# Patient Record
Sex: Male | Born: 1950 | Race: Black or African American | Hispanic: No | Marital: Single | Smoking: Former smoker
Health system: Southern US, Community
[De-identification: ages and names within clinical notes are randomized; demographics above are authoritative.]

## PROBLEM LIST (undated history)

## (undated) DIAGNOSIS — N1831 Chronic kidney disease, stage 3a: Secondary | ICD-10-CM

## (undated) DIAGNOSIS — I255 Ischemic cardiomyopathy: Secondary | ICD-10-CM

## (undated) DIAGNOSIS — I340 Nonrheumatic mitral (valve) insufficiency: Secondary | ICD-10-CM

## (undated) DIAGNOSIS — Z720 Tobacco use: Secondary | ICD-10-CM

## (undated) DIAGNOSIS — Z95810 Presence of automatic (implantable) cardiac defibrillator: Secondary | ICD-10-CM

## (undated) DIAGNOSIS — I519 Heart disease, unspecified: Secondary | ICD-10-CM

## (undated) DIAGNOSIS — I2510 Atherosclerotic heart disease of native coronary artery without angina pectoris: Secondary | ICD-10-CM

## (undated) DIAGNOSIS — B1920 Unspecified viral hepatitis C without hepatic coma: Secondary | ICD-10-CM

## (undated) DIAGNOSIS — I5020 Unspecified systolic (congestive) heart failure: Secondary | ICD-10-CM

## (undated) DIAGNOSIS — E119 Type 2 diabetes mellitus without complications: Secondary | ICD-10-CM

## (undated) HISTORY — PX: CIRCUMCISION: SUR203

## (undated) HISTORY — DX: Unspecified systolic (congestive) heart failure: I50.20

## (undated) HISTORY — DX: Heart disease, unspecified: I51.9

## (undated) HISTORY — DX: Unspecified viral hepatitis C without hepatic coma: B19.20

## (undated) HISTORY — DX: Atherosclerotic heart disease of native coronary artery without angina pectoris: I25.10

## (undated) HISTORY — DX: Nonrheumatic mitral (valve) insufficiency: I34.0

## (undated) HISTORY — DX: Tobacco use: Z72.0

## (undated) HISTORY — DX: Chronic kidney disease, stage 3a: N18.31

## (undated) HISTORY — DX: Type 2 diabetes mellitus without complications: E11.9

## (undated) HISTORY — DX: Ischemic cardiomyopathy: I25.5

---

## 2001-03-22 ENCOUNTER — Emergency Department (HOSPITAL_COMMUNITY): Admission: EM | Admit: 2001-03-22 | Discharge: 2001-03-22 | Payer: Self-pay | Admitting: Emergency Medicine

## 2001-03-22 ENCOUNTER — Encounter: Payer: Self-pay | Admitting: Emergency Medicine

## 2008-02-28 ENCOUNTER — Emergency Department: Payer: Self-pay | Admitting: Emergency Medicine

## 2009-09-26 ENCOUNTER — Ambulatory Visit: Payer: Self-pay | Admitting: General Practice

## 2011-10-20 ENCOUNTER — Encounter (HOSPITAL_COMMUNITY): Payer: Self-pay | Admitting: *Deleted

## 2011-10-20 ENCOUNTER — Emergency Department (HOSPITAL_COMMUNITY)
Admission: EM | Admit: 2011-10-20 | Discharge: 2011-10-20 | Disposition: A | Discharge status: Home / Self Care | Payer: 59 | Attending: Emergency Medicine | Admitting: Emergency Medicine

## 2011-10-20 ENCOUNTER — Emergency Department (HOSPITAL_COMMUNITY): Payer: 59

## 2011-10-20 DIAGNOSIS — R509 Fever, unspecified: Secondary | ICD-10-CM | POA: Insufficient documentation

## 2011-10-20 DIAGNOSIS — J40 Bronchitis, not specified as acute or chronic: Secondary | ICD-10-CM

## 2011-10-20 DIAGNOSIS — E119 Type 2 diabetes mellitus without complications: Secondary | ICD-10-CM | POA: Insufficient documentation

## 2011-10-20 MED ORDER — HYDROCOD POLST-CHLORPHEN POLST 10-8 MG/5ML PO LQCR
5.0000 mL | Freq: Once | ORAL | Status: AC
  Administered 2011-10-20: 5 mL via ORAL

## 2011-10-20 MED ORDER — HYDROCOD POLST-CHLORPHEN POLST 10-8 MG/5ML PO LQCR
ORAL | Status: AC
  Administered 2011-10-20: 5 mL via ORAL
  Filled 2011-10-20: qty 5

## 2011-10-20 MED ORDER — GUAIFENESIN-CODEINE 100-10 MG/5ML PO SYRP: ORAL_SOLUTION | ORAL | Status: DC

## 2011-10-20 NOTE — ED Notes (Addendum)
Pt c/o chills, fever, cough with sputum production with no color, no appetite, sore throat, and right side lower back pain,  pt states that he started getting sick week ago,

## 2011-10-20 NOTE — Discharge Instructions (Signed)
Bronchitis Bronchitis is the body's way of reacting to injury and/or infection (inflammation) of the bronchi. Bronchi are the air tubes that extend from the windpipe into the lungs. If the inflammation becomes severe, it may cause shortness of breath. CAUSES  Inflammation may be caused by:  A virus.   Germs (bacteria).   Dust.   Allergens.   Pollutants and many other irritants.  The cells lining the bronchial tree are covered with tiny hairs (cilia). These constantly beat upward, away from the lungs, toward the mouth. This keeps the lungs free of pollutants. When these cells become too irritated and are unable to do their job, mucus begins to develop. This causes the characteristic cough of bronchitis. The cough clears the lungs when the cilia are unable to do their job. Without either of these protective mechanisms, the mucus would settle in the lungs. Then you would develop pneumonia. Smoking is a common cause of bronchitis and can contribute to pneumonia. Stopping this habit is the single most important thing you can do to help yourself. TREATMENT   Your caregiver may prescribe an antibiotic if the cough is caused by bacteria. Also, medicines that open up your airways make it easier to breathe. Your caregiver may also recommend or prescribe an expectorant. It will loosen the mucus to be coughed up. Only take over-the-counter or prescription medicines for pain, discomfort, or fever as directed by your caregiver.   Removing whatever causes the problem (smoking, for example) is critical to preventing the problem from getting worse.   Cough suppressants may be prescribed for relief of cough symptoms.   Inhaled medicines may be prescribed to help with symptoms now and to help prevent problems from returning.   For those with recurrent (chronic) bronchitis, there may be a need for steroid medicines.  SEEK IMMEDIATE MEDICAL CARE IF:   During treatment, you develop more pus-like mucus  (purulent sputum).   You have a fever.   Your baby is older than 3 months with a rectal temperature of 102 F (38.9 C) or higher.   Your baby is 80 months old or younger with a rectal temperature of 100.4 F (38 C) or higher.   You become progressively more ill.   You have increased difficulty breathing, wheezing, or shortness of breath.  It is necessary to seek immediate medical care if you are elderly or sick from any other disease. MAKE SURE YOU:   Understand these instructions.   Will watch your condition.   Will get help right away if you are not doing well or get worse.  Document Released: 07/07/2005 Document Revised: 06/26/2011 Document Reviewed: 05/16/2008 [facility] Patient Information 2012 [place], [place].   The chest x-ray shows no signs of pneumonia.  Clinically you have bronchitis.  Take the cough medicine as directed.  Make an appt for follow up at the [facility] hospital.  Return here if your symptoms worsen or change in the meantime.

## 2011-10-20 NOTE — ED Provider Notes (Signed)
History/physical exam/procedure(s) were performed by non-physician practitioner and as supervising physician I was immediately available for consultation/collaboration. I have reviewed all notes and am in agreement with care and plan.   Hilario Quarry, MD 10/20/11 [phone]

## 2011-10-20 NOTE — ED Provider Notes (Signed)
History     CSN: [id number]  Arrival date & time 10/20/11  0930   First MD Initiated Contact with Patient 10/20/11 [phone]      Chief Complaint  Patient presents with  . Chills  . Cough  . Fever    (Consider location/radiation/quality/duration/timing/severity/associated sxs/prior treatment) HPI Comments: Cough with white sputum ~ 2 weeks.  Has not seen PCP at [facility] since last year.  Patient is a 61 y.o. male presenting with cough and fever. The history is provided by the patient. No language interpreter was used.  Cough This is a new problem. The problem occurs every few minutes. The problem has not changed since onset.The cough is productive of sputum. 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 His past medical history does not include COPD or emphysema.  Fever Primary symptoms of the febrile illness include fever and cough.    Past Medical History  Diagnosis Date  . Diabetes mellitus     History reviewed. No pertinent past surgical history.  No family history on file.  History  Substance Use Topics  . Smoking status: Current Some Day Smoker  . Smokeless tobacco: Not on file  . Alcohol Use: Yes     occassionally       Review of Systems  Constitutional: Positive for fever.  Respiratory: Positive for cough.     Allergies  Review of patient's allergies indicates no known allergies.  Home Medications   Current Outpatient Rx  Name Route Sig Dispense Refill  . INSULIN ISOPHANE & REGULAR (70-30) 100 UNIT/ML [place] SUSP Subcutaneous Inject 20-25 Units into the skin 2 (two) times daily with a meal.    . PHENYLEPH-CPM-DM-APAP 11-19-08-325 MG PO CAPS Oral Take 2 tablets by mouth every 6 (six) hours as needed. FOR COLD SYMPTOMS     . PSEUDOEPH-DOXYLAMINE-DM-APAP 60-7.12-17-998 MG/30ML PO LIQD Oral Take by mouth.      Pulse 93  Temp 98.6 F (37 C)  Resp 20  Ht 5\' 11"  (1.803 m)  Wt 180 lb (81.647 kg)  BMI 25.10 kg/m2  SpO2 97%  Physical Exam  Nursing  note and vitals reviewed. Constitutional: He is oriented to person, place, and time. He appears well-developed and well-nourished.  HENT:  Head: Normocephalic and atraumatic.  Eyes: EOM are normal.  Neck: Normal range of motion.  Cardiovascular: Normal rate, regular rhythm, normal heart sounds and intact distal pulses.   Pulmonary/Chest: Effort normal and breath sounds normal. No accessory muscle usage. Not tachypneic. No respiratory distress. He has no decreased breath sounds. He has no wheezes. He has no rhonchi. He has no rales. He exhibits no tenderness.  Abdominal: Soft. He exhibits no distension. There is no tenderness.  Musculoskeletal: Normal range of motion.  Neurological: He is alert and oriented to person, place, and time.  Skin: Skin is warm and dry.  Psychiatric: He has a normal mood and affect. Judgment normal.    ED Course  Procedures (including critical care time)  Labs Reviewed - No data to display Dg Chest 2 View  10/20/2011  *RADIOLOGY REPORT*  Clinical Data: Productive cough.  Fever.  Smoker.  Diabetes.  CHEST - 2 VIEW  Comparison: None.  Findings: No infiltrate, congestive heart failure or pneumothorax. Mild prominence right hilar region appears vascular in origin.  If the patient had a persistent unexplained cough, further imaging (CT) may be considered in this patient with history of smoking.  Heart size within normal limits.  Minimally tortuous aorta.  IMPRESSION: No  infiltrate or congestive heart failure.  Please see above.  Original Report Authenticated By: Fuller Canada, M.D.     No diagnosis found.    MDM  rx robitussin AC F/u with PCP Return if sxs worsen in meantime.        Worthy Rancher, PA 10/20/11 [phone]

## 2011-10-20 NOTE — ED Notes (Signed)
Pt DC to home with steady gait 

## 2014-03-23 ENCOUNTER — Encounter (INDEPENDENT_AMBULATORY_CARE_PROVIDER_SITE_OTHER): Payer: Self-pay | Admitting: *Deleted

## 2014-10-06 ENCOUNTER — Encounter (INDEPENDENT_AMBULATORY_CARE_PROVIDER_SITE_OTHER): Payer: Self-pay | Admitting: *Deleted

## 2014-10-26 ENCOUNTER — Ambulatory Visit (INDEPENDENT_AMBULATORY_CARE_PROVIDER_SITE_OTHER): Payer: Self-pay | Admitting: Internal Medicine

## 2014-10-27 ENCOUNTER — Ambulatory Visit (INDEPENDENT_AMBULATORY_CARE_PROVIDER_SITE_OTHER): Payer: Self-pay | Admitting: Internal Medicine

## 2014-11-10 ENCOUNTER — Encounter (INDEPENDENT_AMBULATORY_CARE_PROVIDER_SITE_OTHER): Payer: Self-pay | Admitting: *Deleted

## 2014-11-10 ENCOUNTER — Encounter (INDEPENDENT_AMBULATORY_CARE_PROVIDER_SITE_OTHER): Payer: Self-pay | Admitting: Internal Medicine

## 2014-11-10 ENCOUNTER — Ambulatory Visit (INDEPENDENT_AMBULATORY_CARE_PROVIDER_SITE_OTHER): Payer: 59 | Admitting: Internal Medicine

## 2014-11-10 VITALS — BP 124/78 | HR 80 | Temp 97.0°F | Ht 70.0 in | Wt 173.3 lb

## 2014-11-10 DIAGNOSIS — B1920 Unspecified viral hepatitis C without hepatic coma: Secondary | ICD-10-CM | POA: Insufficient documentation

## 2014-11-10 DIAGNOSIS — E119 Type 2 diabetes mellitus without complications: Secondary | ICD-10-CM | POA: Insufficient documentation

## 2014-11-10 LAB — CBC WITH DIFFERENTIAL/PLATELET
BASOS PCT: 0 % (ref 0–1)
Basophils Absolute: 0 10*3/uL (ref 0.0–0.1)
EOS ABS: 0.1 10*3/uL (ref 0.0–0.7)
Eosinophils Relative: 2 % (ref 0–5)
HEMATOCRIT: 42.8 % (ref 39.0–52.0)
Hemoglobin: 15.4 g/dL (ref 13.0–17.0)
Lymphocytes Relative: 53 % — ABNORMAL HIGH (ref 12–46)
Lymphs Abs: 3.8 10*3/uL (ref 0.7–4.0)
MCH: 32.7 pg (ref 26.0–34.0)
MCHC: 36 g/dL (ref 30.0–36.0)
MCV: 90.9 fL (ref 78.0–100.0)
MONO ABS: 0.6 10*3/uL (ref 0.1–1.0)
MONOS PCT: 9 % (ref 3–12)
MPV: 10.3 fL (ref 8.6–12.4)
NEUTROS PCT: 36 % — AB (ref 43–77)
Neutro Abs: 2.6 10*3/uL (ref 1.7–7.7)
Platelets: 202 10*3/uL (ref 150–400)
RBC: 4.71 MIL/uL (ref 4.22–5.81)
RDW: 13.4 % (ref 11.5–15.5)
WBC: 7.1 10*3/uL (ref 4.0–10.5)

## 2014-11-10 LAB — PROTIME-INR
INR: 0.93 (ref <1.50)
Prothrombin Time: 12.5 seconds (ref 11.6–15.2)

## 2014-11-10 NOTE — Progress Notes (Signed)
   Subjective:    Patient ID: Jose Clark, male    DOB: May 05, 1951, 64 y.o.   MRN: [id number]  HPI 64 yr old black male referred by [facility] by  Dr. Leonie Douglas for Hep C treatment.  Diagnosed with Hepatitis C seven years ago at the [facility] in [place]. He did start tx seven years ago but stopped because of depression. In the 70s, he did IV drugs. No tattoos.  He denies hx of jaundice. Appetite is good. No weight loss. There is no abdominal pain. Usually has a BM once a day. No melena or BRRB. Diabetic x 20 yrs.  03/22/2014 Hep C antibody reactive Hep C viral RNA qualitative detected. WBC 4.8, RBC 5.20, H and H 16.8 and 50.5, MCV 97.1, Albumin 4.4, Bilirubin total 0.7, ALP 77, AST 32, ALT 40. HA1C 10.9  Have you ever been treated for Hepatitis C?  Yes, but stopped tx due to depression Any hx of IV drug abuse or drug abuse? In the 70s.  No drugs in over 30 years.  Are you drinking now? In a weeks time, 4-5 beer Any hx of etoh abuse?  no Do you have tattoos?no Have you ever received a blood transfusion? no When were you diagnosed with Hepatitis C? 2009 Any hx of mental illness requiring treatment? no Do you have suicidal thoughts? no  Review of Systems Divorced. 4 children in good health.     Past Medical History  Diagnosis Date  . Diabetes mellitus   . Hepatitis C   . Diabetes     x 20 yrs    No past surgical history on file.  No Known Allergies  Current Outpatient Prescriptions on File Prior to Visit  Medication Sig Dispense Refill  . insulin NPH-insulin regular (NOVOLIN 70/30) (70-30) 100 UNIT/ML injection Inject 20-25 Units into the skin 2 (two) times daily with a meal.     No current facility-administered medications on file prior to visit.     Objective:   Physical Exam Blood pressure 124/78, pulse 80, temperature 97 F (36.1 C), height 5\' 10"  (1.778 m), weight 173 lb 4.8 oz (78.608 kg).  Alert and oriented. Skin warm and dry. Oral  mucosa is moist.   . Sclera anicteric, conjunctivae is pink. Thyroid not enlarged. No cervical lymphadenopathy. Lungs clear. Heart regular rate and rhythm.  Abdomen is soft. Bowel sounds are positive. No hepatomegaly. No abdominal masses felt. No tenderness.  No edema to lower extremities.         Assessment & Plan:  Hepatitis C. Transaminases are normal.   Desires tx. CBC with diff, Hepatic function, PT/INR, TSH, Hep C quaint, Hep C genotype, [place] elastrography

## 2014-11-10 NOTE — Patient Instructions (Signed)
Labs. OV in 4 weeks

## 2014-11-11 LAB — TSH: TSH: 1.271 u[IU]/mL (ref 0.350–4.500)

## 2014-11-11 LAB — HEPATIC FUNCTION PANEL
ALK PHOS: 61 U/L (ref 39–117)
ALT: 25 U/L (ref 0–53)
AST: 23 U/L (ref 0–37)
Albumin: 3.6 g/dL (ref 3.5–5.2)
BILIRUBIN INDIRECT: 0.2 mg/dL (ref 0.2–1.2)
BILIRUBIN TOTAL: 0.3 mg/dL (ref 0.2–1.2)
Bilirubin, Direct: 0.1 mg/dL (ref 0.0–0.3)
Total Protein: 6.3 g/dL (ref 6.0–8.3)

## 2014-11-13 LAB — HEPATITIS C RNA QUANTITATIVE
HCV QUANT: 7686655 [IU]/mL — AB (ref <15)
HCV Quantitative Log: 6.89 {Log} — ABNORMAL HIGH (ref <1.18)

## 2014-11-15 ENCOUNTER — Telehealth (INDEPENDENT_AMBULATORY_CARE_PROVIDER_SITE_OTHER): Payer: Self-pay | Admitting: Internal Medicine

## 2014-11-15 ENCOUNTER — Ambulatory Visit (HOSPITAL_COMMUNITY)
Admission: RE | Admit: 2014-11-15 | Discharge: 2014-11-15 | Disposition: A | Discharge status: Home / Self Care | Payer: 59 | Source: Ambulatory Visit | Attending: Internal Medicine | Admitting: Internal Medicine

## 2014-11-15 DIAGNOSIS — B1920 Unspecified viral hepatitis C without hepatic coma: Secondary | ICD-10-CM | POA: Insufficient documentation

## 2014-11-15 LAB — HEPATITIS C GENOTYPE

## 2014-11-15 NOTE — Telephone Encounter (Signed)
Jose Clark, Results given to patient. We are going to treat with Harvoni x 12 weeks. Insurance may only pay for 8 weeks. Genotype 1A.

## 2014-11-16 NOTE — Telephone Encounter (Signed)
Paper work and PA has been sent to [employer] for assistance with PA for Harvoini.

## 2014-11-17 ENCOUNTER — Telehealth (INDEPENDENT_AMBULATORY_CARE_PROVIDER_SITE_OTHER): Payer: Self-pay | Admitting: Internal Medicine

## 2014-11-17 DIAGNOSIS — B1920 Unspecified viral hepatitis C without hepatic coma: Secondary | ICD-10-CM

## 2014-11-17 NOTE — Telephone Encounter (Signed)
I have spoken with patient. He will go to lab for urine drug screen.

## 2014-11-17 NOTE — Telephone Encounter (Signed)
error 

## 2014-11-21 ENCOUNTER — Telehealth (INDEPENDENT_AMBULATORY_CARE_PROVIDER_SITE_OTHER): Payer: Self-pay | Admitting: Internal Medicine

## 2014-11-21 LAB — DRUG SCREEN, URINE
Amphetamine Screen, Ur: NEGATIVE
BENZODIAZEPINES.: NEGATIVE
Barbiturate Quant, Ur: NEGATIVE
COCAINE METABOLITES: POSITIVE — AB
CREATININE, U: 408.78 mg/dL
Marijuana Metabolite: NEGATIVE
Methadone: NEGATIVE
OPIATES: NEGATIVE
PHENCYCLIDINE (PCP): NEGATIVE
Propoxyphene: NEGATIVE

## 2014-11-21 NOTE — Telephone Encounter (Signed)
Message left

## 2014-11-23 ENCOUNTER — Encounter (INDEPENDENT_AMBULATORY_CARE_PROVIDER_SITE_OTHER): Payer: Self-pay | Admitting: *Deleted

## 2015-05-25 ENCOUNTER — Ambulatory Visit (INDEPENDENT_AMBULATORY_CARE_PROVIDER_SITE_OTHER): Payer: 59 | Admitting: Internal Medicine

## 2015-05-28 ENCOUNTER — Encounter (INDEPENDENT_AMBULATORY_CARE_PROVIDER_SITE_OTHER): Payer: Self-pay | Admitting: Internal Medicine

## 2015-05-28 ENCOUNTER — Ambulatory Visit (INDEPENDENT_AMBULATORY_CARE_PROVIDER_SITE_OTHER): Payer: 59 | Admitting: Internal Medicine

## 2015-05-28 VITALS — BP 102/54 | HR 64 | Temp 98.1°F | Ht 71.0 in | Wt 181.5 lb

## 2015-05-28 DIAGNOSIS — B1710 Acute hepatitis C without hepatic coma: Secondary | ICD-10-CM

## 2015-05-28 NOTE — Patient Instructions (Signed)
OV pending.  

## 2015-05-28 NOTE — Progress Notes (Addendum)
Subjective:    Patient ID: Jose Clark, male    DOB: 1950/08/23, 64 y.o.   MRN: [id number]  HPI Here today for f/u of his Hepatitis C. He was last seen in April of this year.  Referred by Dr. Leonie Douglas of [facility] for Hep C. Tx. Diagnosed 7 years ago at the [place] [facility] in [place].  He did start tx seven years ago but stopped due to depression.  States he has never did IV drugs at this office visit. . No tattoos.  Genotype 1A.  At office visit he tested positive for cocaine. He was put on hold for 6 months.  11/15/2014 [place] Elast: F2 F3 Appetite is good.  No weight loss. No nausea or vomiting. Usually has a BM daily.   Has not received the Hepatitis A or B vaccine.   Drugs of Abuse     Component Value Date/Time   LABOPIA NEG 11/20/2014 0939   COCAINSCRNUR POS* 11/20/2014 0939   LABBENZ NEG 11/20/2014 0939   AMPHETMU NEG 11/20/2014 0939      CBC    Component Value Date/Time   WBC 7.1 11/10/2014 0934   RBC 4.71 11/10/2014 0934   HGB 15.4 11/10/2014 0934   HCT 42.8 11/10/2014 0934   PLT 202 11/10/2014 0934   MCV 90.9 11/10/2014 0934   MCH 32.7 11/10/2014 0934   MCHC 36.0 11/10/2014 0934   RDW 13.4 11/10/2014 0934   LYMPHSABS 3.8 11/10/2014 0934   MONOABS 0.6 11/10/2014 0934   EOSABS 0.1 11/10/2014 0934   BASOSABS 0.0 11/10/2014 0934    Hepatic Function Panel     Component Value Date/Time   PROT 6.3 11/10/2014 0934   ALBUMIN 3.6 11/10/2014 0934   AST 23 11/10/2014 0934   ALT 25 11/10/2014 0934   ALKPHOS 61 11/10/2014 0934   BILITOT 0.3 11/10/2014 0934   BILIDIR 0.1 11/10/2014 0934   IBILI 0.2 11/10/2014 0934         03/22/2014 Hep C antibody reactive Hep C viral RNA qualitative detected. WBC 4.8, RBC 5.20, H and H 16.8 and 50.5, MCV 97.1, Albumin 4.4, Bilirubin total 0.7, ALP 77, AST 32, ALT 40. HA1C 10.9  Have you ever been treated for Hepatitis C? Yes, but stopped tx due to depression Any hx of IV drug abuse or drug abuse? In  the 70s. No drugs in over 30 years. At this OV 05/28/2015 he denies IV drug abuse. Are you drinking now? In a weeks time, 4-5 beer Any hx of etoh abuse? no Do you have tattoos?no Have you ever received a blood transfusion? no When were you diagnosed with Hepatitis C? 2009 Any hx of mental illness requiring treatment? no Do you have suicidal thoughts? no      Review of Systems Past Medical History  Diagnosis Date  . Diabetes mellitus   . Hepatitis C   . Diabetes ([place])     x 20 yrs    No past surgical history on file.  No Known Allergies  Current Outpatient Prescriptions on File Prior to Visit  Medication Sig Dispense Refill  . insulin NPH-insulin regular (NOVOLIN 70/30) (70-30) 100 UNIT/ML injection Inject 20-25 Units into the skin 2 (two) times daily with a meal.    . lisinopril (PRINIVIL,ZESTRIL) 5 MG tablet Take 5 mg by mouth daily.    . metFORMIN (GLUCOPHAGE) 500 MG tablet Take by mouth 2 (two) times daily with a meal.     No current facility-administered medications  on file prior to visit.        Objective:   Physical Exam Blood pressure 102/54, pulse 64, temperature 98.1 F (36.7 C), height 5\' 11"  (1.803 m), weight 181 lb 8 oz (82.328 kg).  Alert and oriented. Skin warm and dry. Oral mucosa is moist.   . Sclera anicteric, conjunctivae is pink. Thyroid not enlarged. No cervical lymphadenopathy. Lungs clear. Heart regular rate and rhythm.  Abdomen is soft. Bowel sounds are positive. No hepatomegaly. No abdominal masses felt. No tenderness.  No edema to lower extremities.        Assessment & Plan:  Hepatitis C. Will get a Hep C quaint.and Urine drug screen. Further recommendations to follow.  Rx for Hep A an B vaccine given to patient. OV in 3 months.

## 2015-05-29 LAB — DRUG SCREEN, URINE
Amphetamine Screen, Ur: NEGATIVE
BENZODIAZEPINES.: NEGATIVE
Barbiturate Quant, Ur: NEGATIVE
CREATININE, U: 229.32 mg/dL
Cocaine Metabolites: NEGATIVE
Marijuana Metabolite: NEGATIVE
Methadone: NEGATIVE
Opiates: NEGATIVE
PHENCYCLIDINE (PCP): NEGATIVE
PROPOXYPHENE: NEGATIVE

## 2015-05-29 LAB — HEPATITIS C RNA QUANTITATIVE
HCV QUANT LOG: 6.79 {Log} — AB (ref <1.18)
HCV QUANT: 6112923 [IU]/mL — AB (ref <15)

## 2015-05-30 ENCOUNTER — Telehealth (INDEPENDENT_AMBULATORY_CARE_PROVIDER_SITE_OTHER): Payer: Self-pay | Admitting: Internal Medicine

## 2015-05-30 NOTE — Telephone Encounter (Signed)
Jose Clark, We are going to treat with Harvoni x 12 weeks. Hep C .patient

## 2015-05-31 NOTE — Telephone Encounter (Signed)
Paper work has been completed and will be faxed to the [facility] for assistance with the PA for HEP C Medication. Patient will be made aware of the status as we are.

## 2015-06-25 ENCOUNTER — Telehealth (INDEPENDENT_AMBULATORY_CARE_PROVIDER_SITE_OTHER): Payer: Self-pay | Admitting: *Deleted

## 2015-06-25 NOTE — Telephone Encounter (Signed)
Patient called in stating he still has not received his medication in the mail.  Would like for you to call him please

## 2015-06-25 NOTE — Telephone Encounter (Signed)
I spoke with patient. May take insurance co up to 6 weeks.

## 2015-06-27 ENCOUNTER — Encounter (INDEPENDENT_AMBULATORY_CARE_PROVIDER_SITE_OTHER): Payer: Self-pay | Admitting: *Deleted

## 2015-07-02 ENCOUNTER — Telehealth (INDEPENDENT_AMBULATORY_CARE_PROVIDER_SITE_OTHER): Payer: Self-pay | Admitting: *Deleted

## 2015-07-02 NOTE — Telephone Encounter (Signed)
Rec'd  Prior Authorization is approved through 09/17/2015. PA was completed through [place].They have sent the prescription to the dispensing pharmacy , [facility]. I called to verify that they had rec'd it and that it was valid. As sometime they refer to it as a 3rd party and perfer either a verbal or a written prescription.  The representative ask that we call back in 24 hours as she could see that the pharamacy was putting it in. It was noted that it might be Wednesday before we could follow up.

## 2015-07-05 NOTE — Telephone Encounter (Signed)
Talked with the [place], they stated that the Harvoni is ready to be shipped out to the patient. They are going to call him or he could call them. I spoke with the patient and provided the number , [phone] , for him to call. He states that he is going to call them and he will let us know when he gets the medication/starts so that we can arrange his office appointments and labs.

## 2015-07-24 ENCOUNTER — Encounter (INDEPENDENT_AMBULATORY_CARE_PROVIDER_SITE_OTHER): Payer: Self-pay | Admitting: *Deleted

## 2015-07-24 ENCOUNTER — Telehealth (INDEPENDENT_AMBULATORY_CARE_PROVIDER_SITE_OTHER): Payer: Self-pay | Admitting: Internal Medicine

## 2015-07-24 DIAGNOSIS — B182 Chronic viral hepatitis C: Secondary | ICD-10-CM

## 2015-07-24 NOTE — Telephone Encounter (Signed)
[name] called saying he received his medication and will begin taking it today. He said you told him to let you know of this.  Pt's ph# [phone] Thank you.

## 2015-07-24 NOTE — Telephone Encounter (Signed)
Needs OV in 4 weeks.  Jose Clark, CBC, Hepatic function, Hep C quaint in 4 weeks.

## 2015-07-24 NOTE — Telephone Encounter (Signed)
Labs are noted to be drawn on or around 08/21/2015. Letter has been sent to the patient as a reminder.

## 2015-07-25 NOTE — Telephone Encounter (Signed)
appt 08/28/15

## 2015-08-22 ENCOUNTER — Telehealth (INDEPENDENT_AMBULATORY_CARE_PROVIDER_SITE_OTHER): Payer: Self-pay | Admitting: Internal Medicine

## 2015-08-22 NOTE — Telephone Encounter (Signed)
Please send this to Tammy, I am not familiar with these pharmacy so she can call him.

## 2015-08-22 NOTE — Telephone Encounter (Signed)
Mr. Jose Clark called saying he took his last pill (wasn't sure of the name of the medication but said it was for Hepatitis C) on Saturday. He needs a refill. He said he thinks he uses the mail order service pharmacy for [facility] [employer]. Please give him a call regarding this if needed.  Pt's ph# [phone] Thank you.

## 2015-08-28 ENCOUNTER — Ambulatory Visit (INDEPENDENT_AMBULATORY_CARE_PROVIDER_SITE_OTHER): Payer: 59 | Admitting: Internal Medicine

## 2015-08-30 NOTE — Telephone Encounter (Signed)
Message left on answering machine 

## 2015-08-30 NOTE — Telephone Encounter (Signed)
This is for Jose Clark 

## 2015-08-30 NOTE — Telephone Encounter (Signed)
Jose Clark - I sent this to you. Wanted to keep you informed on this patient's status.

## 2015-08-30 NOTE — Telephone Encounter (Signed)
[place], spoke with Amy. She states that the 1 shipment went out in December. There is a Rx awaiting to be sent to the patient and he needs to call them [phone]. His current PA is approved through 09/17/2015. Amy says it looks like on her end , that he has had 56 days worth. Also , the patient has not had his labs drawn, they were due 07/23/2015. A letter was sent to him as reminder.  I have called the patient and a detailed message was left about his medication and the labs. He was ask to call our office to verify he got this message. Forwarded to [place] for review.

## 2015-09-03 ENCOUNTER — Ambulatory Visit (INDEPENDENT_AMBULATORY_CARE_PROVIDER_SITE_OTHER): Payer: 59 | Admitting: Internal Medicine

## 2015-09-12 ENCOUNTER — Ambulatory Visit (INDEPENDENT_AMBULATORY_CARE_PROVIDER_SITE_OTHER): Payer: 59 | Admitting: Internal Medicine

## 2015-09-12 ENCOUNTER — Encounter (INDEPENDENT_AMBULATORY_CARE_PROVIDER_SITE_OTHER): Payer: Self-pay | Admitting: Internal Medicine

## 2015-09-12 VITALS — BP 126/74 | HR 64 | Temp 98.1°F | Ht 71.0 in | Wt 186.9 lb

## 2015-09-12 DIAGNOSIS — B179 Acute viral hepatitis, unspecified: Secondary | ICD-10-CM

## 2015-09-12 LAB — CBC
HCT: 43.4 % (ref 39.0–52.0)
HEMOGLOBIN: 14.9 g/dL (ref 13.0–17.0)
MCH: 32.3 pg (ref 26.0–34.0)
MCHC: 34.3 g/dL (ref 30.0–36.0)
MCV: 94.1 fL (ref 78.0–100.0)
MPV: 10 fL (ref 8.6–12.4)
Platelets: 173 10*3/uL (ref 150–400)
RBC: 4.61 MIL/uL (ref 4.22–5.81)
RDW: 13.1 % (ref 11.5–15.5)
WBC: 5.2 10*3/uL (ref 4.0–10.5)

## 2015-09-12 NOTE — Progress Notes (Signed)
   Subjective:    Patient ID: Jose Clark, male    DOB: 09-21-1950, 65 y.o.   MRN: [id number]  HPI Here today for f/u of his Hepatitis C. He started [facility] 07/24/2015. This is week 5 for him. He had a lapse of 2 weeks due to not calling the pharmacy. He did have his lab drawn this morning before coming into office. He is Genotype 1A.  Referred by [facility] by Dr. Leonie Douglas for Hep C treatment.  Diagnosed with Hepatitis C seven years ago at the [facility] in [place]. He did start tx seven years ago but stopped because of depression. In the 70s, he did IV drugs. No tattoos.  He denies hx of jaundice.  Rx for Hepatitis A and B given to him at last OV but he has not started. He will take the Rx to [facility] next OV. 11/15/2014 [place] elast. Corresponding Metavir fibrosis score: F2/F3 Diabetic x 20 years. Blood sugars running around 90-130 He does occasionally have headaches since starting  Harvoni.  Appetite is good. He has gained about 6 pounds since November. BMs are normal. No melena or BRRB.   Review of Systems Past Medical History  Diagnosis Date  . Diabetes mellitus   . Hepatitis C   . Diabetes ([place])     x 20 yrs    No past surgical history on file.  No Known Allergies  Past Medical History  Diagnosis Date  . Diabetes mellitus   . Hepatitis C   . Diabetes ([place])     x 20 yrs    No past surgical history on file.  No Known Allergies  Current Outpatient Prescriptions on File Prior to Visit  Medication Sig Dispense Refill  . insulin NPH-insulin regular (NOVOLIN 70/30) (70-30) 100 UNIT/ML injection Inject 20-25 Units into the skin 2 (two) times daily with a meal.    . lisinopril (PRINIVIL,ZESTRIL) 5 MG tablet Take 5 mg by mouth daily.     No current facility-administered medications on file prior to visit.  Harvoni one a day.             Physical Exam  Blood pressure 126/74, pulse 64, temperature 98.1 F (36.7 C),  height 5\' 11"  (1.803 m), weight 186 lb 14.4 oz (84.777 kg). Alert and oriented. Skin warm and dry. Oral mucosa is moist.   . Sclera anicteric, conjunctivae is pink. Thyroid not enlarged. No cervical lymphadenopathy. Lungs clear. Heart regular rate and rhythm.  Abdomen is soft. Bowel sounds are positive. No hepatomegaly. No abdominal masses felt. No tenderness.  No edema to lower extremities. Patient is alert and oriented.        Assessment & Plan:  Hepatitis C. OV in 8 weeks with a CBC, Hepatic function, and Hep C quaint.  Will call with results of his lab when they are ready.

## 2015-09-12 NOTE — Patient Instructions (Signed)
OV in 8 weeks with labs. Go to lab 3 days before OV.

## 2015-09-13 LAB — HEPATIC FUNCTION PANEL
ALK PHOS: 69 U/L (ref 40–115)
ALT: 16 U/L (ref 9–46)
AST: 20 U/L (ref 10–35)
Albumin: 3.6 g/dL (ref 3.6–5.1)
BILIRUBIN DIRECT: 0.1 mg/dL (ref <=0.2)
Indirect Bilirubin: 0.3 mg/dL (ref 0.2–1.2)
Total Bilirubin: 0.4 mg/dL (ref 0.2–1.2)
Total Protein: 6.4 g/dL (ref 6.1–8.1)

## 2015-09-14 LAB — HEPATITIS C RNA QUANTITATIVE
HCV Quantitative Log: 4.47 {Log} — ABNORMAL HIGH (ref <1.18)
HCV Quantitative: 29800 IU/mL — ABNORMAL HIGH (ref <15)

## 2015-09-17 ENCOUNTER — Other Ambulatory Visit (INDEPENDENT_AMBULATORY_CARE_PROVIDER_SITE_OTHER): Payer: Self-pay | Admitting: *Deleted

## 2015-09-17 ENCOUNTER — Encounter (INDEPENDENT_AMBULATORY_CARE_PROVIDER_SITE_OTHER): Payer: Self-pay | Admitting: *Deleted

## 2015-09-17 ENCOUNTER — Telehealth (INDEPENDENT_AMBULATORY_CARE_PROVIDER_SITE_OTHER): Payer: Self-pay | Admitting: *Deleted

## 2015-09-17 DIAGNOSIS — B182 Chronic viral hepatitis C: Secondary | ICD-10-CM

## 2015-09-17 NOTE — Telephone Encounter (Signed)
.  Per Lelon Perla patient to have lab work in 3 weeks.

## 2015-10-09 ENCOUNTER — Telehealth (INDEPENDENT_AMBULATORY_CARE_PROVIDER_SITE_OTHER): Payer: Self-pay | Admitting: Internal Medicine

## 2015-10-09 NOTE — Telephone Encounter (Signed)
Jose Clark called saying he needs a prior authorization for Harvoni. Please send information to his insurance carrier and let him know when it's been completed. He wants the medication mailed to his home.   Pt's ph# [phone] Thank you.

## 2015-10-17 NOTE — Telephone Encounter (Signed)
Jose Clark , from [employer] has resubmitted a PA for a 4 week continuation of the Hep C Medication. We will be made aware of the outcome.

## 2015-10-17 NOTE — Telephone Encounter (Signed)
I have reached out to [employer] for this patient.

## 2015-10-18 ENCOUNTER — Encounter (INDEPENDENT_AMBULATORY_CARE_PROVIDER_SITE_OTHER): Payer: Self-pay | Admitting: *Deleted

## 2015-10-30 ENCOUNTER — Telehealth (INDEPENDENT_AMBULATORY_CARE_PROVIDER_SITE_OTHER): Payer: Self-pay | Admitting: *Deleted

## 2015-10-30 NOTE — Telephone Encounter (Signed)
Patient was called and made aware that we had rec'd a fax from [facility]. It states that the medication was previously approved. You will be able to fill a prescription for this medication at your pharmacy. If your pharmacy has questions reqarding the prescription of your prescription,please have them call [facility] pharmacy help desk at [phone].  He says that he will call them as they deliver his medication to him.

## 2015-11-12 ENCOUNTER — Ambulatory Visit (INDEPENDENT_AMBULATORY_CARE_PROVIDER_SITE_OTHER): Payer: 59 | Admitting: Internal Medicine

## 2015-11-14 ENCOUNTER — Encounter (INDEPENDENT_AMBULATORY_CARE_PROVIDER_SITE_OTHER): Payer: Self-pay | Admitting: *Deleted

## 2015-11-14 ENCOUNTER — Encounter (INDEPENDENT_AMBULATORY_CARE_PROVIDER_SITE_OTHER): Payer: Self-pay | Admitting: Internal Medicine

## 2015-11-14 ENCOUNTER — Other Ambulatory Visit (INDEPENDENT_AMBULATORY_CARE_PROVIDER_SITE_OTHER): Payer: Self-pay | Admitting: *Deleted

## 2015-11-14 ENCOUNTER — Ambulatory Visit (INDEPENDENT_AMBULATORY_CARE_PROVIDER_SITE_OTHER): Payer: 59 | Admitting: Internal Medicine

## 2015-11-14 ENCOUNTER — Other Ambulatory Visit (INDEPENDENT_AMBULATORY_CARE_PROVIDER_SITE_OTHER): Payer: Self-pay | Admitting: Internal Medicine

## 2015-11-14 VITALS — BP 118/80 | HR 76 | Temp 98.0°F | Ht 71.0 in | Wt 184.0 lb

## 2015-11-14 DIAGNOSIS — Z1211 Encounter for screening for malignant neoplasm of colon: Secondary | ICD-10-CM

## 2015-11-14 DIAGNOSIS — B1920 Unspecified viral hepatitis C without hepatic coma: Secondary | ICD-10-CM | POA: Diagnosis not present

## 2015-11-14 LAB — CBC WITH DIFFERENTIAL/PLATELET
BASOS PCT: 0 %
Basophils Absolute: 0 cells/uL (ref 0–200)
EOS ABS: 122 {cells}/uL (ref 15–500)
EOS PCT: 2 %
HCT: 43.2 % (ref 38.5–50.0)
Hemoglobin: 14.8 g/dL (ref 13.2–17.1)
LYMPHS PCT: 48 %
Lymphs Abs: 2928 cells/uL (ref 850–3900)
MCH: 32.2 pg (ref 27.0–33.0)
MCHC: 34.3 g/dL (ref 32.0–36.0)
MCV: 93.9 fL (ref 80.0–100.0)
MONOS PCT: 9 %
MPV: 9.5 fL (ref 7.5–12.5)
Monocytes Absolute: 549 cells/uL (ref 200–950)
NEUTROS ABS: 2501 {cells}/uL (ref 1500–7800)
Neutrophils Relative %: 41 %
PLATELETS: 175 10*3/uL (ref 140–400)
RBC: 4.6 MIL/uL (ref 4.20–5.80)
RDW: 13.6 % (ref 11.0–15.0)
WBC: 6.1 10*3/uL (ref 3.8–10.8)

## 2015-11-14 LAB — HEPATIC FUNCTION PANEL
ALBUMIN: 3.7 g/dL (ref 3.6–5.1)
ALK PHOS: 70 U/L (ref 40–115)
ALT: 12 U/L (ref 9–46)
AST: 12 U/L (ref 10–35)
BILIRUBIN INDIRECT: 0.2 mg/dL (ref 0.2–1.2)
BILIRUBIN TOTAL: 0.3 mg/dL (ref 0.2–1.2)
Bilirubin, Direct: 0.1 mg/dL (ref <=0.2)
TOTAL PROTEIN: 6.9 g/dL (ref 6.1–8.1)

## 2015-11-14 MED ORDER — PEG 3350-KCL-NA BICARB-NACL 420 G PO SOLR: 4000.0000 mL | Freq: Once | ORAL | Status: DC

## 2015-11-14 NOTE — Patient Instructions (Addendum)
Labs and [place]. OV in 6 months.  Screening colonoscopy. The risks and benefits such as perforation, bleeding, and infection were reviewed with the patient and is agreeable.

## 2015-11-14 NOTE — Progress Notes (Signed)
   Subjective:    Patient ID: Jose Clark, male    DOB: July 27, 1950, 65 y.o.   MRN: [id number] Patient states he is not hard to sedate. Has not did cocaine in over 6 months.  HPI  Here today for f/u of his Hepatitis C. Genotype 1A. Started Harvoni 07/24/2015.  He had a lapse in tx due to not calling pharmacy for refill on Harvoni. He says he has about 1 week left on the Harvoni.  Diagnosed with Hepatitis C  seven years ago at the [place] [facility] in [place].  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 Ix of IV drugs. Rx for Hepatitis A and B given to patient.  09/12/2015 Hep C quaint 2800 11/15/2014 [place] elast. Corresponding Metavir fibrosis score:  F2/F3 He tells me he is doing good. He is not doing cocaine. Appetite is good. He has gained 3 pounds. BMs are normal. No side effects from the [place]. Patient also states it has been greater than 10 yrs for his colonoscopy. Normal per patient. Done at the [place] in [facility].    Review of Systems Past Medical History  Diagnosis Date  . Diabetes mellitus   . Hepatitis C   . Diabetes ([place])     x 20 yrs    No past surgical history on file.  No Known Allergies  Current Outpatient Prescriptions on File Prior to Visit  Medication Sig Dispense Refill  . insulin NPH-insulin regular (NOVOLIN 70/30) (70-30) 100 UNIT/ML injection Inject 20-25 Units into the skin 2 (two) times daily with a meal.    . Ledipasvir-Sofosbuvir (HARVONI PO) Take by mouth.    [name] Kitchen lisinopril (PRINIVIL,ZESTRIL) 5 MG tablet Take 5 mg by mouth daily.     No current facility-administered medications on file prior to visit.        Objective:   Physical Exam Blood pressure 118/80, pulse 76, temperature 98 F (36.7 C), height 5\' 11"  (1.803 m), weight 184 lb (83.462 kg).  Alert and oriented. Skin warm and dry. Oral mucosa is moist.   . Sclera anicteric, conjunctivae is pink. Thyroid not enlarged. No cervical lymphadenopathy. Lungs clear. Heart regular rate and rhythm.  Abdomen is soft. Bowel  sounds are positive. No hepatomegaly. No abdominal masses felt. No tenderness.  No edema to lower extremities.         Assessment & Plan:  Hepatitis C. He has not cleared the virus as of last week. Will check Hepatitis C quaint, Hepatic function and CBC [place] RUQ.  Rx for Hepatitis A and B given to patient (patient lost first Rx given to him). Screening colonoscopy. The risks and benefits such as perforation, bleeding, and infection were reviewed with the patient and is agreeable.

## 2015-11-14 NOTE — Telephone Encounter (Signed)
Patient needs trilyte 

## 2015-11-15 LAB — HEPATITIS C RNA QUANTITATIVE: HCV Quantitative: NOT DETECTED IU/mL (ref <15)

## 2015-11-16 ENCOUNTER — Ambulatory Visit (HOSPITAL_COMMUNITY)
Admission: RE | Admit: 2015-11-16 | Discharge: 2015-11-16 | Disposition: A | Discharge status: Home / Self Care | Payer: 59 | Source: Ambulatory Visit | Attending: Internal Medicine | Admitting: Internal Medicine

## 2015-11-16 DIAGNOSIS — B1920 Unspecified viral hepatitis C without hepatic coma: Secondary | ICD-10-CM | POA: Insufficient documentation

## 2016-01-24 ENCOUNTER — Encounter (HOSPITAL_COMMUNITY)
Admission: RE | Disposition: A | Discharge status: Home / Self Care | Payer: Self-pay | Source: Ambulatory Visit | Attending: Internal Medicine

## 2016-01-24 ENCOUNTER — Encounter (HOSPITAL_COMMUNITY): Payer: Self-pay

## 2016-01-24 ENCOUNTER — Ambulatory Visit (HOSPITAL_COMMUNITY)
Admission: RE | Admit: 2016-01-24 | Discharge: 2016-01-24 | Disposition: A | Discharge status: Home / Self Care | Payer: 59 | Source: Ambulatory Visit | Attending: Internal Medicine | Admitting: Internal Medicine

## 2016-01-24 DIAGNOSIS — D123 Benign neoplasm of transverse colon: Secondary | ICD-10-CM | POA: Insufficient documentation

## 2016-01-24 DIAGNOSIS — E119 Type 2 diabetes mellitus without complications: Secondary | ICD-10-CM | POA: Insufficient documentation

## 2016-01-24 DIAGNOSIS — Z1211 Encounter for screening for malignant neoplasm of colon: Secondary | ICD-10-CM | POA: Insufficient documentation

## 2016-01-24 DIAGNOSIS — Z794 Long term (current) use of insulin: Secondary | ICD-10-CM | POA: Diagnosis not present

## 2016-01-24 DIAGNOSIS — K644 Residual hemorrhoidal skin tags: Secondary | ICD-10-CM | POA: Insufficient documentation

## 2016-01-24 DIAGNOSIS — K5730 Diverticulosis of large intestine without perforation or abscess without bleeding: Secondary | ICD-10-CM | POA: Diagnosis not present

## 2016-01-24 DIAGNOSIS — B1920 Unspecified viral hepatitis C without hepatic coma: Secondary | ICD-10-CM | POA: Diagnosis not present

## 2016-01-24 DIAGNOSIS — Z79899 Other long term (current) drug therapy: Secondary | ICD-10-CM | POA: Diagnosis not present

## 2016-01-24 HISTORY — PX: COLONOSCOPY: SHX5424

## 2016-01-24 LAB — GLUCOSE, CAPILLARY: GLUCOSE-CAPILLARY: 150 mg/dL — AB (ref 65–99)

## 2016-01-24 SURGERY — COLONOSCOPY
Anesthesia: Moderate Sedation

## 2016-01-24 MED ORDER — MIDAZOLAM HCL 5 MG/5ML IJ SOLN
INTRAMUSCULAR | Status: AC
  Filled 2016-01-24: qty 10

## 2016-01-24 MED ORDER — MEPERIDINE HCL 50 MG/ML IJ SOLN
INTRAMUSCULAR | Status: DC | PRN
  Administered 2016-01-24 (×2): 25 mg via INTRAVENOUS

## 2016-01-24 MED ORDER — SODIUM CHLORIDE 0.9 % IV SOLN
INTRAVENOUS | Status: DC
  Administered 2016-01-24: 09:00:00 via INTRAVENOUS

## 2016-01-24 MED ORDER — MEPERIDINE HCL 50 MG/ML IJ SOLN
INTRAMUSCULAR | Status: AC
  Filled 2016-01-24: qty 1

## 2016-01-24 MED ORDER — STERILE WATER FOR IRRIGATION IR SOLN
Status: DC | PRN
  Administered 2016-01-24: 10:00:00

## 2016-01-24 MED ORDER — MIDAZOLAM HCL 5 MG/5ML IJ SOLN
INTRAMUSCULAR | Status: DC | PRN
  Administered 2016-01-24: 2 mg via INTRAVENOUS
  Administered 2016-01-24: 3 mg via INTRAVENOUS

## 2016-01-24 NOTE — Discharge Instructions (Signed)
Resume usual medications and high fiber diet. No driving for 24 hours. Physician will call with biopsy results. Colonoscopy, Care After Refer to this sheet in the next few weeks. These instructions provide you with information on caring for yourself after your procedure. Your health care provider may also give you more specific instructions. Your treatment has been planned according to current medical practices, but problems sometimes occur. Call your health care provider if you have any problems or questions after your procedure. WHAT TO EXPECT AFTER THE PROCEDURE  After your procedure, it is typical to have the following:  A small amount of blood in your stool.  Moderate amounts of gas and mild abdominal cramping or bloating. HOME CARE INSTRUCTIONS  Do not drive, operate machinery, or sign important documents for 24 hours.  You may shower and resume your regular physical activities, but move at a slower pace for the first 24 hours.  Take frequent rest periods for the first 24 hours.  Walk around or put a warm pack on your abdomen to help reduce abdominal cramping and bloating.  Drink enough fluids to keep your urine clear or pale yellow.  You may resume your normal diet as instructed by your health care provider. Avoid heavy or fried foods that are hard to digest.  Avoid drinking alcohol for 24 hours or as instructed by your health care provider.  Only take over-the-counter or prescription medicines as directed by your health care provider.  If a tissue sample (biopsy) was taken during your procedure:  Do not take aspirin or blood thinners for 7 days, or as instructed by your health care provider.  Do not drink alcohol for 7 days, or as instructed by your health care provider.  Eat soft foods for the first 24 hours. SEEK MEDICAL CARE IF: You have persistent spotting of blood in your stool 2-3 days after the procedure. SEEK IMMEDIATE MEDICAL CARE IF:  You have more than a  small spotting of blood in your stool.  You pass large blood clots in your stool.  Your abdomen is swollen (distended).  You have nausea or vomiting.  You have a fever.  You have increasing abdominal pain that is not relieved with medicine.   This information is not intended to replace advice given to you by your health care provider. Make sure you discuss any questions you have with your health care provider.   Document Released: 02/19/2004 Document Revised: 04/27/2013 Document Reviewed: 03/14/2013 Elsevier Interactive Patient Education 2016 Elsevier Inc. High-Fiber Diet Fiber, also called dietary fiber, is a type of carbohydrate found in fruits, vegetables, whole grains, and beans. A high-fiber diet can have many health benefits. Your health care provider may recommend a high-fiber diet to help:  Prevent constipation. Fiber can make your bowel movements more regular.  Lower your cholesterol.  Relieve hemorrhoids, uncomplicated diverticulosis, or irritable bowel syndrome.  Prevent overeating as part of a weight-loss plan.  Prevent heart disease, type 2 diabetes, and certain cancers. WHAT IS MY PLAN? The recommended daily intake of fiber includes:  38 grams for men under age 63.  63 grams for men over age 106.  56 grams for women under age 38.  29 grams for women over age 20. You can get the recommended daily intake of dietary fiber by eating a variety of fruits, vegetables, grains, and beans. Your health care provider may also recommend a fiber supplement if it is not possible to get enough fiber through your diet. WHAT DO I NEED  TO KNOW ABOUT A HIGH-FIBER DIET?  Fiber supplements have not been widely studied for their effectiveness, so it is better to get fiber through food sources.  Always check the fiber content on thenutrition facts label of any prepackaged food. Look for foods that contain at least 5 grams of fiber per serving.  Ask your dietitian if you have  questions about specific foods that are related to your condition, especially if those foods are not listed in the following section.  Increase your daily fiber consumption gradually. Increasing your intake of dietary fiber too quickly may cause bloating, cramping, or gas.  Drink plenty of water. Water helps you to digest fiber. WHAT FOODS CAN I EAT? Grains Whole-grain breads. Multigrain cereal. Oats and oatmeal. Brown rice. Barley. Bulgur wheat. [employer]. Bran muffins. Popcorn. Rye wafer crackers. Vegetables Sweet potatoes. Spinach. Kale. Artichokes. Cabbage. Broccoli. Green peas. Carrots. Squash. Fruits Berries. Pears. Apples. Oranges. Avocados. Prunes and raisins. Dried figs. Meats and Other Protein Sources [place], kidney, pinto, and soy beans. Split peas. Lentils. Nuts and seeds. Dairy Fiber-fortified yogurt. Beverages Fiber-fortified soy milk. Fiber-fortified orange juice. Other Fiber bars. The items listed above may not be a complete list of recommended foods or beverages. Contact your dietitian for more options. WHAT FOODS ARE NOT RECOMMENDED? Grains White bread. Pasta made with refined flour. White rice. Vegetables Fried potatoes. Canned vegetables. Well-cooked vegetables.  Fruits Fruit juice. Cooked, strained fruit. Meats and Other Protein Sources Fatty cuts of meat. Fried Sales executive or fried fish. Dairy Milk. Yogurt. Cream cheese. Sour cream. Beverages Soft drinks. Other Cakes and pastries. Butter and oils. The items listed above may not be a complete list of foods and beverages to avoid. Contact your dietitian for more information. WHAT ARE SOME TIPS FOR INCLUDING HIGH-FIBER FOODS IN MY DIET?  Eat a wide variety of high-fiber foods.  Make sure that half of all grains consumed each day are whole grains.  Replace breads and cereals made from refined flour or white flour with whole-grain breads and cereals.  Replace white rice with brown rice, bulgur wheat, or  millet.  Start the day with a breakfast that is high in fiber, such as a cereal that contains at least 5 grams of fiber per serving.  Use beans in place of meat in soups, salads, or pasta.  Eat high-fiber snacks, such as berries, raw vegetables, nuts, or popcorn.   This information is not intended to replace advice given to you by your health care provider. Make sure you discuss any questions you have with your health care provider.   Document Released: 07/07/2005 Document Revised: 07/28/2014 Document Reviewed: 12/20/2013 Elsevier Interactive Patient Education [employer].

## 2016-01-24 NOTE — Op Note (Signed)
[facility] Patient Name: Jose Clark Procedure Date: 01/24/2016 9:18 AM MRN: [id number] Date of Birth: 1950/10/28 Attending MD: Hildred Laser , MD CSN: [id number] Age: 65 Admit Type: Inpatient Procedure:                Colonoscopy Indications:              Screening for colorectal malignant neoplasm Providers:                Hildred Laser, MD, Lurline Del, RN, Isabella Stalling,                            Technician Referring MD:             [facility] Medicines:                Meperidine 50 mg IV, Midazolam 5 mg IV Complications:            No immediate complications. Estimated Blood Loss:     Estimated blood loss was minimal. Procedure:                Pre-Anesthesia Assessment:                           - Prior to the procedure, a History and Physical                            was performed, and patient medications and                            allergies were reviewed. The patient's tolerance of                            previous anesthesia was also reviewed. The risks                            and benefits of the procedure and the sedation                            options and risks were discussed with the patient.                            All questions were answered, and informed consent                            was obtained. Prior Anticoagulants: The patient has                            taken no previous anticoagulant or antiplatelet                            agents. ASA Grade Assessment: II - A patient with                            mild systemic disease. After reviewing the risks  and benefits, the patient was deemed in                            satisfactory condition to undergo the procedure.                           After obtaining informed consent, the colonoscope                            was passed under direct vision. Throughout the                            procedure, the patient's blood pressure, pulse, and                             oxygen saturations were monitored continuously. The                            [id number]) scope was introduced through                            the anus and advanced to the the cecum, identified                            by appendiceal orifice and ileocecal valve. The                            colonoscopy was performed without difficulty. The                            patient tolerated the procedure well. colowrap was                            used for the procedure The quality of the bowel                            preparation was adequate to identify polyps 6 mm                            and larger in size. The ileocecal valve,                            appendiceal orifice, and rectum were photographed. Scope In: 9:37:11 AM Scope Out: 9:54:55 AM Scope Withdrawal Time: 0 hours 12 minutes 24 seconds  Total Procedure Duration: 0 hours 17 minutes 44 seconds  Findings:      A 4 mm polyp was found in the splenic flexure. The polyp was sessile.       Biopsies were taken with a cold forceps for histology.      A few medium-mouthed diverticula were found in the sigmoid colon and       hepatic flexure.      External hemorrhoids were found during retroflexion. The hemorrhoids       were small. Impression:               -  One 4 mm polyp at the splenic flexure. Biopsied.                           - Diverticulosis in the sigmoid colon and at the                            hepatic flexure.                           - External hemorrhoids. Moderate Sedation:      Moderate (conscious) sedation was administered by the endoscopy nurse       and supervised by the endoscopist. The following parameters were       monitored: oxygen saturation, heart rate, blood pressure, CO2       capnography and response to care. Total physician intraservice time was       21 minutes. Recommendation:           - Patient has a contact number available for                             emergencies. The signs and symptoms of potential                            delayed complications were discussed with the                            patient. Return to normal activities tomorrow.                            Written discharge instructions were provided to the                            patient.                           - High fiber diet today.                           - Continue present medications.                           - Await pathology results.                           - Repeat colonoscopy for surveillance based on                            pathology results. Procedure Code(s):        --- Professional ---                           [phone], Colonoscopy, flexible; with biopsy, single                            or multiple  [id number], Moderate sedation services provided by the                            same physician or other qualified health care                            professional performing the diagnostic or                            therapeutic service that the sedation supports,                            requiring the presence of an independent trained                            observer to assist in the monitoring of the                            patient's level of consciousness and physiological                            status; initial 15 minutes of intraservice time,                            patient age 20 years or older Diagnosis Code(s):        --- Professional ---                           Z12.11, Encounter for screening for malignant                            neoplasm of colon                           D12.3, Benign neoplasm of transverse colon (hepatic                            flexure or splenic flexure)                           K64.4, Residual hemorrhoidal skin tags                           K57.30, Diverticulosis of large intestine without                            perforation or abscess without bleeding CPT  copyright 2016 American Medical Association. All rights reserved. The codes documented in this report are preliminary and upon coder review may  be revised to meet current compliance requirements. Hildred Laser, MD Hildred Laser, MD 01/24/2016 10:03:34 AM This report has been signed electronically. Number of Addenda: 0

## 2016-01-24 NOTE — H&P (Signed)
Jose Clark is an 65 y.o. male.   Chief Complaint: Patient is here for colonoscopy. HPI: Patient is 65 year old African-American male was here for screening colonoscopy. He denies abdominal pain change in bowel habits or rectal bleeding. Family History is negative for CRC.  Past Medical History  Diagnosis Date  . Diabetes mellitus   . Hepatitis C   . Diabetes ([place])     x 20 yrs    Past Surgical History  Procedure Laterality Date  . Circumcision      30 years ago    No family history on file. Social History:  reports that he has been smoking.  He does not have any smokeless tobacco history on file. He reports that he drinks alcohol. He reports that he uses illicit drugs.  Allergies: No Known Allergies  Medications Prior to Admission  Medication Sig Dispense Refill  . insulin NPH-insulin regular (NOVOLIN 70/30) (70-30) 100 UNIT/ML injection Inject 20-25 Units into the skin 2 (two) times daily with a meal.    . polyethylene glycol-electrolytes (NULYTELY/GOLYTELY) 420 g solution Take 4,000 mLs by mouth once. 4000 mL 0    Results for orders placed or performed during the hospital encounter of 01/24/16 (from the past 48 hour(s))  Glucose, capillary     Status: Abnormal   Collection Time: 01/24/16  9:08 AM  Result Value Ref Range   Glucose-Capillary 150 (H) 65 - 99 mg/dL   No results found.  ROS  Blood pressure 126/83, pulse 91, temperature 97.8 F (36.6 C), temperature source Oral, resp. rate 22, height 5\' 11"  (1.803 m), weight 180 lb (81.647 kg), SpO2 100 %. Physical Exam  Constitutional: He appears well-developed and well-nourished.  HENT:  Mouth/Throat: Oropharynx is clear and moist.  Eyes: Conjunctivae are normal. No scleral icterus.  Neck: No thyromegaly present.  Cardiovascular: Normal rate, regular rhythm and normal heart sounds.   No murmur heard. Respiratory: Effort normal and breath sounds normal.  GI: He exhibits no distension and no mass. There is no  tenderness.  Musculoskeletal: He exhibits no edema.  Lymphadenopathy:    He has no cervical adenopathy.  Neurological: He is alert.  Skin: Skin is warm and dry.     Assessment/Plan Average risk screening colonoscopy.  Hildred Laser, MD 01/24/2016, 9:28 AM

## 2016-01-29 ENCOUNTER — Encounter (HOSPITAL_COMMUNITY): Payer: Self-pay | Admitting: Internal Medicine

## 2016-05-14 ENCOUNTER — Ambulatory Visit (INDEPENDENT_AMBULATORY_CARE_PROVIDER_SITE_OTHER): Payer: 59 | Admitting: Internal Medicine

## 2016-05-15 ENCOUNTER — Ambulatory Visit (INDEPENDENT_AMBULATORY_CARE_PROVIDER_SITE_OTHER): Payer: 59 | Admitting: Internal Medicine

## 2016-06-04 ENCOUNTER — Ambulatory Visit (INDEPENDENT_AMBULATORY_CARE_PROVIDER_SITE_OTHER): Payer: 59 | Admitting: Internal Medicine

## 2016-06-23 ENCOUNTER — Encounter (INDEPENDENT_AMBULATORY_CARE_PROVIDER_SITE_OTHER): Payer: Self-pay

## 2016-06-23 ENCOUNTER — Ambulatory Visit (INDEPENDENT_AMBULATORY_CARE_PROVIDER_SITE_OTHER): Payer: 59 | Admitting: Internal Medicine

## 2016-06-23 ENCOUNTER — Encounter (INDEPENDENT_AMBULATORY_CARE_PROVIDER_SITE_OTHER): Payer: Self-pay | Admitting: Internal Medicine

## 2017-08-11 DIAGNOSIS — E119 Type 2 diabetes mellitus without complications: Secondary | ICD-10-CM | POA: Diagnosis not present

## 2017-08-24 DIAGNOSIS — E119 Type 2 diabetes mellitus without complications: Secondary | ICD-10-CM | POA: Diagnosis not present

## 2017-08-26 DIAGNOSIS — Z0001 Encounter for general adult medical examination with abnormal findings: Secondary | ICD-10-CM | POA: Diagnosis not present

## 2017-08-26 DIAGNOSIS — E119 Type 2 diabetes mellitus without complications: Secondary | ICD-10-CM | POA: Diagnosis not present

## 2017-09-23 DIAGNOSIS — E119 Type 2 diabetes mellitus without complications: Secondary | ICD-10-CM | POA: Diagnosis not present

## 2017-09-23 DIAGNOSIS — Z6824 Body mass index (BMI) 24.0-24.9, adult: Secondary | ICD-10-CM | POA: Diagnosis not present

## 2017-09-23 DIAGNOSIS — R809 Proteinuria, unspecified: Secondary | ICD-10-CM | POA: Diagnosis not present

## 2017-09-23 DIAGNOSIS — R945 Abnormal results of liver function studies: Secondary | ICD-10-CM | POA: Diagnosis not present

## 2018-08-19 ENCOUNTER — Emergency Department (HOSPITAL_COMMUNITY): Payer: Medicare Other

## 2018-08-19 ENCOUNTER — Other Ambulatory Visit: Payer: Self-pay

## 2018-08-19 ENCOUNTER — Emergency Department (HOSPITAL_COMMUNITY)
Admission: EM | Admit: 2018-08-19 | Discharge: 2018-08-19 | Disposition: A | Discharge status: Home / Self Care | Payer: Medicare Other | Attending: Emergency Medicine | Admitting: Emergency Medicine

## 2018-08-19 ENCOUNTER — Encounter (HOSPITAL_COMMUNITY): Payer: Self-pay | Admitting: Emergency Medicine

## 2018-08-19 DIAGNOSIS — Z794 Long term (current) use of insulin: Secondary | ICD-10-CM | POA: Diagnosis not present

## 2018-08-19 DIAGNOSIS — R739 Hyperglycemia, unspecified: Secondary | ICD-10-CM

## 2018-08-19 DIAGNOSIS — E1165 Type 2 diabetes mellitus with hyperglycemia: Secondary | ICD-10-CM | POA: Insufficient documentation

## 2018-08-19 DIAGNOSIS — R0789 Other chest pain: Secondary | ICD-10-CM | POA: Diagnosis not present

## 2018-08-19 DIAGNOSIS — R059 Cough, unspecified: Secondary | ICD-10-CM

## 2018-08-19 DIAGNOSIS — F17210 Nicotine dependence, cigarettes, uncomplicated: Secondary | ICD-10-CM | POA: Diagnosis not present

## 2018-08-19 DIAGNOSIS — R05 Cough: Secondary | ICD-10-CM | POA: Diagnosis not present

## 2018-08-19 DIAGNOSIS — R0602 Shortness of breath: Secondary | ICD-10-CM | POA: Diagnosis not present

## 2018-08-19 DIAGNOSIS — R079 Chest pain, unspecified: Secondary | ICD-10-CM | POA: Diagnosis present

## 2018-08-19 LAB — CBG MONITORING, ED: GLUCOSE-CAPILLARY: 262 mg/dL — AB (ref 70–99)

## 2018-08-19 MED ORDER — BENZONATATE 200 MG PO CAPS: 200.0000 mg | ORAL_CAPSULE | Freq: Three times a day (TID) | ORAL | 0 refills | Status: DC | PRN

## 2018-08-19 MED ORDER — AZITHROMYCIN 250 MG PO TABS: ORAL_TABLET | ORAL | 0 refills | Status: DC

## 2018-08-19 NOTE — ED Provider Notes (Signed)
[facility] EMERGENCY DEPARTMENT Provider Note   CSN: [id number] Arrival date & time: 08/19/18  1224     History   Chief Complaint Chief Complaint  Patient presents with  . chest congestion    HPI Jose Clark is a 68 y.o. male.  HPI  Jose Clark is a 68 y.o. male with hx of DM and hep C, who presents to the Emergency Department complaining of chest and nasal congestion, cough, and chills.  Symptoms present for 3 days.  He describes his cough as productive of white, thick sputum and associated with chest tightness when cough is excessive.  He denies fever, chest pain, peripheral edema and shortness of breath.  He has tried OTC cough medication without relief.  No known sick contacts.    Past Medical History:  Diagnosis Date  . Diabetes ([place])    x 20 yrs  . Diabetes mellitus   . Hepatitis C     Patient Active Problem List   Diagnosis Date Noted  . Diabetes ([place]) 11/10/2014  . Hepatitis C 11/10/2014    Past Surgical History:  Procedure Laterality Date  . CIRCUMCISION     30 years ago  . COLONOSCOPY N/A 01/24/2016   Procedure: COLONOSCOPY;  Surgeon: Rogene Houston, MD;  Location: [facility] SUITE;  Service: Endoscopy;  Laterality: N/A;  1130      Home Medications    Prior to Admission medications   Medication Sig Start Date End Date Taking? Authorizing Provider  insulin NPH-insulin regular (NOVOLIN 70/30) (70-30) 100 UNIT/ML injection Inject 20-25 Units into the skin 2 (two) times daily with a meal.    [provider]    Family History No family history on file.  Social History Social History   Tobacco Use  . Smoking status: Current Some Day Smoker  . Smokeless tobacco: Never Used  . Tobacco comment: 1 pack every couple of weeks x 10 year.  Substance Use Topics  . Alcohol use: Yes    Alcohol/week: 0.0 standard drinks    Comment: occassionally  does not drink everyday. Occasional beer.  . Drug use: Not Currently    Comment: cocaine      Allergies   Patient has no known allergies.   Review of Systems Review of Systems  Constitutional: Positive for chills. Negative for appetite change and fever.  HENT: Positive for congestion. Negative for sore throat and trouble swallowing.   Respiratory: Positive for cough and chest tightness. Negative for shortness of breath and wheezing.   Cardiovascular: Negative for chest pain and leg swelling.  Gastrointestinal: Negative for abdominal pain, nausea and vomiting.  Genitourinary: Negative for dysuria.  Musculoskeletal: Negative for arthralgias and myalgias.  Skin: Negative for rash.  Neurological: Negative for dizziness, weakness and numbness.  Hematological: Negative for adenopathy.     Physical Exam Updated Vital Signs BP 114/77 (BP Location: Right Arm)   Pulse 90   Temp 97.7 F (36.5 C) (Oral)   Resp 12   Ht 5\' 9"  (1.753 m)   Wt 81.6 kg   SpO2 98%   BMI 26.58 kg/m   Physical Exam Vitals signs and nursing note reviewed.  Constitutional:      General: He is not in acute distress.    Appearance: Normal appearance. He is well-developed.  HENT:     Head: Normocephalic.     Right Ear: Tympanic membrane and ear canal normal.     Left Ear: Tympanic membrane and ear canal normal.  Mouth/Throat:     Mouth: Mucous membranes are moist.     Pharynx: Oropharynx is clear. Uvula midline. No oropharyngeal exudate or posterior oropharyngeal erythema.  Eyes:     Pupils: Pupils are equal, round, and reactive to light.  Neck:     Musculoskeletal: Full passive range of motion without pain, normal range of motion and neck supple.     Trachea: Phonation normal.  Cardiovascular:     Rate and Rhythm: Normal rate and regular rhythm.     Heart sounds: Normal heart sounds. No murmur.  Pulmonary:     Effort: Pulmonary effort is normal. No respiratory distress.     Breath sounds: No stridor. No wheezing or rales.  Chest:     Chest wall: No tenderness.  Abdominal:      General: There is no distension.     Palpations: Abdomen is soft.     Tenderness: There is no abdominal tenderness.  Musculoskeletal:     Right lower leg: No edema.     Left lower leg: No edema.  Lymphadenopathy:     Cervical: No cervical adenopathy.  Skin:    General: Skin is warm.     Capillary Refill: Capillary refill takes less than 2 seconds.     Findings: No rash.  Neurological:     General: No focal deficit present.     Mental Status: He is alert.     Sensory: No sensory deficit.     Motor: No weakness or abnormal muscle tone.     Coordination: Coordination normal.  Psychiatric:        Mood and Affect: Mood normal.      ED Treatments / Results  Labs (all labs ordered are listed, but only abnormal results are displayed) Labs Reviewed  CBG MONITORING, ED - Abnormal; Notable for the following components:      Result Value   Glucose-Capillary 262 (*)    All other components within normal limits    EKG None  Radiology Dg Chest 2 View  Result Date: 08/19/2018 CLINICAL DATA:  Shortness of breath and cough for 4 days. EXAM: CHEST - 2 VIEW COMPARISON:  October 20, 2011 FINDINGS: The heart size and mediastinal contours are within normal limits. The aorta is tortuous. Both lungs are clear. The visualized skeletal structures are unremarkable. IMPRESSION: No active cardiopulmonary disease. Electronically Signed   By: Abelardo Diesel M.D.   On: 08/19/2018 14:14    Procedures Procedures (including critical care time)  Medications Ordered in ED Medications - No data to display   Initial Impression / Assessment and Plan / ED Course  I have reviewed the triage vital signs and the nursing notes.  Pertinent labs & imaging results that were available during my care of the patient were reviewed by me and considered in my medical decision making (see chart for details).     Pt is well appearing.  Vitals reassuring.  Sx's felt to be respiratory.  Doubt ACS, CHF.  CXR reassuring.   Pt is hyperglycemic, but admits that he did not take his afternoon insulin dose.  No concerning sx's for DKA.  Appears appropriate for d/c home and agrees to take his insulin when he gets home.  Return precautions discussed.   Final Clinical Impressions(s) / ED Diagnoses   Final diagnoses:  Cough  Hyperglycemia    ED Discharge Orders    None       Bufford Lope 08/20/18 2030    Davonna Belling, MD 08/20/18 2311

## 2018-08-19 NOTE — ED Notes (Signed)
ekg printed and handed to Dr. Alvino Chapel

## 2018-08-19 NOTE — ED Triage Notes (Signed)
On set 3 days, cough and cold, chest congestion, chills, white sputum.

## 2018-08-19 NOTE — Discharge Instructions (Addendum)
Take the medication as directed until its finished.  Drink plenty of water.  Be sure to take your insulin when you get home and check your sugars closely.  Follow-up with your primary doctor or return here for any worsening symptoms.

## 2018-11-18 DIAGNOSIS — Z0000 Encounter for general adult medical examination without abnormal findings: Secondary | ICD-10-CM | POA: Diagnosis not present

## 2019-09-06 DIAGNOSIS — E1165 Type 2 diabetes mellitus with hyperglycemia: Secondary | ICD-10-CM | POA: Diagnosis not present

## 2019-09-06 DIAGNOSIS — F5221 Male erectile disorder: Secondary | ICD-10-CM | POA: Diagnosis not present

## 2019-09-06 DIAGNOSIS — B351 Tinea unguium: Secondary | ICD-10-CM | POA: Diagnosis not present

## 2019-09-06 DIAGNOSIS — I10 Essential (primary) hypertension: Secondary | ICD-10-CM | POA: Diagnosis not present

## 2019-09-06 DIAGNOSIS — E119 Type 2 diabetes mellitus without complications: Secondary | ICD-10-CM | POA: Diagnosis not present

## 2019-09-06 DIAGNOSIS — E785 Hyperlipidemia, unspecified: Secondary | ICD-10-CM | POA: Diagnosis not present

## 2019-09-22 ENCOUNTER — Ambulatory Visit: Payer: Medicare Other

## 2019-09-22 ENCOUNTER — Ambulatory Visit: Payer: Medicaid Other | Attending: Internal Medicine

## 2019-09-22 DIAGNOSIS — Z23 Encounter for immunization: Secondary | ICD-10-CM

## 2019-09-22 NOTE — Progress Notes (Signed)
   Covid-19 Vaccination Clinic  Name:  IVORY THORMAN    MRN: [id number] DOB: 1951/04/01  09/22/2019  Mr. Hussong was observed post Covid-19 immunization for 15 minutes without incident. He was provided with Vaccine Information Sheet and instruction to access the V-Safe system.   Mr. Hiatt was instructed to call 911 with any severe reactions post vaccine: [name] Kitchen Difficulty breathing  . Swelling of face and throat  . A fast heartbeat  . A bad rash all over body  . Dizziness and weakness   Immunizations Administered    Name Date Dose VIS Date Route   Moderna COVID-19 Vaccine 09/22/2019  9:46 AM 0.5 mL 06/21/2019 Intramuscular   Manufacturer: Moderna   Lot: [id number]   [place]:[id number]

## 2019-10-26 ENCOUNTER — Ambulatory Visit: Payer: Medicare HMO | Attending: Internal Medicine

## 2019-10-27 ENCOUNTER — Ambulatory Visit: Payer: Medicare HMO | Attending: Internal Medicine

## 2019-10-27 DIAGNOSIS — Z23 Encounter for immunization: Secondary | ICD-10-CM

## 2019-10-27 NOTE — Progress Notes (Signed)
   Covid-19 Vaccination Clinic  Name:  Jose Clark    MRN: [id number] DOB: April 05, 1951  10/27/2019  Jose Clark was observed post Covid-19 immunization for 15 minutes without incident. He was provided with Vaccine Information Sheet and instruction to access the V-Safe system.   Jose Clark was instructed to call 911 with any severe reactions post vaccine: [name] Kitchen Difficulty breathing  . Swelling of face and throat  . A fast heartbeat  . A bad rash all over body  . Dizziness and weakness   Immunizations Administered    Name Date Dose VIS Date Route   Moderna COVID-19 Vaccine 10/27/2019  8:15 AM 0.5 mL 06/21/2019 Intramuscular   Manufacturer: Moderna   Lot: [id number]   [place]:[id number]

## 2019-11-25 DIAGNOSIS — R809 Proteinuria, unspecified: Secondary | ICD-10-CM | POA: Diagnosis not present

## 2019-11-25 DIAGNOSIS — Z0001 Encounter for general adult medical examination with abnormal findings: Secondary | ICD-10-CM | POA: Diagnosis not present

## 2019-11-25 DIAGNOSIS — R945 Abnormal results of liver function studies: Secondary | ICD-10-CM | POA: Diagnosis not present

## 2019-11-25 DIAGNOSIS — Z6824 Body mass index (BMI) 24.0-24.9, adult: Secondary | ICD-10-CM | POA: Diagnosis not present

## 2019-11-25 DIAGNOSIS — Z6825 Body mass index (BMI) 25.0-25.9, adult: Secondary | ICD-10-CM | POA: Diagnosis not present

## 2019-11-25 DIAGNOSIS — F17200 Nicotine dependence, unspecified, uncomplicated: Secondary | ICD-10-CM | POA: Diagnosis not present

## 2019-11-25 DIAGNOSIS — Z0000 Encounter for general adult medical examination without abnormal findings: Secondary | ICD-10-CM | POA: Diagnosis not present

## 2019-11-25 DIAGNOSIS — E119 Type 2 diabetes mellitus without complications: Secondary | ICD-10-CM | POA: Diagnosis not present

## 2019-11-25 DIAGNOSIS — F17210 Nicotine dependence, cigarettes, uncomplicated: Secondary | ICD-10-CM | POA: Diagnosis not present

## 2020-01-29 IMAGING — DX DG CHEST 2V
2 series · 2 of 2 positions shown · non-contrast
Comparison: October 20, 2011

CLINICAL DATA: Shortness of breath and cough for 4 days.

EXAM:
CHEST - 2 VIEW

[chest pa]
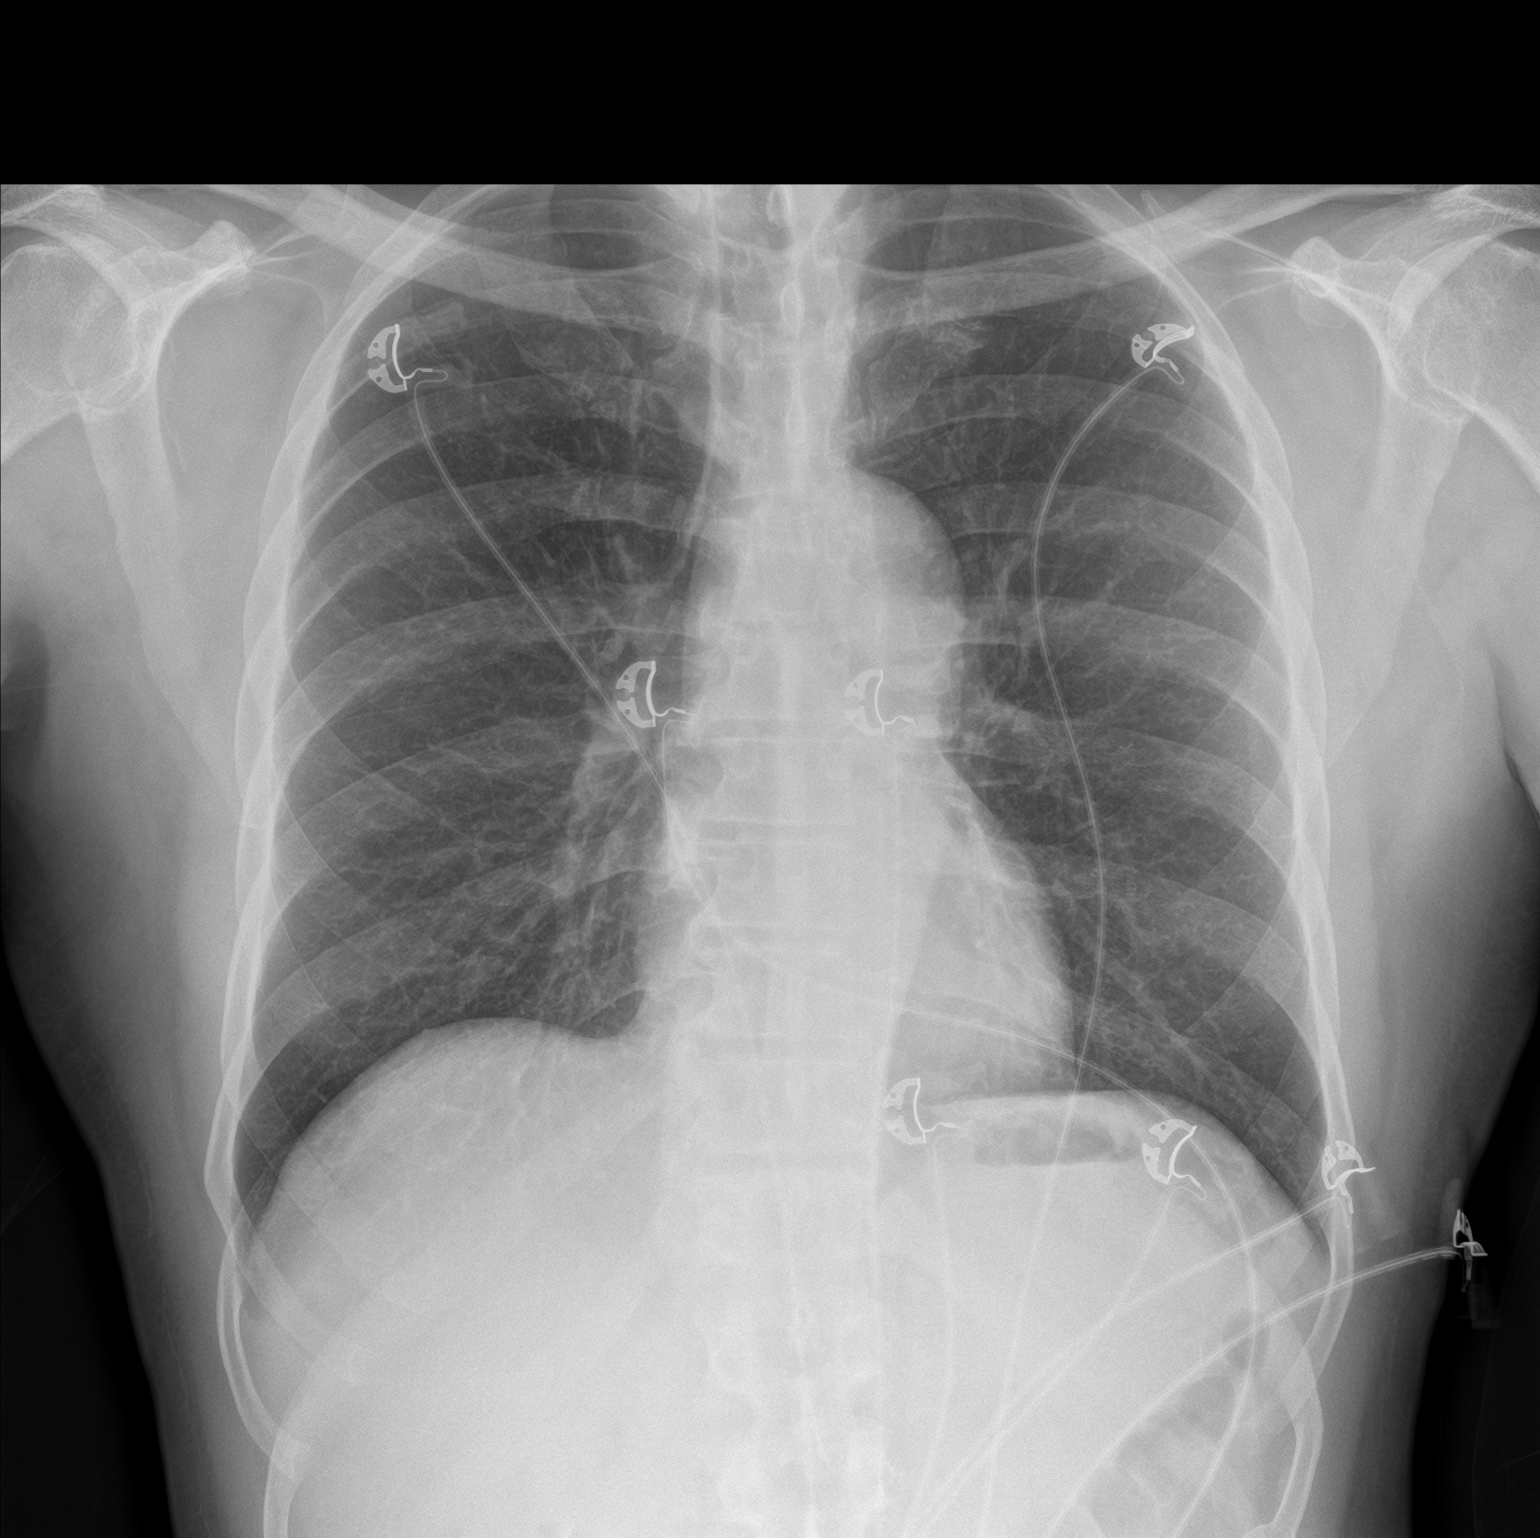

[chest lat]
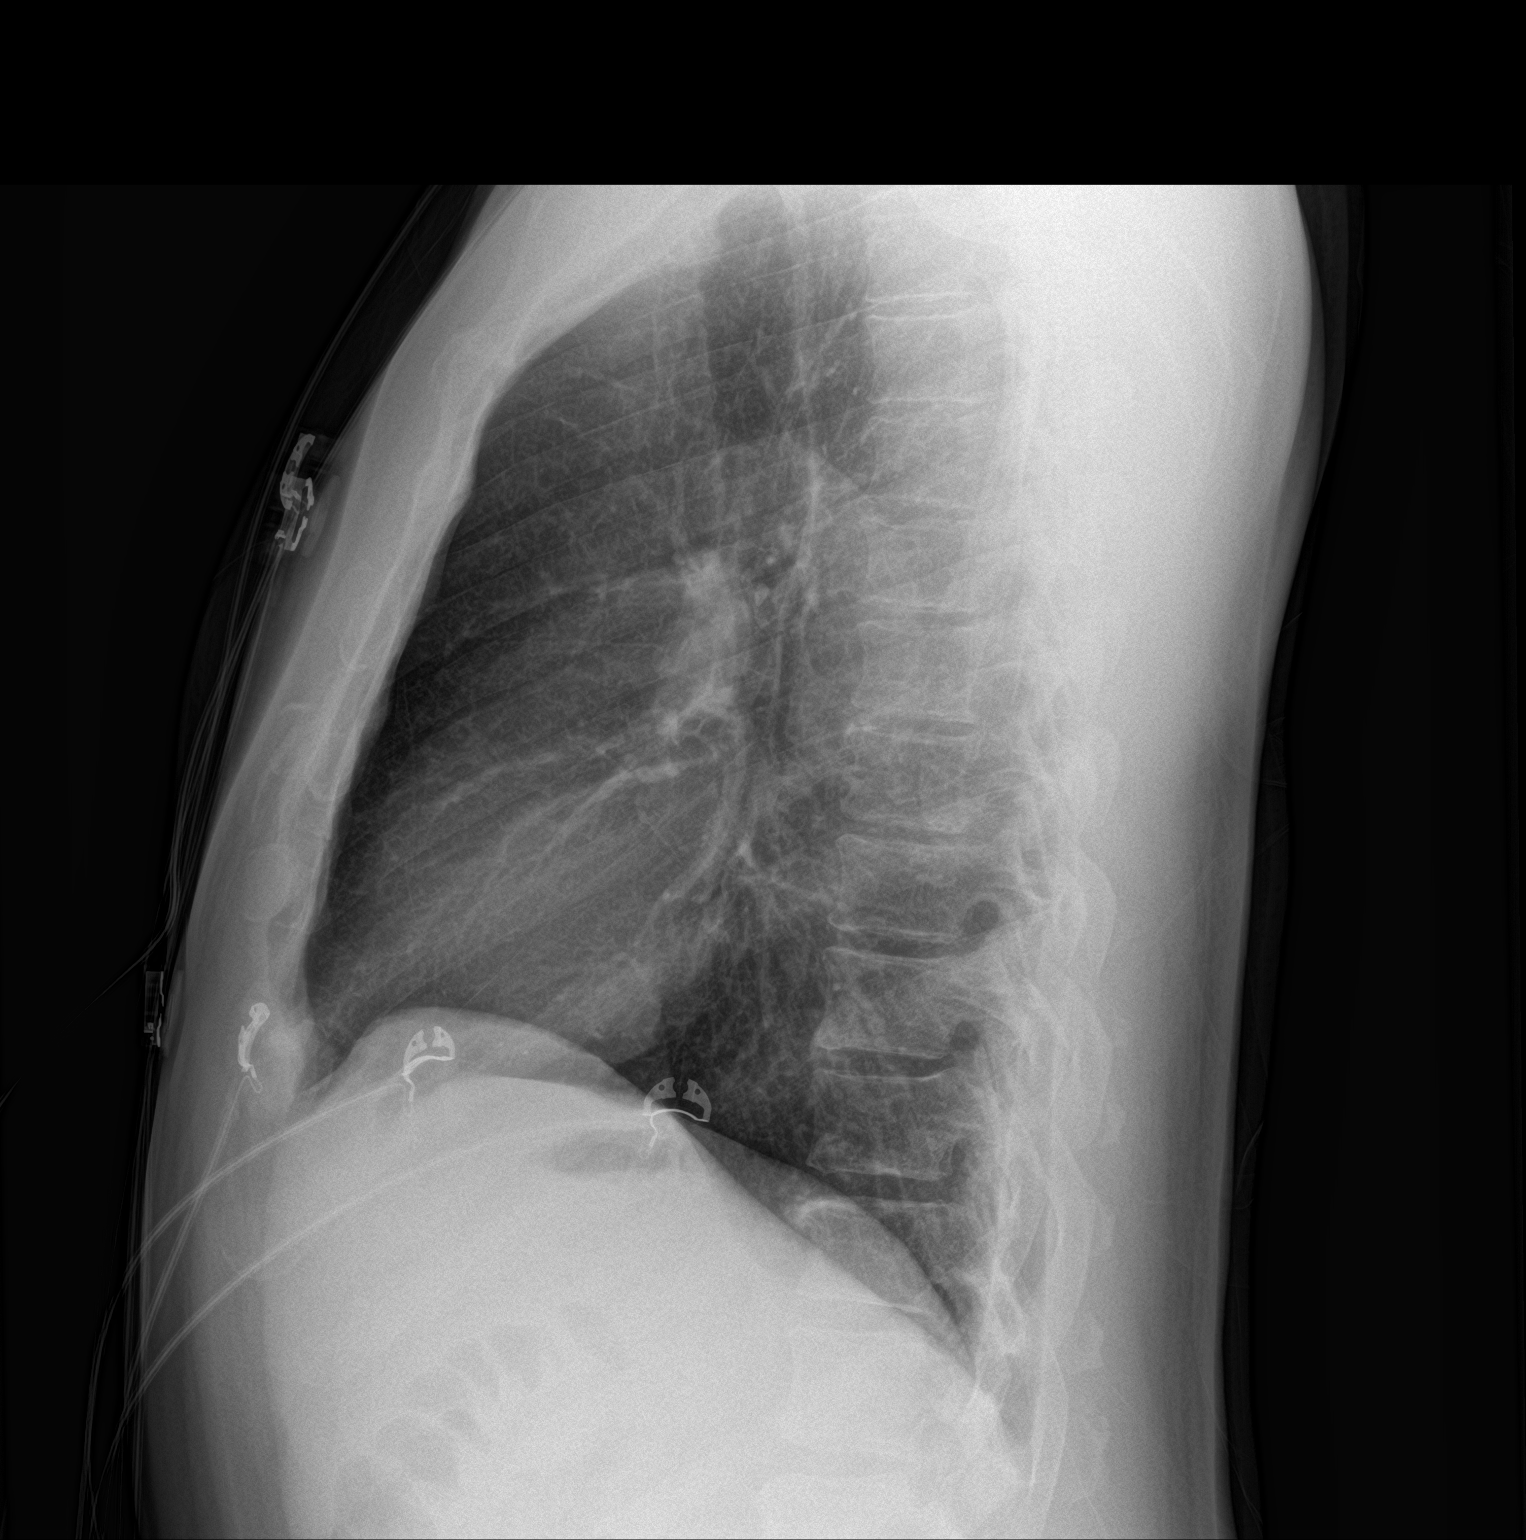

[2 of 2 positions shown; findings below may reference images not displayed]

FINDINGS: The heart size and mediastinal contours are within normal limits.
The aorta is tortuous. Both lungs are clear. The visualized skeletal
structures are unremarkable.
IMPRESSION: No active cardiopulmonary disease.

## 2020-03-05 DIAGNOSIS — F17200 Nicotine dependence, unspecified, uncomplicated: Secondary | ICD-10-CM | POA: Diagnosis not present

## 2020-03-05 DIAGNOSIS — R809 Proteinuria, unspecified: Secondary | ICD-10-CM | POA: Diagnosis not present

## 2020-03-05 DIAGNOSIS — Z6824 Body mass index (BMI) 24.0-24.9, adult: Secondary | ICD-10-CM | POA: Diagnosis not present

## 2020-03-05 DIAGNOSIS — Z6825 Body mass index (BMI) 25.0-25.9, adult: Secondary | ICD-10-CM | POA: Diagnosis not present

## 2020-03-05 DIAGNOSIS — Z0000 Encounter for general adult medical examination without abnormal findings: Secondary | ICD-10-CM | POA: Diagnosis not present

## 2020-03-05 DIAGNOSIS — R945 Abnormal results of liver function studies: Secondary | ICD-10-CM | POA: Diagnosis not present

## 2020-03-05 DIAGNOSIS — F17210 Nicotine dependence, cigarettes, uncomplicated: Secondary | ICD-10-CM | POA: Diagnosis not present

## 2020-03-05 DIAGNOSIS — E119 Type 2 diabetes mellitus without complications: Secondary | ICD-10-CM | POA: Diagnosis not present

## 2020-03-05 DIAGNOSIS — Z0001 Encounter for general adult medical examination with abnormal findings: Secondary | ICD-10-CM | POA: Diagnosis not present

## 2020-03-07 DIAGNOSIS — H25013 Cortical age-related cataract, bilateral: Secondary | ICD-10-CM | POA: Diagnosis not present

## 2020-03-07 DIAGNOSIS — F5221 Male erectile disorder: Secondary | ICD-10-CM | POA: Diagnosis not present

## 2020-03-07 DIAGNOSIS — E1165 Type 2 diabetes mellitus with hyperglycemia: Secondary | ICD-10-CM | POA: Diagnosis not present

## 2020-03-07 DIAGNOSIS — E785 Hyperlipidemia, unspecified: Secondary | ICD-10-CM | POA: Diagnosis not present

## 2020-03-07 DIAGNOSIS — Z0001 Encounter for general adult medical examination with abnormal findings: Secondary | ICD-10-CM | POA: Diagnosis not present

## 2020-06-18 DIAGNOSIS — R945 Abnormal results of liver function studies: Secondary | ICD-10-CM | POA: Diagnosis not present

## 2020-06-18 DIAGNOSIS — E113393 Type 2 diabetes mellitus with moderate nonproliferative diabetic retinopathy without macular edema, bilateral: Secondary | ICD-10-CM | POA: Diagnosis not present

## 2020-06-18 DIAGNOSIS — H25013 Cortical age-related cataract, bilateral: Secondary | ICD-10-CM | POA: Diagnosis not present

## 2020-06-18 DIAGNOSIS — Z125 Encounter for screening for malignant neoplasm of prostate: Secondary | ICD-10-CM | POA: Diagnosis not present

## 2020-06-18 DIAGNOSIS — Z0001 Encounter for general adult medical examination with abnormal findings: Secondary | ICD-10-CM | POA: Diagnosis not present

## 2020-06-18 DIAGNOSIS — Z6824 Body mass index (BMI) 24.0-24.9, adult: Secondary | ICD-10-CM | POA: Diagnosis not present

## 2020-06-18 DIAGNOSIS — R809 Proteinuria, unspecified: Secondary | ICD-10-CM | POA: Diagnosis not present

## 2020-06-18 DIAGNOSIS — F17200 Nicotine dependence, unspecified, uncomplicated: Secondary | ICD-10-CM | POA: Diagnosis not present

## 2020-06-18 DIAGNOSIS — H40023 Open angle with borderline findings, high risk, bilateral: Secondary | ICD-10-CM | POA: Diagnosis not present

## 2020-06-18 DIAGNOSIS — Z0000 Encounter for general adult medical examination without abnormal findings: Secondary | ICD-10-CM | POA: Diagnosis not present

## 2020-06-18 DIAGNOSIS — H2513 Age-related nuclear cataract, bilateral: Secondary | ICD-10-CM | POA: Diagnosis not present

## 2020-06-18 DIAGNOSIS — F17210 Nicotine dependence, cigarettes, uncomplicated: Secondary | ICD-10-CM | POA: Diagnosis not present

## 2020-06-18 DIAGNOSIS — E119 Type 2 diabetes mellitus without complications: Secondary | ICD-10-CM | POA: Diagnosis not present

## 2020-06-18 DIAGNOSIS — Z6825 Body mass index (BMI) 25.0-25.9, adult: Secondary | ICD-10-CM | POA: Diagnosis not present

## 2020-06-25 DIAGNOSIS — H2511 Age-related nuclear cataract, right eye: Secondary | ICD-10-CM | POA: Diagnosis not present

## 2020-06-25 DIAGNOSIS — H25013 Cortical age-related cataract, bilateral: Secondary | ICD-10-CM | POA: Diagnosis not present

## 2020-06-25 DIAGNOSIS — H2513 Age-related nuclear cataract, bilateral: Secondary | ICD-10-CM | POA: Diagnosis not present

## 2020-07-04 DIAGNOSIS — H25811 Combined forms of age-related cataract, right eye: Secondary | ICD-10-CM | POA: Diagnosis not present

## 2020-07-04 DIAGNOSIS — H2511 Age-related nuclear cataract, right eye: Secondary | ICD-10-CM | POA: Diagnosis not present

## 2020-07-10 DIAGNOSIS — H25012 Cortical age-related cataract, left eye: Secondary | ICD-10-CM | POA: Diagnosis not present

## 2020-07-10 DIAGNOSIS — H2512 Age-related nuclear cataract, left eye: Secondary | ICD-10-CM | POA: Diagnosis not present

## 2020-07-18 DIAGNOSIS — H25812 Combined forms of age-related cataract, left eye: Secondary | ICD-10-CM | POA: Diagnosis not present

## 2020-07-18 DIAGNOSIS — H2512 Age-related nuclear cataract, left eye: Secondary | ICD-10-CM | POA: Diagnosis not present

## 2020-07-18 DIAGNOSIS — H25012 Cortical age-related cataract, left eye: Secondary | ICD-10-CM | POA: Diagnosis not present

## 2020-08-21 ENCOUNTER — Encounter (HOSPITAL_COMMUNITY): Payer: Self-pay | Admitting: *Deleted

## 2020-08-21 ENCOUNTER — Emergency Department (HOSPITAL_COMMUNITY): Payer: Medicare HMO

## 2020-08-21 ENCOUNTER — Other Ambulatory Visit: Payer: Self-pay

## 2020-08-21 ENCOUNTER — Emergency Department (HOSPITAL_COMMUNITY)
Admission: EM | Admit: 2020-08-21 | Discharge: 2020-08-22 | Disposition: A | Discharge status: Home / Self Care | Payer: Medicare HMO | Attending: Emergency Medicine | Admitting: Emergency Medicine

## 2020-08-21 DIAGNOSIS — R509 Fever, unspecified: Secondary | ICD-10-CM | POA: Insufficient documentation

## 2020-08-21 DIAGNOSIS — E119 Type 2 diabetes mellitus without complications: Secondary | ICD-10-CM | POA: Insufficient documentation

## 2020-08-21 DIAGNOSIS — M791 Myalgia, unspecified site: Secondary | ICD-10-CM | POA: Insufficient documentation

## 2020-08-21 DIAGNOSIS — Z20822 Contact with and (suspected) exposure to covid-19: Secondary | ICD-10-CM | POA: Diagnosis not present

## 2020-08-21 DIAGNOSIS — F17200 Nicotine dependence, unspecified, uncomplicated: Secondary | ICD-10-CM | POA: Insufficient documentation

## 2020-08-21 LAB — COMPREHENSIVE METABOLIC PANEL
ALT: 24 U/L (ref 0–44)
AST: 22 U/L (ref 15–41)
Albumin: 3.4 g/dL — ABNORMAL LOW (ref 3.5–5.0)
Alkaline Phosphatase: 87 U/L (ref 38–126)
Anion gap: 9 (ref 5–15)
BUN: 21 mg/dL (ref 8–23)
CO2: 22 mmol/L (ref 22–32)
Calcium: 8.8 mg/dL — ABNORMAL LOW (ref 8.9–10.3)
Chloride: 103 mmol/L (ref 98–111)
Creatinine, Ser: 1.52 mg/dL — ABNORMAL HIGH (ref 0.61–1.24)
GFR, Estimated: 50 mL/min — ABNORMAL LOW (ref >60)
Glucose, Bld: 110 mg/dL — ABNORMAL HIGH (ref 70–99)
Potassium: 4.4 mmol/L (ref 3.5–5.1)
Sodium: 134 mmol/L — ABNORMAL LOW (ref 135–145)
Total Bilirubin: 0.8 mg/dL (ref 0.3–1.2)
Total Protein: 7.3 g/dL (ref 6.5–8.1)

## 2020-08-21 LAB — CBC WITH DIFFERENTIAL/PLATELET
Abs Immature Granulocytes: 0.03 10*3/uL (ref 0.00–0.07)
Basophils Absolute: 0.1 10*3/uL (ref 0.0–0.1)
Basophils Relative: 1 %
Eosinophils Absolute: 0 10*3/uL (ref 0.0–0.5)
Eosinophils Relative: 0 %
HCT: 45.8 % (ref 39.0–52.0)
Hemoglobin: 15.9 g/dL (ref 13.0–17.0)
Immature Granulocytes: 0 %
Lymphocytes Relative: 32 %
Lymphs Abs: 3.6 10*3/uL (ref 0.7–4.0)
MCH: 32.4 pg (ref 26.0–34.0)
MCHC: 34.7 g/dL (ref 30.0–36.0)
MCV: 93.5 fL (ref 80.0–100.0)
Monocytes Absolute: 1.8 10*3/uL — ABNORMAL HIGH (ref 0.1–1.0)
Monocytes Relative: 16 %
Neutro Abs: 5.7 10*3/uL (ref 1.7–7.7)
Neutrophils Relative %: 51 %
Platelets: 147 10*3/uL — ABNORMAL LOW (ref 150–400)
RBC: 4.9 MIL/uL (ref 4.22–5.81)
RDW: 11.9 % (ref 11.5–15.5)
WBC: 11.2 10*3/uL — ABNORMAL HIGH (ref 4.0–10.5)
nRBC: 0 % (ref 0.0–0.2)

## 2020-08-21 LAB — POC SARS CORONAVIRUS 2 AG -  ED: SARS Coronavirus 2 Ag: NEGATIVE

## 2020-08-21 LAB — SARS CORONAVIRUS 2 BY RT PCR (HOSPITAL ORDER, PERFORMED IN ~~LOC~~ HOSPITAL LAB): SARS Coronavirus 2: NEGATIVE

## 2020-08-21 MED ORDER — ACETAMINOPHEN 325 MG PO TABS
650.0000 mg | ORAL_TABLET | Freq: Once | ORAL | Status: AC
  Administered 2020-08-21: 650 mg via ORAL
  Filled 2020-08-21: qty 2

## 2020-08-21 NOTE — ED Triage Notes (Signed)
Pt with body aches and chills since yesterday, unknown of fever.

## 2020-08-22 LAB — URINALYSIS, ROUTINE W REFLEX MICROSCOPIC
Bilirubin Urine: NEGATIVE
Glucose, UA: NEGATIVE mg/dL
Hgb urine dipstick: NEGATIVE
Ketones, ur: 20 mg/dL — AB
Leukocytes,Ua: NEGATIVE
Nitrite: NEGATIVE
Protein, ur: >=300 mg/dL — AB
Specific Gravity, Urine: 1.028 (ref 1.005–1.030)
pH: 5 (ref 5.0–8.0)

## 2020-08-22 NOTE — ED Provider Notes (Signed)
[facility] Emergency Department Provider Note MRN:  [id number]  Arrival date & time: 08/22/20     Chief Complaint   Generalized Body Aches   History of Present Illness   Jose Clark is a 70 y.o. year-old male with a history of diabetes presenting to the ED with chief complaint of generalized aches.  Fever, chills, body aches for the past 2 days.  Denies nasal congestion, no cough, no sore throat, no shortness of breath, no chest pain, no abdominal pain, no nausea vomiting or diarrhea.  No rash, no burning with urination.  Feels better over the past few hours without intervention.  Symptoms are mild, constant.  Review of Systems  A complete 10 system review of systems was obtained and all systems are negative except as noted in the HPI and PMH.   Patient's Health History    Past Medical History:  Diagnosis Date  . Diabetes ([place])    x 20 yrs  . Diabetes mellitus   . Hepatitis C     Past Surgical History:  Procedure Laterality Date  . CIRCUMCISION     30 years ago  . COLONOSCOPY N/A 01/24/2016   Procedure: COLONOSCOPY;  Surgeon: Rogene Houston, MD;  Location: [facility];  Service: Endoscopy;  Laterality: N/A;  1130    History reviewed. No pertinent family history.  Social History   Socioeconomic History  . Marital status: Single    Spouse name: Not on file  . Number of children: Not on file  . Years of education: Not on file  . Highest education level: Not on file  Occupational History  . Not on file  Tobacco Use  . Smoking status: Current Some Day Smoker  . Smokeless tobacco: Never Used  . Tobacco comment: 1 pack every couple of weeks x 10 year.  Substance and Sexual Activity  . Alcohol use: Yes    Alcohol/week: 0.0 standard drinks    Comment: occassionally  does not drink everyday. Occasional beer.  . Drug use: Not Currently    Comment: cocaine  . Sexual activity: Not on file  Other Topics Concern  . Not on file  Social History  Narrative  . Not on file   Social Determinants of Health   Financial Resource Strain: Not on file  Food Insecurity: Not on file  Transportation Needs: Not on file  Physical Activity: Not on file  Stress: Not on file  Social Connections: Not on file  Intimate Partner Violence: Not on file     Physical Exam   Vitals:   08/21/20 2253 08/22/20 0007  BP: (!) 154/84 (!) 153/91  Pulse: 77 81  Resp: 18 18  Temp:    SpO2: 97% 98%    CONSTITUTIONAL: Well-appearing, NAD NEURO:  Alert and oriented x 3, no focal deficits EYES:  eyes equal and reactive ENT/NECK:  no LAD, no JVD CARDIO: Regular rate, well-perfused, normal S1 and S2 PULM:  CTAB no wheezing or rhonchi GI/GU:  normal bowel sounds, non-distended, non-tender MSK/SPINE:  No gross deformities, no edema SKIN:  no rash, atraumatic PSYCH:  Appropriate speech and behavior  *Additional and/or pertinent findings included in MDM below  Diagnostic and Interventional Summary    EKG Interpretation  Date/Time:    Ventricular Rate:    PR Interval:    QRS Duration:   QT Interval:    QTC Calculation:   R Axis:     Text Interpretation:        Labs  Reviewed  CBC WITH DIFFERENTIAL/PLATELET - Abnormal; Notable for the following components:      Result Value   WBC 11.2 (*)    Platelets 147 (*)    Monocytes Absolute 1.8 (*)    All other components within normal limits  COMPREHENSIVE METABOLIC PANEL - Abnormal; Notable for the following components:   Sodium 134 (*)    Glucose, Bld 110 (*)    Creatinine, Ser 1.52 (*)    Calcium 8.8 (*)    Albumin 3.4 (*)    GFR, Estimated 50 (*)    All other components within normal limits  URINALYSIS, ROUTINE W REFLEX MICROSCOPIC - Abnormal; Notable for the following components:   Color, Urine AMBER (*)    APPearance HAZY (*)    Ketones, ur 20 (*)    Protein, ur >=300 (*)    Bacteria, UA RARE (*)    All other components within normal limits  SARS CORONAVIRUS 2 BY RT PCR (HOSPITAL  ORDER, [facility])  POC SARS CORONAVIRUS 2 AG -  ED    DG Chest Portable 1 View  Final Result      Medications  acetaminophen (TYLENOL) tablet 650 mg (650 mg Oral Given 08/21/20 1813)     Procedures  /  Critical Care Procedures  ED Course and Medical Decision Making  I have reviewed the triage vital signs, the nursing notes, and pertinent available records from the EMR.  Listed above are laboratory and imaging tests that I personally ordered, reviewed, and interpreted and then considered in my medical decision making (see below for details).  Fever of unknown source, suspect viral.  Well-appearing, normal vital signs, did have fever in triage.  Abdomen benign, no meningismus, chest x-ray is unremarkable.  Rapid Covid test is negative, awaiting PCR.  Will obtain urinalysis to exclude cystitis though he is asymptomatic.  Overall patient seems appropriate for discharge with symptomatic management at home and return precautions.       Barth Kirks. Sedonia Small, [place] [email or web address]  Final Clinical Impressions(s) / ED Diagnoses     ICD-10-CM   1. Fever, unspecified fever cause  R50.9     ED Discharge Orders    None       Discharge Instructions Discussed with and Provided to Patient:     Discharge Instructions     You were evaluated in the Emergency Department and after careful evaluation, we did not find any emergent condition requiring admission or further testing in the hospital.  Your exam/testing today was overall reassuring.  We suspect your symptoms are due to a virus.  You have tested negative for the coronavirus.  You should be feeling better over the next few days.  Please return to the Emergency Department if you experience any worsening of your condition.  Thank you for allowing [place] to be a part of your care.        Maudie Flakes, MD 08/22/20 [phone]

## 2020-08-22 NOTE — Discharge Instructions (Addendum)
You were evaluated in the Emergency Department and after careful evaluation, we did not find any emergent condition requiring admission or further testing in the hospital.  Your exam/testing today was overall reassuring.  We suspect your symptoms are due to a virus.  You have tested negative for the coronavirus.  You should be feeling better over the next few days.  Please return to the Emergency Department if you experience any worsening of your condition.  Thank you for allowing [place] to be a part of your care.

## 2020-08-27 DIAGNOSIS — Z0100 Encounter for examination of eyes and vision without abnormal findings: Secondary | ICD-10-CM | POA: Diagnosis not present

## 2020-11-16 DIAGNOSIS — Z961 Presence of intraocular lens: Secondary | ICD-10-CM | POA: Diagnosis not present

## 2020-11-16 DIAGNOSIS — E113313 Type 2 diabetes mellitus with moderate nonproliferative diabetic retinopathy with macular edema, bilateral: Secondary | ICD-10-CM | POA: Diagnosis not present

## 2020-11-26 DIAGNOSIS — H43813 Vitreous degeneration, bilateral: Secondary | ICD-10-CM | POA: Diagnosis not present

## 2020-11-26 DIAGNOSIS — H3563 Retinal hemorrhage, bilateral: Secondary | ICD-10-CM | POA: Diagnosis not present

## 2020-11-26 DIAGNOSIS — H35033 Hypertensive retinopathy, bilateral: Secondary | ICD-10-CM | POA: Diagnosis not present

## 2020-11-26 DIAGNOSIS — E113313 Type 2 diabetes mellitus with moderate nonproliferative diabetic retinopathy with macular edema, bilateral: Secondary | ICD-10-CM | POA: Diagnosis not present

## 2020-11-27 DIAGNOSIS — E119 Type 2 diabetes mellitus without complications: Secondary | ICD-10-CM | POA: Diagnosis not present

## 2020-11-27 DIAGNOSIS — R809 Proteinuria, unspecified: Secondary | ICD-10-CM | POA: Diagnosis not present

## 2020-11-27 DIAGNOSIS — F17210 Nicotine dependence, cigarettes, uncomplicated: Secondary | ICD-10-CM | POA: Diagnosis not present

## 2020-11-27 DIAGNOSIS — Z6824 Body mass index (BMI) 24.0-24.9, adult: Secondary | ICD-10-CM | POA: Diagnosis not present

## 2020-11-27 DIAGNOSIS — F17200 Nicotine dependence, unspecified, uncomplicated: Secondary | ICD-10-CM | POA: Diagnosis not present

## 2020-11-27 DIAGNOSIS — R945 Abnormal results of liver function studies: Secondary | ICD-10-CM | POA: Diagnosis not present

## 2020-11-27 DIAGNOSIS — Z0000 Encounter for general adult medical examination without abnormal findings: Secondary | ICD-10-CM | POA: Diagnosis not present

## 2020-11-27 DIAGNOSIS — Z6825 Body mass index (BMI) 25.0-25.9, adult: Secondary | ICD-10-CM | POA: Diagnosis not present

## 2020-11-27 DIAGNOSIS — Z0001 Encounter for general adult medical examination with abnormal findings: Secondary | ICD-10-CM | POA: Diagnosis not present

## 2020-12-03 DIAGNOSIS — I10 Essential (primary) hypertension: Secondary | ICD-10-CM | POA: Diagnosis not present

## 2020-12-03 DIAGNOSIS — E785 Hyperlipidemia, unspecified: Secondary | ICD-10-CM | POA: Diagnosis not present

## 2020-12-03 DIAGNOSIS — E1165 Type 2 diabetes mellitus with hyperglycemia: Secondary | ICD-10-CM | POA: Diagnosis not present

## 2020-12-05 DIAGNOSIS — E113313 Type 2 diabetes mellitus with moderate nonproliferative diabetic retinopathy with macular edema, bilateral: Secondary | ICD-10-CM | POA: Diagnosis not present

## 2021-01-10 DIAGNOSIS — E113313 Type 2 diabetes mellitus with moderate nonproliferative diabetic retinopathy with macular edema, bilateral: Secondary | ICD-10-CM | POA: Diagnosis not present

## 2021-02-26 DIAGNOSIS — Z961 Presence of intraocular lens: Secondary | ICD-10-CM | POA: Diagnosis not present

## 2021-02-26 DIAGNOSIS — H40023 Open angle with borderline findings, high risk, bilateral: Secondary | ICD-10-CM | POA: Diagnosis not present

## 2021-02-26 DIAGNOSIS — E113313 Type 2 diabetes mellitus with moderate nonproliferative diabetic retinopathy with macular edema, bilateral: Secondary | ICD-10-CM | POA: Diagnosis not present

## 2021-03-04 DIAGNOSIS — I10 Essential (primary) hypertension: Secondary | ICD-10-CM | POA: Diagnosis not present

## 2021-03-04 DIAGNOSIS — Z125 Encounter for screening for malignant neoplasm of prostate: Secondary | ICD-10-CM | POA: Diagnosis not present

## 2021-03-04 DIAGNOSIS — E1165 Type 2 diabetes mellitus with hyperglycemia: Secondary | ICD-10-CM | POA: Diagnosis not present

## 2021-03-07 DIAGNOSIS — Z0001 Encounter for general adult medical examination with abnormal findings: Secondary | ICD-10-CM | POA: Diagnosis not present

## 2021-03-07 DIAGNOSIS — E1140 Type 2 diabetes mellitus with diabetic neuropathy, unspecified: Secondary | ICD-10-CM | POA: Diagnosis not present

## 2021-03-07 DIAGNOSIS — E785 Hyperlipidemia, unspecified: Secondary | ICD-10-CM | POA: Diagnosis not present

## 2021-03-07 DIAGNOSIS — N1831 Chronic kidney disease, stage 3a: Secondary | ICD-10-CM | POA: Diagnosis not present

## 2021-03-07 DIAGNOSIS — I10 Essential (primary) hypertension: Secondary | ICD-10-CM | POA: Diagnosis not present

## 2021-03-07 DIAGNOSIS — E1165 Type 2 diabetes mellitus with hyperglycemia: Secondary | ICD-10-CM | POA: Diagnosis not present

## 2021-03-07 DIAGNOSIS — Z9114 Patient's other noncompliance with medication regimen: Secondary | ICD-10-CM | POA: Diagnosis not present

## 2021-03-19 ENCOUNTER — Ambulatory Visit (INDEPENDENT_AMBULATORY_CARE_PROVIDER_SITE_OTHER): Payer: Medicare HMO

## 2021-03-19 ENCOUNTER — Other Ambulatory Visit: Payer: Self-pay

## 2021-03-19 ENCOUNTER — Ambulatory Visit (INDEPENDENT_AMBULATORY_CARE_PROVIDER_SITE_OTHER): Payer: Medicare HMO | Admitting: Podiatry

## 2021-03-19 ENCOUNTER — Encounter: Payer: Self-pay | Admitting: Podiatry

## 2021-03-19 DIAGNOSIS — L989 Disorder of the skin and subcutaneous tissue, unspecified: Secondary | ICD-10-CM

## 2021-03-19 DIAGNOSIS — E1140 Type 2 diabetes mellitus with diabetic neuropathy, unspecified: Secondary | ICD-10-CM

## 2021-03-19 DIAGNOSIS — M79675 Pain in left toe(s): Secondary | ICD-10-CM | POA: Diagnosis not present

## 2021-03-19 DIAGNOSIS — M79674 Pain in right toe(s): Secondary | ICD-10-CM | POA: Diagnosis not present

## 2021-03-19 DIAGNOSIS — E0843 Diabetes mellitus due to underlying condition with diabetic autonomic (poly)neuropathy: Secondary | ICD-10-CM | POA: Diagnosis not present

## 2021-03-19 DIAGNOSIS — B351 Tinea unguium: Secondary | ICD-10-CM | POA: Diagnosis not present

## 2021-03-19 DIAGNOSIS — M79671 Pain in right foot: Secondary | ICD-10-CM | POA: Diagnosis not present

## 2021-03-19 NOTE — Progress Notes (Signed)
   SUBJECTIVE Patient with a history of diabetes mellitus presents to office today complaining of elongated, thickened nails that cause pain while ambulating in shoes.  Patient is unable to trim their own nails. Patient is here for further evaluation and treatment.   Past Medical History:  Diagnosis Date   Diabetes ([place])    x 20 yrs   Diabetes mellitus    Hepatitis C     OBJECTIVE General Patient is awake, alert, and oriented x 3 and in no acute distress. Derm Skin is dry and supple bilateral. Negative open lesions or macerations. Remaining integument unremarkable. Nails are tender, long, thickened and dystrophic with subungual debris, consistent with onychomycosis, 1-5 bilateral. No signs of infection noted.  Symptomatic hyperkeratotic callus lesions also noted to the bilateral feet Vasc  DP and PT pedal pulses palpable bilaterally. Temperature gradient within normal limits.  Neuro Epicritic and protective threshold sensation diminished bilaterally.  Musculoskeletal Exam No symptomatic pedal deformities noted bilateral. Muscular strength within normal limits.  ASSESSMENT 1. Diabetes Mellitus w/ peripheral neuropathy 2.  Pain due to onychomycosis of toenails bilateral 3.  Preulcerative callus lesions bilateral feet  PLAN OF CARE 1. Patient evaluated today. 2. Instructed to maintain good pedal hygiene and foot care. Stressed importance of controlling blood sugar.  3. Mechanical debridement of nails 1-5 bilaterally performed using a nail nipper. Filed with dremel without incident.  4.  Excisional debridement of the hyperkeratotic preulcerative callus lesions was performed using a 312 scalpel without incident or bleeding.   5.  Return to clinic in 3 mos.     Edrick Kins, DPM [facility]  Dr. Edrick Kins, DPM    [address], [place] [address]                Office [phone]  Fax [phone]

## 2021-03-20 ENCOUNTER — Other Ambulatory Visit: Payer: Self-pay | Admitting: Podiatry

## 2021-03-20 DIAGNOSIS — E0843 Diabetes mellitus due to underlying condition with diabetic autonomic (poly)neuropathy: Secondary | ICD-10-CM

## 2021-03-29 NOTE — Progress Notes (Signed)
[facility] Note  04/03/2021     CHIEF COMPLAINT Patient presents for Retina Evaluation   HISTORY OF PRESENT ILLNESS: Jose Clark is a 70 y.o. male who presents to the clinic today for:   HPI     Retina Evaluation   In both eyes.  Duration of months.  I, the attending physician,  performed the HPI with the patient and updated documentation appropriately.        Comments   Retina eval per Dr. Kathlen Mody for DME OU- About 6 months ago pt had cataract sx.  Since then eye have not healed well due to the diabetes OD>OS per pt. C/o cloudy vision OD>OS.  Once morning his was looking at his overhead fan and it looked like it was turning when really it wasn't.  DM x25 yrs BS 100 before appt today.  120 this morning fasting.  A1C 9 PCP added Jardiance after A1C.  Pt states it has been a big improvement since starting this.       Last edited by Bernarda Caffey, MD on 04/05/2021  4:26 PM.    Pt is here on the referral of Dr. Kathlen Mody for concern of DME OU, pt states he has been diabetic for 24 years and is on insulin, he states blood sugar is under control now, but did not used to be   Referring physician: Hortencia Pilar, MD [place],  [place] [address]  HISTORICAL INFORMATION:   Selected notes from the MEDICAL RECORD NUMBER Referred by Dr. Kathlen Mody for DME LEE:  Ocular Hx- PMH-    CURRENT MEDICATIONS: No current outpatient medications on file. (Ophthalmic Drugs)   No current facility-administered medications for this visit. (Ophthalmic Drugs)   Current Outpatient Medications (Other)  Medication Sig   insulin NPH-insulin regular (NOVOLIN 70/30) (70-30) 100 UNIT/ML injection Inject 20-25 Units into the skin 2 (two) times daily with a meal.   JARDIANCE 25 MG TABS tablet    LANTUS SOLOSTAR 100 UNIT/ML Solostar Pen SMARTSIG:40 Unit(s) SUB-Q Daily   lisinopril (ZESTRIL) 10 MG tablet Take 10 mg by mouth daily.   RYBELSUS 7 MG TABS  Take 1 tablet by mouth daily. (Patient not taking: Reported on 04/03/2021)   No current facility-administered medications for this visit. (Other)      REVIEW OF SYSTEMS: ROS   Positive for: Endocrine, Eyes Negative for: Constitutional, Gastrointestinal, Neurological, Skin, Genitourinary, Musculoskeletal, HENT, Cardiovascular, Respiratory, Psychiatric, Allergic/Imm, Heme/Lymph Last edited by Leonie Douglas, COA on 04/03/2021  2:04 PM.       ALLERGIES No Known Allergies  PAST MEDICAL HISTORY Past Medical History:  Diagnosis Date   Diabetes ([place])    x 20 yrs   Diabetes mellitus    Hepatitis C    Past Surgical History:  Procedure Laterality Date   CIRCUMCISION     30 years ago   COLONOSCOPY N/A 01/24/2016   Procedure: COLONOSCOPY;  Surgeon: Rogene Houston, MD;  [facility];  Service: Endoscopy;  Laterality: N/A;  1130    FAMILY HISTORY History reviewed. No pertinent family history.  SOCIAL HISTORY Social History   Tobacco Use   Smoking status: Some Days   Smokeless tobacco: Never   Tobacco comments:    1 pack every couple of weeks x 10 year.  Substance Use Topics   Alcohol use: Yes    Alcohol/week: 0.0 standard drinks    Comment: occassionally  does not drink everyday. Occasional beer.  Drug use: Not Currently    Comment: cocaine         OPHTHALMIC EXAM:  Base Eye Exam     Visual Acuity (Snellen - Linear)       Right Left   Dist cc 20/40- 20/30+   Dist ph cc NI NI    Correction: Glasses         Tonometry (Tonopen, 2:12 PM)       Right Left   Pressure 11 11         Pupils       Dark Light Shape React APD   Right 1 0.5 Round Brisk None   Left 1 0.5 Round Brisk None         Visual Fields (Counting fingers)       Left Right    Full Full         Extraocular Movement       Right Left    Full Full         Neuro/Psych     Oriented x3: Yes   Mood/Affect: Normal         Dilation     Both eyes: 1.0%  Mydriacyl, 2.5% Phenylephrine @ 2:21 PM           Slit Lamp and Fundus Exam     External Exam       Right Left   External Normal Normal         Slit Lamp Exam       Right Left   Lids/Lashes Dermatochalasis - upper lid Dermatochalasis - upper lid   Conjunctiva/Sclera nasal and temporal pinguecula, Melanosis nasal and temporal pinguecula, Melanosis   Cornea arcus, 2+ Punctate epithelial erosions, well healed cataract wound arcus, 2+ Punctate epithelial erosions, well healed cataract wound   Anterior Chamber Deep and quiet Deep and quiet   Iris Round and dilated, No NVI Round and dilated, No NVI   Lens PC IOL in good position PC IOL in good position   Vitreous Vitreous syneresis Vitreous syneresis         Fundus Exam       Right Left   Disc Pink and Sharp, PPA Pink and Sharp, PPA, +cupping   C/D Ratio 0.6 0.7   Macula Blunted foveal reflex, scattered MA, cystic changes/edema temporal mac Flat, good foveal reflex, RPE mottling, scattered MA   Vessels attenuated, Tortuous, mild Copper wiring, early fibrotic NVE attenuated, Tortuous, mild Copper wiring, early NVE   Periphery Attached, scattered MA    Attached, scattered MA              Refraction     Wearing Rx       Sphere Cylinder Axis Add   Right -2.25 +1.75 088 +2.50   Left -0.75 +1.50 090 +2.50         Manifest Refraction       Sphere Cylinder Axis Dist VA   Right -1.25 +1.00 088 20/40   Left -0.75 +1.50 095 20/30+2            IMAGING AND PROCEDURES  Imaging and Procedures for 04/03/2021  OCT, Retina - OU - Both Eyes       Right Eye Quality was good. Central Foveal Thickness: 282. Progression has no prior data. Findings include normal foveal contour, intraretinal fluid, no SRF (+IRF/edema greatest temporal macula).   Left Eye Quality was good. Central Foveal Thickness: 283. Progression has no prior data. Findings include no SRF, normal foveal contour,  no IRF, vitreomacular adhesion .    Notes *Images captured and stored on drive  Diagnosis / Impression:  NFP OU OD: +IRF/DME OS: no IRF/SRF  Clinical management:  See below  Abbreviations: NFP - Normal foveal profile. CME - cystoid macular edema. PED - pigment epithelial detachment. IRF - intraretinal fluid. SRF - subretinal fluid. EZ - ellipsoid zone. ERM - epiretinal membrane. ORA - outer retinal atrophy. ORT - outer retinal tubulation. SRHM - subretinal hyper-reflective material. IRHM - intraretinal hyper-reflective material      Fluorescein Angiography Optos (Transit OD)       Right Eye Progression has no prior data. Early phase findings include microaneurysm, vascular perfusion defect, retinal neovascularization. Mid/Late phase findings include vascular perfusion defect, retinal neovascularization, microaneurysm, leakage (Early focal NV superior periphery and IT macula).   Left Eye Progression has no prior data. Early phase findings include microaneurysm, vascular perfusion defect, retinal neovascularization. Mid/Late phase findings include retinal neovascularization, pigment epithelial detachment, microaneurysm, leakage (Early focal NVE nasal periphery and ST arcades).   Notes **Images stored on drive**  Impression: PDR OU Focal early NVE OU Vascular perfusion defects OU Late leaking MA OU      Intravitreal Injection, Pharmacologic Agent - OD - Right Eye       Time Out 04/03/2021. 3:51 PM. Confirmed correct patient, procedure, site, and patient consented.   Anesthesia Topical anesthesia was used. Anesthetic medications included Lidocaine 2%, Proparacaine 0.5%.   Procedure Preparation included 5% betadine to ocular surface, eyelid speculum. A supplied (32g) needle was used.   Injection: 1.25 mg Bevacizumab 1.7m/0.05ml   Route: Intravitreal, Site: Right Eye   NDC: 50242-060-01, Lot:: [id number] Expiration date: 06/12/2021, Waste: 0.05 mL   Post-op Post injection exam found visual acuity of  at least counting fingers. The patient tolerated the procedure well. There were no complications. The patient received written and verbal post procedure care education.            ASSESSMENT/PLAN:    ICD-10-CM   1. Proliferative diabetic retinopathy of both eyes with macular edema associated with type 2 diabetes mellitus (HCC)  EG29.5284Intravitreal Injection, Pharmacologic Agent - OD - Right Eye    Bevacizumab (AVASTIN) SOLN 1.25 mg    2. Retinal edema  H35.81 OCT, Retina - OU - Both Eyes    3. Essential hypertension  I10     4. Hypertensive retinopathy of both eyes  H35.033 Fluorescein Angiography Optos (Transit OD)    CANCELED: Fluorescein Angiography Optos (Transit OD)    5. Pseudophakia, both eyes  Z96.1      1,2. Proliferative diabetic retinopathy, both eyes - The incidence, risk factors for progression, natural history and treatment options for diabetic retinopathy were discussed with patient.   - The need for close monitoring of blood glucose, blood pressure, and serum lipids, avoiding cigarette or any type of tobacco, and the need for long term follow up was also discussed with patient. - exam shows scattered MA, DBH, early NVE OU - FA (09.14.22) shows +NVE OU, late leaking MA's OU and vascular perfusion defect OU - OCT shows +diabetic macular edema OD  - The natural history, pathology, and characteristics of diabetic macular edema discussed with patient.  A generalized discussion of the major clinical trials concerning treatment of diabetic macular edema (ETDRS, DCT, SCORE, RISE / RIDE, and ongoing DRCR net studies) was completed.  This discussion included mention of the various approaches to treating diabetic macular edema (observation, laser photocoagulation, anti-VEGF injections with lucentis / Avastin /  Eylea, steroid injections with Kenalog / Ozurdex, and intraocular surgery with vitrectomy).  The goal hemoglobin A1C of 6-7 was discussed, as well as importance of smoking  cessation and hypertension control.  Need for ongoing treatment and monitoring were specifically discussed with reference to chronic nature of diabetic macular edema. - recommend IVA #1 OD today, 09.14.22 - pt wishes to proceed - RBA of procedure discussed, questions answered - informed consent obtained and signed - see procedure note - f/u Tuesday afternoon -- DFE/OCT, possible PRP laser   3,4. Hypertensive retinopathy OU - discussed importance of tight BP control - monitor  5. Pseudophakia OU  - s/p CE/IOL  - IOL in good position, doing well  - monitor  Ophthalmic Meds Ordered this visit:  Meds ordered this encounter  Medications   Bevacizumab (AVASTIN) SOLN 1.25 mg      Return for f/u Tuesday, Sept 20 for PDR OU, DFE, OCT, possible laser.  There are no Patient Instructions on file for this visit.   Explained the diagnoses, plan, and follow up with the patient and they expressed understanding.  Patient expressed understanding of the importance of proper follow up care.   This document serves as a record of services personally performed by Gardiner Sleeper, MD, PhD. It was created on their behalf by Orvan Falconer, an ophthalmic technician. The creation of this record is the provider's dictation and/or activities during the visit.    Electronically signed by: Orvan Falconer, OA, 04/05/21  9:09 PM   This document serves as a record of services personally performed by Gardiner Sleeper, MD, PhD. It was created on their behalf by San Jetty. Owens Shark, OA an ophthalmic technician. The creation of this record is the provider's dictation and/or activities during the visit.    Electronically signed by: San Jetty. Marguerita Merles 09.14.2022 9:09 PM   Gardiner Sleeper, M.D., Ph.D. Diseases & Surgery of the Retina and Vitreous [facility]  I have reviewed the above documentation for accuracy and completeness, and I agree with the above. Gardiner Sleeper, M.D., Ph.D.  04/05/21 9:09 PM  Abbreviations: M myopia (nearsighted); A astigmatism; H hyperopia (farsighted); P presbyopia; Mrx spectacle prescription;  CTL contact lenses; OD right eye; OS left eye; OU both eyes  XT exotropia; ET esotropia; PEK punctate epithelial keratitis; PEE punctate epithelial erosions; DES dry eye syndrome; MGD meibomian gland dysfunction; ATs artificial tears; PFAT's preservative free artificial tears; [place] nuclear sclerotic cataract; PSC posterior subcapsular cataract; ERM epi-retinal membrane; PVD posterior vitreous detachment; RD retinal detachment; DM diabetes mellitus; DR diabetic retinopathy; NPDR non-proliferative diabetic retinopathy; PDR proliferative diabetic retinopathy; CSME clinically significant macular edema; DME diabetic macular edema; dbh dot blot hemorrhages; CWS cotton wool spot; POAG primary open angle glaucoma; C/D cup-to-disc ratio; HVF humphrey visual field; GVF goldmann visual field; OCT optical coherence tomography; IOP intraocular pressure; BRVO Branch retinal vein occlusion; CRVO central retinal vein occlusion; CRAO central retinal artery occlusion; BRAO branch retinal artery occlusion; RT retinal tear; SB scleral buckle; PPV pars plana vitrectomy; VH Vitreous hemorrhage; PRP panretinal laser photocoagulation; IVK intravitreal kenalog; VMT vitreomacular traction; MH Macular hole;  NVD neovascularization of the disc; NVE neovascularization elsewhere; AREDS age related eye disease study; ARMD age related macular degeneration; POAG primary open angle glaucoma; EBMD epithelial/anterior basement membrane dystrophy; ACIOL anterior chamber intraocular lens; IOL intraocular lens; PCIOL posterior chamber intraocular lens; Phaco/IOL phacoemulsification with intraocular lens placement; PRK photorefractive keratectomy; LASIK laser assisted in situ keratomileusis; HTN hypertension; DM diabetes  mellitus; COPD chronic obstructive pulmonary disease

## 2021-04-03 ENCOUNTER — Other Ambulatory Visit: Payer: Self-pay

## 2021-04-03 ENCOUNTER — Ambulatory Visit (INDEPENDENT_AMBULATORY_CARE_PROVIDER_SITE_OTHER): Payer: Medicare HMO | Admitting: Ophthalmology

## 2021-04-03 DIAGNOSIS — I10 Essential (primary) hypertension: Secondary | ICD-10-CM | POA: Diagnosis not present

## 2021-04-03 DIAGNOSIS — H35033 Hypertensive retinopathy, bilateral: Secondary | ICD-10-CM

## 2021-04-03 DIAGNOSIS — Z961 Presence of intraocular lens: Secondary | ICD-10-CM | POA: Diagnosis not present

## 2021-04-03 DIAGNOSIS — E113513 Type 2 diabetes mellitus with proliferative diabetic retinopathy with macular edema, bilateral: Secondary | ICD-10-CM | POA: Diagnosis not present

## 2021-04-03 DIAGNOSIS — H3581 Retinal edema: Secondary | ICD-10-CM

## 2021-04-05 ENCOUNTER — Encounter (INDEPENDENT_AMBULATORY_CARE_PROVIDER_SITE_OTHER): Payer: Self-pay | Admitting: Ophthalmology

## 2021-04-05 MED ORDER — BEVACIZUMAB CHEMO INJECTION 1.25MG/0.05ML SYRINGE FOR KALEIDOSCOPE
1.2500 mg | INTRAVITREAL | Status: AC | PRN
  Administered 2021-04-03: 1.25 mg via INTRAVITREAL

## 2021-04-09 ENCOUNTER — Encounter (INDEPENDENT_AMBULATORY_CARE_PROVIDER_SITE_OTHER): Payer: Medicare HMO | Admitting: Ophthalmology

## 2021-04-09 ENCOUNTER — Encounter (INDEPENDENT_AMBULATORY_CARE_PROVIDER_SITE_OTHER): Payer: Self-pay

## 2021-04-09 ENCOUNTER — Other Ambulatory Visit: Payer: Self-pay

## 2021-04-16 ENCOUNTER — Encounter (INDEPENDENT_AMBULATORY_CARE_PROVIDER_SITE_OTHER): Payer: Medicare HMO | Admitting: Ophthalmology

## 2021-04-16 DIAGNOSIS — H35033 Hypertensive retinopathy, bilateral: Secondary | ICD-10-CM

## 2021-04-16 DIAGNOSIS — H3581 Retinal edema: Secondary | ICD-10-CM

## 2021-04-16 DIAGNOSIS — E113513 Type 2 diabetes mellitus with proliferative diabetic retinopathy with macular edema, bilateral: Secondary | ICD-10-CM

## 2021-04-16 DIAGNOSIS — I10 Essential (primary) hypertension: Secondary | ICD-10-CM

## 2021-04-16 DIAGNOSIS — Z961 Presence of intraocular lens: Secondary | ICD-10-CM

## 2021-04-30 ENCOUNTER — Encounter (INDEPENDENT_AMBULATORY_CARE_PROVIDER_SITE_OTHER): Payer: Medicare HMO | Admitting: Ophthalmology

## 2021-04-30 ENCOUNTER — Encounter (INDEPENDENT_AMBULATORY_CARE_PROVIDER_SITE_OTHER): Payer: Self-pay

## 2021-04-30 DIAGNOSIS — H3581 Retinal edema: Secondary | ICD-10-CM

## 2021-04-30 DIAGNOSIS — I10 Essential (primary) hypertension: Secondary | ICD-10-CM

## 2021-04-30 DIAGNOSIS — H35033 Hypertensive retinopathy, bilateral: Secondary | ICD-10-CM

## 2021-04-30 DIAGNOSIS — Z961 Presence of intraocular lens: Secondary | ICD-10-CM

## 2021-04-30 DIAGNOSIS — E113513 Type 2 diabetes mellitus with proliferative diabetic retinopathy with macular edema, bilateral: Secondary | ICD-10-CM

## 2021-05-03 NOTE — Progress Notes (Signed)
[facility] Note  05/07/2021     CHIEF COMPLAINT Patient presents for Retina Follow Up   HISTORY OF PRESENT ILLNESS: Jose Clark is a 70 y.o. male who presents to the clinic today for:   HPI     Retina Follow Up   Patient presents with  Diabetic Retinopathy.  In both eyes.  This started weeks ago.  Severity is moderate.  Duration of weeks.  Since onset it is stable.  I, the attending physician,  performed the HPI with the patient and updated documentation appropriately.        Comments   Pt states vision is the same OU.  Pt denies eye pain or discomfort.  Pt denies new or worsening floaters or fol OU.      Last edited by Bernarda Caffey, MD on 05/08/2021 11:02 PM.    Pt presents for delayed f/u -- missed appointment 2 wks ago which was for PRP laser. Here today at 4 wks post injection.   Referring physician: Hortencia Pilar, MD [place],  [place] [id number]  HISTORICAL INFORMATION:   Selected notes from the MEDICAL RECORD NUMBER Referred by Dr. Kathlen Mody for DME   CURRENT MEDICATIONS: No current outpatient medications on file. (Ophthalmic Drugs)   No current facility-administered medications for this visit. (Ophthalmic Drugs)   Current Outpatient Medications (Other)  Medication Sig   insulin NPH-insulin regular (NOVOLIN 70/30) (70-30) 100 UNIT/ML injection Inject 20-25 Units into the skin 2 (two) times daily with a meal.   JARDIANCE 25 MG TABS tablet    LANTUS SOLOSTAR 100 UNIT/ML Solostar Pen SMARTSIG:40 Unit(s) SUB-Q Daily   lisinopril (ZESTRIL) 10 MG tablet Take 10 mg by mouth daily.   RYBELSUS 7 MG TABS Take 1 tablet by mouth daily. (Patient not taking: Reported on 04/03/2021)   No current facility-administered medications for this visit. (Other)   REVIEW OF SYSTEMS: ROS   Positive for: Endocrine, Eyes Negative for: Constitutional, Gastrointestinal, Neurological, Skin, Genitourinary, Musculoskeletal,  HENT, Cardiovascular, Respiratory, Psychiatric, Allergic/Imm, Heme/Lymph Last edited by Doneen Poisson on 05/07/2021  2:26 PM.    ALLERGIES No Known Allergies  PAST MEDICAL HISTORY Past Medical History:  Diagnosis Date   Diabetes ([place])    x 20 yrs   Diabetes mellitus    Hepatitis C    Past Surgical History:  Procedure Laterality Date   CIRCUMCISION     30 years ago   COLONOSCOPY N/A 01/24/2016   Procedure: COLONOSCOPY;  Surgeon: Rogene Houston, MD;  [facility]: [facility];  Service: Endoscopy;  Laterality: N/A;  1130    FAMILY HISTORY History reviewed. No pertinent family history.  SOCIAL HISTORY Social History   Tobacco Use   Smoking status: Some Days   Smokeless tobacco: Never   Tobacco comments:    1 pack every couple of weeks x 10 year.  Substance Use Topics   Alcohol use: Yes    Alcohol/week: 0.0 standard drinks    Comment: occassionally  does not drink everyday. Occasional beer.   Drug use: Not Currently    Comment: cocaine       OPHTHALMIC EXAM: Base Eye Exam     Visual Acuity (Snellen - Linear)       Right Left   Dist cc 20/40 -1 20/40 -1   Dist ph cc NI NI    Correction: Glasses         Tonometry (Tonopen, 2:33 PM)  Right Left   Pressure 12 14         Pupils       Dark Light Shape React APD   Right 2 1 Round Minimal 0   Left 2 1 Round Minimal 0         Visual Fields       Left Right    Full Full         Extraocular Movement       Right Left    Full Full         Neuro/Psych     Oriented x3: Yes   Mood/Affect: Normal         Dilation     Both eyes: 1.0% Mydriacyl, 2.5% Phenylephrine @ 2:33 PM           Slit Lamp and Fundus Exam     External Exam       Right Left   External Normal Normal         Slit Lamp Exam       Right Left   Lids/Lashes Dermatochalasis - upper lid Dermatochalasis - upper lid   Conjunctiva/Sclera nasal and temporal pinguecula, Melanosis nasal and temporal  pinguecula, Melanosis   Cornea arcus, 1-2+ Punctate epithelial erosions, well healed cataract wound, mild tear film debris arcus, 1+ Punctate epithelial erosions, well healed cataract wound, mild tear film debris   Anterior Chamber Deep and quiet Deep and quiet   Iris Round and dilated, No NVI Round and dilated, No NVI   Lens PC IOL in good position PC IOL in good position   Vitreous Vitreous syneresis Vitreous syneresis         Fundus Exam       Right Left   Disc Pink and Sharp, PPA Pink and Sharp, PPA, +cupping, narrow inferior rim   C/D Ratio 0.6 0.7   Macula Blunted foveal reflex, scattered MA, cystic changes/edema temporal mac - slightly improved Flat, good foveal reflex, RPE mottling, scattered MA, no edema   Vessels attenuated, Tortuous, mild Copper wiring, early fibrotic NVE attenuated, Tortuous, mild Copper wiring, early NVE, mild AV crossing changes   Periphery Attached, scattered MA    Attached, scattered MA              Refraction     Wearing Rx       Sphere Cylinder Axis Add   Right -2.25 +1.75 088 +2.50   Left -0.75 +1.50 090 +2.50           IMAGING AND PROCEDURES  Imaging and Procedures for 05/07/2021  OCT, Retina - OU - Both Eyes       Right Eye Quality was good. Central Foveal Thickness: 274. Progression has improved. Findings include normal foveal contour, intraretinal fluid, no SRF (Mild interval improvement in IRF/edema greatest temporal macula).   Left Eye Quality was good. Central Foveal Thickness: 278. Progression has been stable. Findings include no SRF, normal foveal contour, no IRF, vitreomacular adhesion .   Notes *Images captured and stored on drive  Diagnosis / Impression:  NFP OU OD: Mild interval improvement in IRF/edema greatest temporal macula OS: no IRF/SRF  Clinical management:  See below  Abbreviations: NFP - Normal foveal profile. CME - cystoid macular edema. PED - pigment epithelial detachment. IRF - intraretinal fluid.  SRF - subretinal fluid. EZ - ellipsoid zone. ERM - epiretinal membrane. ORA - outer retinal atrophy. ORT - outer retinal tubulation. SRHM - subretinal hyper-reflective material. IRHM - intraretinal hyper-reflective material  Intravitreal Injection, Pharmacologic Agent - OD - Right Eye       Time Out 05/07/2021. 3:27 PM. Confirmed correct patient, procedure, site, and patient consented.   Anesthesia Topical anesthesia was used. Anesthetic medications included Lidocaine 2%, Proparacaine 0.5%.   Procedure Preparation included 5% betadine to ocular surface, eyelid speculum. A supplied needle was used.   Injection: 1.25 mg Bevacizumab 1.15m/0.05ml   Route: Intravitreal, Site: Right Eye   NDC:: 03559-741-63 Lot: [id number] , Expiration date: 06/12/2021, Waste: 0 mL   Post-op Post injection exam found visual acuity of at least counting fingers. The patient tolerated the procedure well. There were no complications. The patient received written and verbal post procedure care education. Post injection medications were not given.            ASSESSMENT/PLAN:    ICD-10-CM   1. Proliferative diabetic retinopathy of both eyes with macular edema associated with type 2 diabetes mellitus (HCC)  EA45.3646Intravitreal Injection, Pharmacologic Agent - OD - Right Eye    Bevacizumab (AVASTIN) SOLN 1.25 mg    2. Retinal edema  H35.81 OCT, Retina - OU - Both Eyes    3. Essential hypertension  I10     4. Hypertensive retinopathy of both eyes  H35.033     5. Pseudophakia, both eyes  Z96.1      1,2. Proliferative diabetic retinopathy, both eyes  - delayed f/u -- was supposed to return for PRP, but now 4 wks post IVA #1 OD - exam shows scattered MA, DBH, early NVE OU - FA (09.14.22) shows +NVE OU, late leaking MA's OU and vascular perfusion defect OU - s/p IVA OD #1 (09.14.22) - OCT shows mild interval improvement in IRF/DME OD  - recommend IVA #2 OD today, 10.18.22 - pt wishes to  proceed - RBA of procedure discussed, questions answered - informed consent obtained and signed - see procedure note - f/u 2-3 weeks -- DFE/OCT, possible PRP laser   3,4. Hypertensive retinopathy OU - discussed importance of tight BP control - monitor  5. Pseudophakia OU  - s/p CE/IOL  - IOL in good position, doing well  - monitor  Ophthalmic Meds Ordered this visit:  Meds ordered this encounter  Medications   Bevacizumab (AVASTIN) SOLN 1.25 mg     Return for f/u 2-3 weeks, PDR OU, DFE, OCT, possible PRP.  There are no Patient Instructions on file for this visit.   Explained the diagnoses, plan, and follow up with the patient and they expressed understanding.  Patient expressed understanding of the importance of proper follow up care.   This document serves as a record of services personally performed by BGardiner Sleeper MD, PhD. It was created on their behalf by AEstill Bakes COT an ophthalmic technician. The creation of this record is the provider's dictation and/or activities during the visit.    Electronically signed by: AEstill Bakes COT 10.14.22 @ 11:15 PM   This document serves as a record of services personally performed by BGardiner Sleeper MD, PhD. It was created on their behalf by ASan Jetty BOwens Shark OA an ophthalmic technician. The creation of this record is the provider's dictation and/or activities during the visit.    Electronically signed by: ASan Jetty BOwens Shark O[place]10.18.2022 11:15 PM  BGardiner Sleeper M.D., Ph.D. Diseases & Surgery of the Retina and Vitreous [facility] I have reviewed the above documentation for accuracy and completeness, and I agree with the above. BGardiner Sleeper M.D., Ph.D.  05/08/21 11:15 PM  Abbreviations: M myopia (nearsighted); A astigmatism; H hyperopia (farsighted); P presbyopia; Mrx spectacle prescription;  CTL contact lenses; OD right eye; OS left eye; OU both eyes  XT exotropia; ET esotropia; PEK punctate  epithelial keratitis; PEE punctate epithelial erosions; DES dry eye syndrome; MGD meibomian gland dysfunction; ATs artificial tears; PFAT's preservative free artificial tears; [place] nuclear sclerotic cataract; PSC posterior subcapsular cataract; ERM epi-retinal membrane; PVD posterior vitreous detachment; RD retinal detachment; DM diabetes mellitus; DR diabetic retinopathy; NPDR non-proliferative diabetic retinopathy; PDR proliferative diabetic retinopathy; CSME clinically significant macular edema; DME diabetic macular edema; dbh dot blot hemorrhages; CWS cotton wool spot; POAG primary open angle glaucoma; C/D cup-to-disc ratio; HVF humphrey visual field; GVF goldmann visual field; OCT optical coherence tomography; IOP intraocular pressure; BRVO Branch retinal vein occlusion; CRVO central retinal vein occlusion; CRAO central retinal artery occlusion; BRAO branch retinal artery occlusion; RT retinal tear; SB scleral buckle; PPV pars plana vitrectomy; VH Vitreous hemorrhage; PRP panretinal laser photocoagulation; IVK intravitreal kenalog; VMT vitreomacular traction; MH Macular hole;  NVD neovascularization of the disc; NVE neovascularization elsewhere; AREDS age related eye disease study; ARMD age related macular degeneration; POAG primary open angle glaucoma; EBMD epithelial/anterior basement membrane dystrophy; ACIOL anterior chamber intraocular lens; IOL intraocular lens; PCIOL posterior chamber intraocular lens; Phaco/IOL phacoemulsification with intraocular lens placement; [place] photorefractive keratectomy; LASIK laser assisted in situ keratomileusis; HTN hypertension; DM diabetes mellitus; COPD chronic obstructive pulmonary disease

## 2021-05-07 ENCOUNTER — Other Ambulatory Visit: Payer: Self-pay

## 2021-05-07 ENCOUNTER — Ambulatory Visit (INDEPENDENT_AMBULATORY_CARE_PROVIDER_SITE_OTHER): Payer: Medicare HMO | Admitting: Ophthalmology

## 2021-05-07 DIAGNOSIS — H35033 Hypertensive retinopathy, bilateral: Secondary | ICD-10-CM | POA: Diagnosis not present

## 2021-05-07 DIAGNOSIS — Z961 Presence of intraocular lens: Secondary | ICD-10-CM | POA: Diagnosis not present

## 2021-05-07 DIAGNOSIS — I10 Essential (primary) hypertension: Secondary | ICD-10-CM

## 2021-05-07 DIAGNOSIS — E113513 Type 2 diabetes mellitus with proliferative diabetic retinopathy with macular edema, bilateral: Secondary | ICD-10-CM

## 2021-05-07 DIAGNOSIS — H3581 Retinal edema: Secondary | ICD-10-CM

## 2021-05-08 ENCOUNTER — Encounter (INDEPENDENT_AMBULATORY_CARE_PROVIDER_SITE_OTHER): Payer: Self-pay | Admitting: Ophthalmology

## 2021-05-08 MED ORDER — BEVACIZUMAB CHEMO INJECTION 1.25MG/0.05ML SYRINGE FOR KALEIDOSCOPE
1.2500 mg | INTRAVITREAL | Status: AC | PRN
Start: 2021-05-07 — End: 2021-05-07
  Administered 2021-05-07: 1.25 mg via INTRAVITREAL

## 2021-05-16 NOTE — Progress Notes (Signed)
[facility] Note  05/24/2021     CHIEF COMPLAINT Patient presents for Retina Follow Up   HISTORY OF PRESENT ILLNESS: Jose Clark is a 70 y.o. male who presents to the clinic today for:   HPI     Retina Follow Up   Patient presents with  Diabetic Retinopathy.  In both eyes.  This started 2.5 weeks ago.  I, the attending physician,  performed the HPI with the patient and updated documentation appropriately.        Comments   Patient here for 2 1/2 weeks retina follow up for PDR OU. Patient states vision doing the same. No eye pain.       Last edited by Bernarda Caffey, MD on 05/24/2021  4:37 PM.    Pt presents for PRP OS today   Referring physician: Celene Squibb, MD [place],  [place] [id number]  HISTORICAL INFORMATION:   Selected notes from the MEDICAL RECORD NUMBER Referred by Dr. Kathlen Mody for DME   CURRENT MEDICATIONS: Current Outpatient Medications (Ophthalmic Drugs)  Medication Sig   prednisoLONE acetate (PRED FORTE) 1 % ophthalmic suspension Place 1 drop into the left eye 4 (four) times daily for 7 days.   No current facility-administered medications for this visit. (Ophthalmic Drugs)   Current Outpatient Medications (Other)  Medication Sig   insulin NPH-insulin regular (NOVOLIN 70/30) (70-30) 100 UNIT/ML injection Inject 20-25 Units into the skin 2 (two) times daily with a meal.   JARDIANCE 25 MG TABS tablet    LANTUS SOLOSTAR 100 UNIT/ML Solostar Pen SMARTSIG:40 Unit(s) SUB-Q Daily   lisinopril (ZESTRIL) 10 MG tablet Take 10 mg by mouth daily.   RYBELSUS 7 MG TABS Take 1 tablet by mouth daily. (Patient not taking: Reported on 04/03/2021)   No current facility-administered medications for this visit. (Other)   REVIEW OF SYSTEMS: ROS   Positive for: Endocrine, Eyes Negative for: Constitutional, Gastrointestinal, Neurological, Skin, Genitourinary, Musculoskeletal, HENT, Cardiovascular, Respiratory, Psychiatric,  Allergic/Imm, Heme/Lymph Last edited by Theodore Demark, COA on 05/24/2021  1:47 PM.     ALLERGIES No Known Allergies  PAST MEDICAL HISTORY Past Medical History:  Diagnosis Date   Diabetes ([place])    x 20 yrs   Diabetes mellitus    Hepatitis C    Past Surgical History:  Procedure Laterality Date   CIRCUMCISION     30 years ago   COLONOSCOPY N/A 01/24/2016   Procedure: COLONOSCOPY;  Surgeon: Rogene Houston, MD;  [facility];  Service: Endoscopy;  Laterality: N/A;  1130    FAMILY HISTORY History reviewed. No pertinent family history.  SOCIAL HISTORY Social History   Tobacco Use   Smoking status: Some Days   Smokeless tobacco: Never   Tobacco comments:    1 pack every couple of weeks x 10 year.  Substance Use Topics   Alcohol use: Yes    Alcohol/week: 0.0 standard drinks    Comment: occassionally  does not drink everyday. Occasional beer.   Drug use: Not Currently    Comment: cocaine       OPHTHALMIC EXAM: Base Eye Exam     Visual Acuity (Snellen - Linear)       Right Left   Dist cc 20/40 -2 20/40 -1   Dist ph cc NI 20/40 +2    Correction: Glasses         Tonometry (Tonopen, 1:44 PM)       Right Left  Pressure 15 14         Pupils       Dark Light Shape React APD   Right 2 1 Round Minimal None   Left 2 1 Round Minimal None         Visual Fields (Counting fingers)       Left Right    Full Full         Extraocular Movement       Right Left    Full Full         Neuro/Psych     Oriented x3: Yes   Mood/Affect: Normal         Dilation     Both eyes: 1.0% Mydriacyl, 2.5% Phenylephrine @ 1:44 PM           Slit Lamp and Fundus Exam     External Exam       Right Left   External Normal Normal         Slit Lamp Exam       Right Left   Lids/Lashes Dermatochalasis - upper lid Dermatochalasis - upper lid   Conjunctiva/Sclera nasal and temporal pinguecula, Melanosis nasal and temporal pinguecula,  Melanosis   Cornea arcus, 1-2+ Punctate epithelial erosions, well healed cataract wound, mild tear film debris arcus, 1+ Punctate epithelial erosions, well healed cataract wound, mild tear film debris   Anterior Chamber Deep and quiet Deep and quiet   Iris Round and dilated, No NVI Round and dilated, No NVI   Lens PC IOL in good position PC IOL in good position   Vitreous Vitreous syneresis Vitreous syneresis         Fundus Exam       Right Left   Disc Pink and Sharp, PPA Pink and Sharp, PPA, +cupping, narrow inferior rim   C/D Ratio 0.6 0.7   Macula good foveal reflex, scattered MA, cystic changes/edema temporal mac - persistent Flat, good foveal reflex, RPE mottling, scattered MA, no edema   Vessels attenuated, Tortuous, mild Copper wiring, early fibrotic NVE attenuated, Tortuous, mild Copper wiring, early NVE, mild AV crossing changes   Periphery Attached, scattered MA    Attached, scattered MA              Refraction     Wearing Rx       Sphere Cylinder Axis Add   Right -2.25 +1.75 088 +2.50   Left -0.75 +1.50 090 +2.50           IMAGING AND PROCEDURES  Imaging and Procedures for 05/24/2021  OCT, Retina - OU - Both Eyes       Right Eye Quality was good. Central Foveal Thickness: 272. Progression has been stable. Findings include normal foveal contour, intraretinal fluid, no SRF (Persistent IRF/edema greatest temporal macula).   Left Eye Quality was good. Central Foveal Thickness: 277. Progression has been stable. Findings include no SRF, normal foveal contour, no IRF, vitreomacular adhesion .   Notes *Images captured and stored on drive  Diagnosis / Impression:  NFP OU [id number] IRF/edema greatest temporal macula OS: no IRF/SRF  Clinical management:  See below  Abbreviations: NFP - Normal foveal profile. CME - cystoid macular edema. PED - pigment epithelial detachment. IRF - intraretinal fluid. SRF - subretinal fluid. EZ - ellipsoid zone. ERM -  epiretinal membrane. ORA - outer retinal atrophy. ORT - outer retinal tubulation. SRHM - subretinal hyper-reflective material. IRHM - intraretinal hyper-reflective material      Panretinal Photocoagulation - OS -  Left Eye       LASER PROCEDURE NOTE  Diagnosis:   Proliferative Diabetic Retinopathy, LEFT EYE  Procedure:  Pan-retinal photocoagulation using slit lamp laser, LEFT EYE  Anesthesia:  Topical  Surgeon: Bernarda Caffey, MD, PhD   Informed consent obtained, operative eye marked, and time out performed prior to initiation of laser.   Lumenis [id number] slit lamp laser Pattern: 2x2 square, 3x3 square Power: 270 mW Duration: 30 msec  Spot size: 200 microns  # spots:  287 -- targeted to focal NV nasal to disc and along IT arcades   Complications: None.  Notes: pt with significant discomfort and was unable to complete full PRP treatment  RTC: 2-3 wks -- DFE/OCT, possible injection  Patient tolerated the procedure well and received written and verbal post-procedure care information/education.             ASSESSMENT/PLAN:    ICD-10-CM   1. Proliferative diabetic retinopathy of both eyes with macular edema associated with type 2 diabetes mellitus ([place])  S28.3151 Panretinal Photocoagulation - OS - Left Eye    2. Retinal edema  H35.81 OCT, Retina - OU - Both Eyes    3. Essential hypertension  I10     4. Hypertensive retinopathy of both eyes  H35.033     5. Pseudophakia, both eyes  Z96.1      1,2. Proliferative diabetic retinopathy, both eyes - s/p IVA OD #1 (09.14.22), #2 (10.18.22) - exam shows scattered MA, DBH, early NVE OU - FA (09.14.22) shows +focal NVE OU, late leaking MA's OU and vascular perfusion defect OU - OCT shows persistent IRF/DME OD  - recommend PRP OS today, 11.04.22 - pt wishes to proceed - RBA of procedure discussed, questions answered - informed consent obtained and signed - see procedure note -- pt unable to tolerate laser with topical  anesthesia - start PF QID OS x7 days **will need subconj block for PRP** - f/u Nov. 18 or later -- DFE/OCT, possible injection  3,4. Hypertensive retinopathy OU - discussed importance of tight BP control - monitor   5. Pseudophakia OU  - s/p CE/IOL  - IOL in good position, doing well  - monitor   Ophthalmic Meds Ordered this visit:  Meds ordered this encounter  Medications   prednisoLONE acetate (PRED FORTE) 1 % ophthalmic suspension    Sig: Place 1 drop into the left eye 4 (four) times daily for 7 days.    Dispense:  10 mL    Refill:  0     Return for f/u Nov 15 or later PDR, DFE, OCT.  There are no Patient Instructions on file for this visit.   Explained the diagnoses, plan, and follow up with the patient and they expressed understanding.  Patient expressed understanding of the importance of proper follow up care.   This document serves as a record of services personally performed by Gardiner Sleeper, MD, PhD. It was created on their behalf by Leonie Douglas, an ophthalmic technician. The creation of this record is the provider's dictation and/or activities during the visit.    Electronically signed by: Leonie Douglas COA, 05/24/21  4:40 PM  This document serves as a record of services personally performed by Gardiner Sleeper, MD, PhD. It was created on their behalf by San Jetty. Owens Shark, OA an ophthalmic technician. The creation of this record is the provider's dictation and/or activities during the visit.    Electronically signed by: San Jetty. [place], [place] 11.04.2022 4:40 PM  Sharyon Cable.  Coralyn Pear, M.D., Ph.D. Diseases & Surgery of the Retina and [place] 05/24/2021  I have reviewed the above documentation for accuracy and completeness, and I agree with the above. Gardiner Sleeper, M.D., Ph.D. 05/24/21 4:40 PM   Abbreviations: M myopia (nearsighted); A astigmatism; H hyperopia (farsighted); P presbyopia; Mrx spectacle prescription;  CTL contact  lenses; OD right eye; OS left eye; OU both eyes  XT exotropia; ET esotropia; PEK punctate epithelial keratitis; PEE punctate epithelial erosions; DES dry eye syndrome; MGD meibomian gland dysfunction; ATs artificial tears; PFAT's preservative free artificial tears; [place] nuclear sclerotic cataract; PSC posterior subcapsular cataract; ERM epi-retinal membrane; PVD posterior vitreous detachment; RD retinal detachment; DM diabetes mellitus; DR diabetic retinopathy; NPDR non-proliferative diabetic retinopathy; PDR proliferative diabetic retinopathy; CSME clinically significant macular edema; DME diabetic macular edema; dbh dot blot hemorrhages; CWS cotton wool spot; POAG primary open angle glaucoma; C/D cup-to-disc ratio; HVF humphrey visual field; GVF goldmann visual field; OCT optical coherence tomography; IOP intraocular pressure; BRVO Branch retinal vein occlusion; CRVO central retinal vein occlusion; CRAO central retinal artery occlusion; BRAO branch retinal artery occlusion; RT retinal tear; SB scleral buckle; PPV pars plana vitrectomy; VH Vitreous hemorrhage; PRP panretinal laser photocoagulation; IVK intravitreal kenalog; VMT vitreomacular traction; MH Macular hole;  NVD neovascularization of the disc; NVE neovascularization elsewhere; AREDS age related eye disease study; ARMD age related macular degeneration; POAG primary open angle glaucoma; EBMD epithelial/anterior basement membrane dystrophy; ACIOL anterior chamber intraocular lens; IOL intraocular lens; PCIOL posterior chamber intraocular lens; Phaco/IOL phacoemulsification with intraocular lens placement; [place] photorefractive keratectomy; LASIK laser assisted in situ keratomileusis; HTN hypertension; DM diabetes mellitus; COPD chronic obstructive pulmonary disease

## 2021-05-24 ENCOUNTER — Other Ambulatory Visit: Payer: Self-pay

## 2021-05-24 ENCOUNTER — Encounter (INDEPENDENT_AMBULATORY_CARE_PROVIDER_SITE_OTHER): Payer: Self-pay | Admitting: Ophthalmology

## 2021-05-24 ENCOUNTER — Ambulatory Visit (INDEPENDENT_AMBULATORY_CARE_PROVIDER_SITE_OTHER): Payer: Medicare HMO | Admitting: Ophthalmology

## 2021-05-24 DIAGNOSIS — Z961 Presence of intraocular lens: Secondary | ICD-10-CM | POA: Diagnosis not present

## 2021-05-24 DIAGNOSIS — H35033 Hypertensive retinopathy, bilateral: Secondary | ICD-10-CM

## 2021-05-24 DIAGNOSIS — I10 Essential (primary) hypertension: Secondary | ICD-10-CM | POA: Diagnosis not present

## 2021-05-24 DIAGNOSIS — H3581 Retinal edema: Secondary | ICD-10-CM

## 2021-05-24 DIAGNOSIS — E113513 Type 2 diabetes mellitus with proliferative diabetic retinopathy with macular edema, bilateral: Secondary | ICD-10-CM | POA: Diagnosis not present

## 2021-05-24 MED ORDER — PREDNISOLONE ACETATE 1 % OP SUSP: 1.0000 [drp] | Freq: Four times a day (QID) | OPHTHALMIC | 0 refills | Status: AC

## 2021-06-03 NOTE — Progress Notes (Shared)
[facility] Note  06/04/2021     CHIEF COMPLAINT Patient presents for No chief complaint on file.   HISTORY OF PRESENT ILLNESS: Jose Clark is a 70 y.o. male who presents to the clinic today for:    Pt presents for PRP OS today   Referring physician: Celene Squibb, MD [place],  [place] [address]  HISTORICAL INFORMATION:   Selected notes from the MEDICAL RECORD NUMBER Referred by Dr. Kathlen Mody for DME   CURRENT MEDICATIONS: No current outpatient medications on file. (Ophthalmic Drugs)   No current facility-administered medications for this visit. (Ophthalmic Drugs)   Current Outpatient Medications (Other)  Medication Sig   insulin NPH-insulin regular (NOVOLIN 70/30) (70-30) 100 UNIT/ML injection Inject 20-25 Units into the skin 2 (two) times daily with a meal.   JARDIANCE 25 MG TABS tablet    LANTUS SOLOSTAR 100 UNIT/ML Solostar Pen SMARTSIG:40 Unit(s) SUB-Q Daily   lisinopril (ZESTRIL) 10 MG tablet Take 10 mg by mouth daily.   RYBELSUS 7 MG TABS Take 1 tablet by mouth daily. (Patient not taking: Reported on 04/03/2021)   No current facility-administered medications for this visit. (Other)   REVIEW OF SYSTEMS:   ALLERGIES No Known Allergies  PAST MEDICAL HISTORY Past Medical History:  Diagnosis Date   Diabetes ([place])    x 20 yrs   Diabetes mellitus    Hepatitis C    Past Surgical History:  Procedure Laterality Date   CIRCUMCISION     30 years ago   COLONOSCOPY N/A 01/24/2016   Procedure: COLONOSCOPY;  Surgeon: Rogene Houston, MD;  [facility];  Service: Endoscopy;  Laterality: N/A;  1130    FAMILY HISTORY No family history on file.  SOCIAL HISTORY Social History   Tobacco Use   Smoking status: Some Days   Smokeless tobacco: Never   Tobacco comments:    1 pack every couple of weeks x 10 year.  Substance Use Topics   Alcohol use: Yes    Alcohol/week: 0.0 standard drinks    Comment:  occassionally  does not drink everyday. Occasional beer.   Drug use: Not Currently    Comment: cocaine       OPHTHALMIC EXAM: Not recorded    IMAGING AND PROCEDURES  Imaging and Procedures for 06/04/2021           ASSESSMENT/PLAN:    ICD-10-CM   1. Proliferative diabetic retinopathy of both eyes with macular edema associated with type 2 diabetes mellitus ([place])  U44.0347     2. Retinal edema  H35.81     3. Essential hypertension  I10     4. Hypertensive retinopathy of both eyes  H35.033     5. Pseudophakia, both eyes  Z96.1      1,2. Proliferative diabetic retinopathy, both eyes - s/p IVA OD #1 (09.14.22), #2 (10.18.22) - s/p PRP OS (11.04.22) - exam shows scattered MA, DBH, early NVE OU - FA (09.14.22) shows +focal NVE OU, late leaking MA's OU and vascular perfusion defect OU - OCT shows persistent IRF/DME OD  - recommend IVA OD today, 11.15.22 - pt wishes to proceed - RBA of procedure discussed, questions answered - informed consent obtained and signed - see procedure note -- pt unable to tolerate laser with topical anesthesia **will need subconj block for PRP** - f/u Nov. 18 or later -- DFE/OCT, possible injection  3,4. Hypertensive retinopathy OU - discussed importance of tight BP control -  monitor   5. Pseudophakia OU  - s/p CE/IOL  - IOL in good position, doing well  - monitor   Ophthalmic Meds Ordered this visit:  No orders of the defined types were placed in this encounter.    No follow-ups on file.  There are no Patient Instructions on file for this visit.   Explained the diagnoses, plan, and follow up with the patient and they expressed understanding.  Patient expressed understanding of the importance of proper follow up care.   This document serves as a record of services personally performed by Gardiner Sleeper, MD, PhD. It was created on their behalf by San Jetty. Owens Shark, OA an ophthalmic technician. The creation of this record is the  provider's dictation and/or activities during the visit.    Electronically signed by: San Jetty. Owens Shark, [place] 11.14.2022 12:08 PM   Gardiner Sleeper, M.D., Ph.D. Diseases & Surgery of the Retina and Vitreous [facility]: M myopia (nearsighted); A astigmatism; H hyperopia (farsighted); P presbyopia; Mrx spectacle prescription;  CTL contact lenses; OD right eye; OS left eye; OU both eyes  XT exotropia; ET esotropia; PEK punctate epithelial keratitis; PEE punctate epithelial erosions; DES dry eye syndrome; MGD meibomian gland dysfunction; ATs artificial tears; PFAT's preservative free artificial tears; [place] nuclear sclerotic cataract; PSC posterior subcapsular cataract; ERM epi-retinal membrane; PVD posterior vitreous detachment; RD retinal detachment; DM diabetes mellitus; DR diabetic retinopathy; NPDR non-proliferative diabetic retinopathy; PDR proliferative diabetic retinopathy; CSME clinically significant macular edema; DME diabetic macular edema; dbh dot blot hemorrhages; CWS cotton wool spot; POAG primary open angle glaucoma; C/D cup-to-disc ratio; HVF humphrey visual field; GVF goldmann visual field; OCT optical coherence tomography; IOP intraocular pressure; BRVO Branch retinal vein occlusion; CRVO central retinal vein occlusion; CRAO central retinal artery occlusion; BRAO branch retinal artery occlusion; RT retinal tear; SB scleral buckle; PPV pars plana vitrectomy; VH Vitreous hemorrhage; PRP panretinal laser photocoagulation; IVK intravitreal kenalog; VMT vitreomacular traction; MH Macular hole;  NVD neovascularization of the disc; NVE neovascularization elsewhere; AREDS age related eye disease study; ARMD age related macular degeneration; POAG primary open angle glaucoma; EBMD epithelial/anterior basement membrane dystrophy; ACIOL anterior chamber intraocular lens; IOL intraocular lens; PCIOL posterior chamber intraocular lens; Phaco/IOL phacoemulsification with  intraocular lens placement; [place] photorefractive keratectomy; LASIK laser assisted in situ keratomileusis; HTN hypertension; DM diabetes mellitus; COPD chronic obstructive pulmonary disease

## 2021-06-04 ENCOUNTER — Encounter (INDEPENDENT_AMBULATORY_CARE_PROVIDER_SITE_OTHER): Payer: Medicare HMO | Admitting: Ophthalmology

## 2021-06-04 DIAGNOSIS — Z961 Presence of intraocular lens: Secondary | ICD-10-CM

## 2021-06-04 DIAGNOSIS — H3581 Retinal edema: Secondary | ICD-10-CM

## 2021-06-04 DIAGNOSIS — E113513 Type 2 diabetes mellitus with proliferative diabetic retinopathy with macular edema, bilateral: Secondary | ICD-10-CM

## 2021-06-04 DIAGNOSIS — H35033 Hypertensive retinopathy, bilateral: Secondary | ICD-10-CM

## 2021-06-04 DIAGNOSIS — I10 Essential (primary) hypertension: Secondary | ICD-10-CM

## 2021-06-10 DIAGNOSIS — Z125 Encounter for screening for malignant neoplasm of prostate: Secondary | ICD-10-CM | POA: Diagnosis not present

## 2021-06-10 DIAGNOSIS — E1165 Type 2 diabetes mellitus with hyperglycemia: Secondary | ICD-10-CM | POA: Diagnosis not present

## 2021-06-10 DIAGNOSIS — I10 Essential (primary) hypertension: Secondary | ICD-10-CM | POA: Diagnosis not present

## 2021-06-10 DIAGNOSIS — F5221 Male erectile disorder: Secondary | ICD-10-CM | POA: Diagnosis not present

## 2021-06-12 DIAGNOSIS — E1140 Type 2 diabetes mellitus with diabetic neuropathy, unspecified: Secondary | ICD-10-CM | POA: Diagnosis not present

## 2021-06-12 DIAGNOSIS — I10 Essential (primary) hypertension: Secondary | ICD-10-CM | POA: Diagnosis not present

## 2021-06-12 DIAGNOSIS — N1831 Chronic kidney disease, stage 3a: Secondary | ICD-10-CM | POA: Diagnosis not present

## 2021-06-12 DIAGNOSIS — E1165 Type 2 diabetes mellitus with hyperglycemia: Secondary | ICD-10-CM | POA: Diagnosis not present

## 2021-06-12 DIAGNOSIS — Z0001 Encounter for general adult medical examination with abnormal findings: Secondary | ICD-10-CM | POA: Diagnosis not present

## 2021-06-12 DIAGNOSIS — E785 Hyperlipidemia, unspecified: Secondary | ICD-10-CM | POA: Diagnosis not present

## 2021-06-21 ENCOUNTER — Ambulatory Visit: Payer: Medicare HMO | Admitting: Podiatry

## 2021-06-27 NOTE — Progress Notes (Shared)
[facility] Note  07/01/2021     CHIEF COMPLAINT Patient presents for No chief complaint on file.   HISTORY OF PRESENT ILLNESS: Jose Clark is a 70 y.o. male who presents to the clinic today for:    Pt presents for PRP OS today   Referring physician: Celene Squibb, MD [place],  [place] [address]  HISTORICAL INFORMATION:   Selected notes from the MEDICAL RECORD NUMBER Referred by Dr. Kathlen Mody for DME   CURRENT MEDICATIONS: No current outpatient medications on file. (Ophthalmic Drugs)   No current facility-administered medications for this visit. (Ophthalmic Drugs)   Current Outpatient Medications (Other)  Medication Sig   insulin NPH-insulin regular (NOVOLIN 70/30) (70-30) 100 UNIT/ML injection Inject 20-25 Units into the skin 2 (two) times daily with a meal.   JARDIANCE 25 MG TABS tablet    LANTUS SOLOSTAR 100 UNIT/ML Solostar Pen SMARTSIG:40 Unit(s) SUB-Q Daily   lisinopril (ZESTRIL) 10 MG tablet Take 10 mg by mouth daily.   RYBELSUS 7 MG TABS Take 1 tablet by mouth daily. (Patient not taking: Reported on 04/03/2021)   No current facility-administered medications for this visit. (Other)   REVIEW OF SYSTEMS:   ALLERGIES No Known Allergies  PAST MEDICAL HISTORY Past Medical History:  Diagnosis Date   Diabetes ([place])    x 20 yrs   Diabetes mellitus    Hepatitis C    Past Surgical History:  Procedure Laterality Date   CIRCUMCISION     30 years ago   COLONOSCOPY N/A 01/24/2016   Procedure: COLONOSCOPY;  Surgeon: Rogene Houston, MD;  [facility];  Service: Endoscopy;  Laterality: N/A;  1130    FAMILY HISTORY No family history on file.  SOCIAL HISTORY Social History   Tobacco Use   Smoking status: Some Days   Smokeless tobacco: Never   Tobacco comments:    1 pack every couple of weeks x 10 year.  Substance Use Topics   Alcohol use: Yes    Alcohol/week: 0.0 standard drinks    Comment:  occassionally  does not drink everyday. Occasional beer.   Drug use: Not Currently    Comment: cocaine       OPHTHALMIC EXAM: Not recorded    IMAGING AND PROCEDURES  Imaging and Procedures for 07/01/2021           ASSESSMENT/PLAN:    ICD-10-CM   1. Proliferative diabetic retinopathy of both eyes with macular edema associated with type 2 diabetes mellitus ([place])  J24.2683     2. Retinal edema  H35.81     3. Essential hypertension  I10     4. Hypertensive retinopathy of both eyes  H35.033     5. Pseudophakia, both eyes  Z96.1       1,2. Proliferative diabetic retinopathy, both eyes - s/p IVA OD #1 (09.14.22), #2 (10.18.22) - s/p PRP OS 11.04.22 - exam shows scattered MA, DBH, early NVE OU - FA (09.14.22) shows +focal NVE OU, late leaking MA's OU and vascular perfusion defect OU - OCT shows persistent IRF/DME OD  - recommend  - pt wishes to proceed - RBA of procedure discussed, questions answered - informed consent obtained and signed - see procedure note -- pt unable to tolerate laser with topical anesthesia - start PF QID OS x7 days **will need subconj block for PRP** - f/u Nov. 18 or later -- DFE/OCT, possible injection  3,4. Hypertensive retinopathy OU - discussed  importance of tight BP control - monitor  5. Pseudophakia OU  - s/p CE/IOL  - IOL in good position, doing well  - monitor  Ophthalmic Meds Ordered this visit:  No orders of the defined types were placed in this encounter.    No follow-ups on file.  There are no Patient Instructions on file for this visit.   Explained the diagnoses, plan, and follow up with the patient and they expressed understanding.  Patient expressed understanding of the importance of proper follow up care.   This document serves as a record of services personally performed by Gardiner Sleeper, MD, PhD. It was created on their behalf by Roselee Nova, COMT. The creation of this record is the provider's dictation and/or  activities during the visit.  Electronically signed by: Roselee Nova, COMT 06/27/21 1:44 PM    Gardiner Sleeper, M.D., Ph.D. Diseases & Surgery of the Retina and [place] 05/24/2021     Abbreviations: M myopia (nearsighted); A astigmatism; H hyperopia (farsighted); P presbyopia; Mrx spectacle prescription;  CTL contact lenses; OD right eye; OS left eye; OU both eyes  XT exotropia; ET esotropia; PEK punctate epithelial keratitis; PEE punctate epithelial erosions; DES dry eye syndrome; MGD meibomian gland dysfunction; ATs artificial tears; PFAT's preservative free artificial tears; [place] nuclear sclerotic cataract; PSC posterior subcapsular cataract; ERM epi-retinal membrane; PVD posterior vitreous detachment; RD retinal detachment; DM diabetes mellitus; DR diabetic retinopathy; NPDR non-proliferative diabetic retinopathy; PDR proliferative diabetic retinopathy; CSME clinically significant macular edema; DME diabetic macular edema; dbh dot blot hemorrhages; CWS cotton wool spot; POAG primary open angle glaucoma; C/D cup-to-disc ratio; HVF humphrey visual field; GVF goldmann visual field; OCT optical coherence tomography; IOP intraocular pressure; BRVO Branch retinal vein occlusion; CRVO central retinal vein occlusion; CRAO central retinal artery occlusion; BRAO branch retinal artery occlusion; RT retinal tear; SB scleral buckle; PPV pars plana vitrectomy; VH Vitreous hemorrhage; PRP panretinal laser photocoagulation; IVK intravitreal kenalog; VMT vitreomacular traction; MH Macular hole;  NVD neovascularization of the disc; NVE neovascularization elsewhere; AREDS age related eye disease study; ARMD age related macular degeneration; POAG primary open angle glaucoma; EBMD epithelial/anterior basement membrane dystrophy; ACIOL anterior chamber intraocular lens; IOL intraocular lens; PCIOL posterior chamber intraocular lens; Phaco/IOL phacoemulsification with intraocular lens  placement; [place] photorefractive keratectomy; LASIK laser assisted in situ keratomileusis; HTN hypertension; DM diabetes mellitus; COPD chronic obstructive pulmonary disease

## 2021-07-01 ENCOUNTER — Encounter (INDEPENDENT_AMBULATORY_CARE_PROVIDER_SITE_OTHER): Payer: Medicare HMO | Admitting: Ophthalmology

## 2021-07-01 DIAGNOSIS — Z961 Presence of intraocular lens: Secondary | ICD-10-CM

## 2021-07-01 DIAGNOSIS — E113513 Type 2 diabetes mellitus with proliferative diabetic retinopathy with macular edema, bilateral: Secondary | ICD-10-CM

## 2021-07-01 DIAGNOSIS — H3581 Retinal edema: Secondary | ICD-10-CM

## 2021-07-01 DIAGNOSIS — H35033 Hypertensive retinopathy, bilateral: Secondary | ICD-10-CM

## 2021-07-01 DIAGNOSIS — I10 Essential (primary) hypertension: Secondary | ICD-10-CM

## 2021-07-05 NOTE — Progress Notes (Shared)
[facility] Note  07/08/2021     CHIEF COMPLAINT Patient presents for No chief complaint on file.    HISTORY OF PRESENT ILLNESS: Jose Clark is a 70 y.o. male who presents to the clinic today for:    Pt presents for PRP OS today   Referring physician: Celene Squibb, MD [place],  [place] [address]  HISTORICAL INFORMATION:   Selected notes from the MEDICAL RECORD NUMBER Referred by Dr. Kathlen Mody for DME   CURRENT MEDICATIONS: No current outpatient medications on file. (Ophthalmic Drugs)   No current facility-administered medications for this visit. (Ophthalmic Drugs)   Current Outpatient Medications (Other)  Medication Sig   insulin NPH-insulin regular (NOVOLIN 70/30) (70-30) 100 UNIT/ML injection Inject 20-25 Units into the skin 2 (two) times daily with a meal.   JARDIANCE 25 MG TABS tablet    LANTUS SOLOSTAR 100 UNIT/ML Solostar Pen SMARTSIG:40 Unit(s) SUB-Q Daily   lisinopril (ZESTRIL) 10 MG tablet Take 10 mg by mouth daily.   RYBELSUS 7 MG TABS Take 1 tablet by mouth daily. (Patient not taking: Reported on 04/03/2021)   No current facility-administered medications for this visit. (Other)   REVIEW OF SYSTEMS:   ALLERGIES No Known Allergies  PAST MEDICAL HISTORY Past Medical History:  Diagnosis Date   Diabetes ([place])    x 20 yrs   Diabetes mellitus    Hepatitis C    Past Surgical History:  Procedure Laterality Date   CIRCUMCISION     30 years ago   COLONOSCOPY N/A 01/24/2016   Procedure: COLONOSCOPY;  Surgeon: Rogene Houston, MD;  [facility];  Service: Endoscopy;  Laterality: N/A;  1130    FAMILY HISTORY No family history on file.  SOCIAL HISTORY Social History   Tobacco Use   Smoking status: Some Days   Smokeless tobacco: Never   Tobacco comments:    1 pack every couple of weeks x 10 year.  Substance Use Topics   Alcohol use: Yes    Alcohol/week: 0.0 standard drinks    Comment:  occassionally  does not drink everyday. Occasional beer.   Drug use: Not Currently    Comment: cocaine       OPHTHALMIC EXAM: Not recorded    IMAGING AND PROCEDURES  Imaging and Procedures for 07/08/2021           ASSESSMENT/PLAN:    ICD-10-CM   1. Proliferative diabetic retinopathy of both eyes with macular edema associated with type 2 diabetes mellitus ([place])  A12.8786     2. Essential hypertension  I10     3. Hypertensive retinopathy of both eyes  H35.033     4. Pseudophakia, both eyes  Z96.1       1. Proliferative diabetic retinopathy, both eyes - s/p IVA OD #1 (09.14.22), #2 (10.18.22) - exam shows scattered MA, DBH, early NVE OU - FA (09.14.22) shows +focal NVE OU, late leaking MA's OU and vascular perfusion defect OU - OCT shows persistent IRF/DME OD  - recommend PRP OS today, 11.04.22 - pt wishes to proceed - RBA of procedure discussed, questions answered - informed consent obtained and signed - see procedure note -- pt unable to tolerate laser with topical anesthesia - start PF QID OS x7 days **will need subconj block for PRP** - f/u Nov. 18 or later -- DFE/OCT, possible injection  2,3. Hypertensive retinopathy OU - discussed importance of tight BP control - monitor  4. Pseudophakia  OU  - s/p CE/IOL  - IOL in good position, doing well  - monitor  Ophthalmic Meds Ordered this visit:  No orders of the defined types were placed in this encounter.    No follow-ups on file.  There are no Patient Instructions on file for this visit.   Explained the diagnoses, plan, and follow up with the patient and they expressed understanding.  Patient expressed understanding of the importance of proper follow up care.   This document serves as a record of services personally performed by Gardiner Sleeper, MD, PhD. It was created on their behalf by Roselee Nova, COMT. The creation of this record is the provider's dictation and/or activities during the  visit.  Electronically signed by: Roselee Nova, COM[place] 07/05/21 10:35 AM    Gardiner Sleeper, M.D., Ph.D. Diseases & Surgery of the Retina and [place] 05/24/2021     Abbreviations: M myopia (nearsighted); A astigmatism; H hyperopia (farsighted); P presbyopia; Mrx spectacle prescription;  CTL contact lenses; OD right eye; OS left eye; OU both eyes  XT exotropia; ET esotropia; PEK punctate epithelial keratitis; PEE punctate epithelial erosions; DES dry eye syndrome; MGD meibomian gland dysfunction; ATs artificial tears; PFAT's preservative free artificial tears; [place] nuclear sclerotic cataract; PSC posterior subcapsular cataract; ERM epi-retinal membrane; PVD posterior vitreous detachment; RD retinal detachment; DM diabetes mellitus; DR diabetic retinopathy; NPDR non-proliferative diabetic retinopathy; PDR proliferative diabetic retinopathy; CSME clinically significant macular edema; DME diabetic macular edema; dbh dot blot hemorrhages; CWS cotton wool spot; POAG primary open angle glaucoma; C/D cup-to-disc ratio; HVF humphrey visual field; GVF goldmann visual field; OCT optical coherence tomography; IOP intraocular pressure; BRVO Branch retinal vein occlusion; CRVO central retinal vein occlusion; CRAO central retinal artery occlusion; BRAO branch retinal artery occlusion; RT retinal tear; SB scleral buckle; PPV pars plana vitrectomy; VH Vitreous hemorrhage; PRP panretinal laser photocoagulation; IVK intravitreal kenalog; VMT vitreomacular traction; MH Macular hole;  NVD neovascularization of the disc; NVE neovascularization elsewhere; AREDS age related eye disease study; ARMD age related macular degeneration; POAG primary open angle glaucoma; EBMD epithelial/anterior basement membrane dystrophy; ACIOL anterior chamber intraocular lens; IOL intraocular lens; PCIOL posterior chamber intraocular lens; Phaco/IOL phacoemulsification with intraocular lens placement; [place]  photorefractive keratectomy; LASIK laser assisted in situ keratomileusis; HTN hypertension; DM diabetes mellitus; COPD chronic obstructive pulmonary disease

## 2021-07-08 ENCOUNTER — Encounter (INDEPENDENT_AMBULATORY_CARE_PROVIDER_SITE_OTHER): Payer: Medicare HMO | Admitting: Ophthalmology

## 2021-07-08 DIAGNOSIS — I10 Essential (primary) hypertension: Secondary | ICD-10-CM

## 2021-07-08 DIAGNOSIS — H35033 Hypertensive retinopathy, bilateral: Secondary | ICD-10-CM

## 2021-07-08 DIAGNOSIS — Z961 Presence of intraocular lens: Secondary | ICD-10-CM

## 2021-07-08 DIAGNOSIS — E113513 Type 2 diabetes mellitus with proliferative diabetic retinopathy with macular edema, bilateral: Secondary | ICD-10-CM

## 2021-07-09 ENCOUNTER — Ambulatory Visit: Payer: Medicare HMO | Admitting: Podiatry

## 2021-07-23 NOTE — Progress Notes (Shared)
[facility] Note  07/24/2021     CHIEF COMPLAINT Patient presents for No chief complaint on file.    HISTORY OF PRESENT ILLNESS: Jose Clark is a 71 y.o. male who presents to the clinic today for:      Referring physician: Celene Squibb, MD [place],  [place] [address]  HISTORICAL INFORMATION:   Selected notes from the MEDICAL RECORD NUMBER Referred by Dr. Kathlen Mody for DME   CURRENT MEDICATIONS: No current outpatient medications on file. (Ophthalmic Drugs)   No current facility-administered medications for this visit. (Ophthalmic Drugs)   Current Outpatient Medications (Other)  Medication Sig   insulin NPH-insulin regular (NOVOLIN 70/30) (70-30) 100 UNIT/ML injection Inject 20-25 Units into the skin 2 (two) times daily with a meal.   JARDIANCE 25 MG TABS tablet    LANTUS SOLOSTAR 100 UNIT/ML Solostar Pen SMARTSIG:40 Unit(s) SUB-Q Daily   lisinopril (ZESTRIL) 10 MG tablet Take 10 mg by mouth daily.   RYBELSUS 7 MG TABS Take 1 tablet by mouth daily. (Patient not taking: Reported on 04/03/2021)   No current facility-administered medications for this visit. (Other)   REVIEW OF SYSTEMS:   ALLERGIES No Known Allergies  PAST MEDICAL HISTORY Past Medical History:  Diagnosis Date   Diabetes ([place])    x 20 yrs   Diabetes mellitus    Hepatitis C    Past Surgical History:  Procedure Laterality Date   CIRCUMCISION     30 years ago   COLONOSCOPY N/A 01/24/2016   Procedure: COLONOSCOPY;  Surgeon: Rogene Houston, MD;  [facility];  Service: Endoscopy;  Laterality: N/A;  1130    FAMILY HISTORY No family history on file.  SOCIAL HISTORY Social History   Tobacco Use   Smoking status: Some Days   Smokeless tobacco: Never   Tobacco comments:    1 pack every couple of weeks x 10 year.  Substance Use Topics   Alcohol use: Yes    Alcohol/week: 0.0 standard drinks    Comment: occassionally  does not drink  everyday. Occasional beer.   Drug use: Not Currently    Comment: cocaine       OPHTHALMIC EXAM: Not recorded    IMAGING AND PROCEDURES  Imaging and Procedures for 07/24/2021           ASSESSMENT/PLAN:  No diagnosis found.  1,2. Proliferative diabetic retinopathy, both eyes - s/p IVA OD #1 (09.14.22), #2 (10.18.22) - exam shows scattered MA, DBH, early NVE OU - FA (09.14.22) shows +focal NVE OU, late leaking MA's OU and vascular perfusion defect OU - OCT shows persistent IRF/DME OD  - recommend PRP OS today, 11.04.22 - pt wishes to proceed - RBA of procedure discussed, questions answered - informed consent obtained and signed - see procedure note -- pt unable to tolerate laser with topical anesthesia - start PF QID OS x7 days **will need subconj block for PRP** - f/u Nov. 18 or later -- DFE/OCT, possible injection  3,4. Hypertensive retinopathy OU - discussed importance of tight BP control - monitor   5. Pseudophakia OU  - s/p CE/IOL  - IOL in good position, doing well  - monitor   Ophthalmic Meds Ordered this visit:  No orders of the defined types were placed in this encounter.    No follow-ups on file.  There are no Patient Instructions on file for this visit.   Explained the diagnoses, plan, and follow up  with the patient and they expressed understanding.  Patient expressed understanding of the importance of proper follow up care.   This document serves as a record of services personally performed by Gardiner Sleeper, MD, PhD. It was created on their behalf by Orvan Falconer, an ophthalmic technician. The creation of this record is the provider's dictation and/or activities during the visit.    Electronically signed by: Orvan Falconer, OA, 07/23/21  12:37 PM   Gardiner Sleeper, M.D., Ph.D. Diseases & Surgery of the Retina and [place] 05/24/2021  I have reviewed the above documentation for accuracy and  completeness, and I agree with the above. Gardiner Sleeper, M.D., Ph.D. 05/24/21 12:37 PM   Abbreviations: M myopia (nearsighted); A astigmatism; H hyperopia (farsighted); P presbyopia; Mrx spectacle prescription;  CTL contact lenses; OD right eye; OS left eye; OU both eyes  XT exotropia; ET esotropia; PEK punctate epithelial keratitis; PEE punctate epithelial erosions; DES dry eye syndrome; MGD meibomian gland dysfunction; ATs artificial tears; PFAT's preservative free artificial tears; [place] nuclear sclerotic cataract; PSC posterior subcapsular cataract; ERM epi-retinal membrane; PVD posterior vitreous detachment; RD retinal detachment; DM diabetes mellitus; DR diabetic retinopathy; NPDR non-proliferative diabetic retinopathy; PDR proliferative diabetic retinopathy; CSME clinically significant macular edema; DME diabetic macular edema; dbh dot blot hemorrhages; CWS cotton wool spot; POAG primary open angle glaucoma; C/D cup-to-disc ratio; HVF humphrey visual field; GVF goldmann visual field; OCT optical coherence tomography; IOP intraocular pressure; BRVO Branch retinal vein occlusion; CRVO central retinal vein occlusion; CRAO central retinal artery occlusion; BRAO branch retinal artery occlusion; RT retinal tear; SB scleral buckle; PPV pars plana vitrectomy; VH Vitreous hemorrhage; PRP panretinal laser photocoagulation; IVK intravitreal kenalog; VMT vitreomacular traction; MH Macular hole;  NVD neovascularization of the disc; NVE neovascularization elsewhere; AREDS age related eye disease study; ARMD age related macular degeneration; POAG primary open angle glaucoma; EBMD epithelial/anterior basement membrane dystrophy; ACIOL anterior chamber intraocular lens; IOL intraocular lens; PCIOL posterior chamber intraocular lens; Phaco/IOL phacoemulsification with intraocular lens placement; [place] photorefractive keratectomy; LASIK laser assisted in situ keratomileusis; HTN hypertension; DM diabetes mellitus; COPD  chronic obstructive pulmonary disease

## 2021-07-24 ENCOUNTER — Encounter (INDEPENDENT_AMBULATORY_CARE_PROVIDER_SITE_OTHER): Payer: Medicare HMO | Admitting: Ophthalmology

## 2021-07-24 DIAGNOSIS — Z961 Presence of intraocular lens: Secondary | ICD-10-CM

## 2021-07-24 DIAGNOSIS — H35033 Hypertensive retinopathy, bilateral: Secondary | ICD-10-CM

## 2021-07-24 DIAGNOSIS — E113513 Type 2 diabetes mellitus with proliferative diabetic retinopathy with macular edema, bilateral: Secondary | ICD-10-CM

## 2021-08-03 ENCOUNTER — Inpatient Hospital Stay (HOSPITAL_COMMUNITY)
Admission: EM | Admit: 2021-08-03 | Discharge: 2021-08-06 | DRG: 246 | Disposition: A | Discharge status: Home / Self Care | Payer: Medicare HMO | Attending: Cardiovascular Disease | Admitting: Cardiovascular Disease

## 2021-08-03 ENCOUNTER — Encounter (HOSPITAL_COMMUNITY): Payer: Self-pay

## 2021-08-03 ENCOUNTER — Encounter (HOSPITAL_COMMUNITY)
Admission: EM | Disposition: A | Discharge status: Home / Self Care | Payer: Self-pay | Source: Home / Self Care | Attending: Cardiovascular Disease

## 2021-08-03 ENCOUNTER — Emergency Department (HOSPITAL_COMMUNITY): Payer: Medicare HMO

## 2021-08-03 ENCOUNTER — Other Ambulatory Visit: Payer: Self-pay

## 2021-08-03 DIAGNOSIS — I2582 Chronic total occlusion of coronary artery: Secondary | ICD-10-CM | POA: Diagnosis not present

## 2021-08-03 DIAGNOSIS — Z7984 Long term (current) use of oral hypoglycemic drugs: Secondary | ICD-10-CM | POA: Diagnosis not present

## 2021-08-03 DIAGNOSIS — I214 Non-ST elevation (NSTEMI) myocardial infarction: Secondary | ICD-10-CM

## 2021-08-03 DIAGNOSIS — Z818 Family history of other mental and behavioral disorders: Secondary | ICD-10-CM

## 2021-08-03 DIAGNOSIS — I2510 Atherosclerotic heart disease of native coronary artery without angina pectoris: Secondary | ICD-10-CM | POA: Diagnosis not present

## 2021-08-03 DIAGNOSIS — N1831 Chronic kidney disease, stage 3a: Secondary | ICD-10-CM | POA: Diagnosis present

## 2021-08-03 DIAGNOSIS — E11649 Type 2 diabetes mellitus with hypoglycemia without coma: Secondary | ICD-10-CM | POA: Diagnosis not present

## 2021-08-03 DIAGNOSIS — I2584 Coronary atherosclerosis due to calcified coronary lesion: Secondary | ICD-10-CM | POA: Diagnosis present

## 2021-08-03 DIAGNOSIS — Z20822 Contact with and (suspected) exposure to covid-19: Secondary | ICD-10-CM | POA: Diagnosis present

## 2021-08-03 DIAGNOSIS — I252 Old myocardial infarction: Secondary | ICD-10-CM

## 2021-08-03 DIAGNOSIS — I249 Acute ischemic heart disease, unspecified: Secondary | ICD-10-CM

## 2021-08-03 DIAGNOSIS — I213 ST elevation (STEMI) myocardial infarction of unspecified site: Secondary | ICD-10-CM | POA: Diagnosis not present

## 2021-08-03 DIAGNOSIS — I5021 Acute systolic (congestive) heart failure: Secondary | ICD-10-CM | POA: Diagnosis present

## 2021-08-03 DIAGNOSIS — Z7902 Long term (current) use of antithrombotics/antiplatelets: Secondary | ICD-10-CM

## 2021-08-03 DIAGNOSIS — E785 Hyperlipidemia, unspecified: Secondary | ICD-10-CM | POA: Diagnosis not present

## 2021-08-03 DIAGNOSIS — Z79899 Other long term (current) drug therapy: Secondary | ICD-10-CM

## 2021-08-03 DIAGNOSIS — I255 Ischemic cardiomyopathy: Secondary | ICD-10-CM | POA: Diagnosis not present

## 2021-08-03 DIAGNOSIS — Z823 Family history of stroke: Secondary | ICD-10-CM | POA: Diagnosis not present

## 2021-08-03 DIAGNOSIS — E1122 Type 2 diabetes mellitus with diabetic chronic kidney disease: Secondary | ICD-10-CM | POA: Diagnosis present

## 2021-08-03 DIAGNOSIS — Z794 Long term (current) use of insulin: Secondary | ICD-10-CM | POA: Diagnosis not present

## 2021-08-03 DIAGNOSIS — Z955 Presence of coronary angioplasty implant and graft: Secondary | ICD-10-CM

## 2021-08-03 DIAGNOSIS — I2102 ST elevation (STEMI) myocardial infarction involving left anterior descending coronary artery: Principal | ICD-10-CM | POA: Diagnosis present

## 2021-08-03 DIAGNOSIS — J90 Pleural effusion, not elsewhere classified: Secondary | ICD-10-CM | POA: Diagnosis not present

## 2021-08-03 DIAGNOSIS — E119 Type 2 diabetes mellitus without complications: Secondary | ICD-10-CM | POA: Diagnosis not present

## 2021-08-03 DIAGNOSIS — R079 Chest pain, unspecified: Secondary | ICD-10-CM | POA: Diagnosis not present

## 2021-08-03 DIAGNOSIS — R0689 Other abnormalities of breathing: Secondary | ICD-10-CM | POA: Diagnosis not present

## 2021-08-03 DIAGNOSIS — R0789 Other chest pain: Secondary | ICD-10-CM | POA: Diagnosis not present

## 2021-08-03 DIAGNOSIS — Z87891 Personal history of nicotine dependence: Secondary | ICD-10-CM

## 2021-08-03 DIAGNOSIS — R059 Cough, unspecified: Secondary | ICD-10-CM | POA: Diagnosis not present

## 2021-08-03 HISTORY — PX: LEFT HEART CATH AND CORONARY ANGIOGRAPHY: CATH118249

## 2021-08-03 HISTORY — PX: CORONARY/GRAFT ACUTE MI REVASCULARIZATION: CATH118305

## 2021-08-03 LAB — COMPREHENSIVE METABOLIC PANEL
ALT: 29 U/L (ref 0–44)
AST: 30 U/L (ref 15–41)
Albumin: 3.2 g/dL — ABNORMAL LOW (ref 3.5–5.0)
Alkaline Phosphatase: 72 U/L (ref 38–126)
Anion gap: 7 (ref 5–15)
BUN: 24 mg/dL — ABNORMAL HIGH (ref 8–23)
CO2: 20 mmol/L — ABNORMAL LOW (ref 22–32)
Calcium: 8.1 mg/dL — ABNORMAL LOW (ref 8.9–10.3)
Chloride: 108 mmol/L (ref 98–111)
Creatinine, Ser: 1.46 mg/dL — ABNORMAL HIGH (ref 0.61–1.24)
GFR, Estimated: 52 mL/min — ABNORMAL LOW (ref >60)
Glucose, Bld: 193 mg/dL — ABNORMAL HIGH (ref 70–99)
Potassium: 4.3 mmol/L (ref 3.5–5.1)
Sodium: 135 mmol/L (ref 135–145)
Total Bilirubin: 0.6 mg/dL (ref 0.3–1.2)
Total Protein: 6.3 g/dL — ABNORMAL LOW (ref 6.5–8.1)

## 2021-08-03 LAB — CBC WITH DIFFERENTIAL/PLATELET
Abs Immature Granulocytes: 0.01 10*3/uL (ref 0.00–0.07)
Basophils Absolute: 0 10*3/uL (ref 0.0–0.1)
Basophils Relative: 0 %
Eosinophils Absolute: 0 10*3/uL (ref 0.0–0.5)
Eosinophils Relative: 0 %
HCT: 43.8 % (ref 39.0–52.0)
Hemoglobin: 15.2 g/dL (ref 13.0–17.0)
Immature Granulocytes: 0 %
Lymphocytes Relative: 39 %
Lymphs Abs: 3.2 10*3/uL (ref 0.7–4.0)
MCH: 32.4 pg (ref 26.0–34.0)
MCHC: 34.7 g/dL (ref 30.0–36.0)
MCV: 93.4 fL (ref 80.0–100.0)
Monocytes Absolute: 1 10*3/uL (ref 0.1–1.0)
Monocytes Relative: 12 %
Neutro Abs: 3.9 10*3/uL (ref 1.7–7.7)
Neutrophils Relative %: 49 %
Platelets: 186 10*3/uL (ref 150–400)
RBC: 4.69 MIL/uL (ref 4.22–5.81)
RDW: 12.5 % (ref 11.5–15.5)
WBC: 8.1 10*3/uL (ref 4.0–10.5)
nRBC: 0 % (ref 0.0–0.2)

## 2021-08-03 LAB — TROPONIN I (HIGH SENSITIVITY)
Troponin I (High Sensitivity): 1098 ng/L (ref <18)
Troponin I (High Sensitivity): 958 ng/L (ref <18)

## 2021-08-03 LAB — GLUCOSE, CAPILLARY
Glucose-Capillary: 173 mg/dL — ABNORMAL HIGH (ref 70–99)
Glucose-Capillary: 225 mg/dL — ABNORMAL HIGH (ref 70–99)

## 2021-08-03 LAB — POCT ACTIVATED CLOTTING TIME: Activated Clotting Time: 383 seconds

## 2021-08-03 LAB — RESP PANEL BY RT-PCR (FLU A&B, COVID) ARPGX2
Influenza A by PCR: NEGATIVE
Influenza B by PCR: NEGATIVE
SARS Coronavirus 2 by RT PCR: NEGATIVE

## 2021-08-03 LAB — BRAIN NATRIURETIC PEPTIDE: B Natriuretic Peptide: 703 pg/mL — ABNORMAL HIGH (ref 0.0–100.0)

## 2021-08-03 LAB — D-DIMER, QUANTITATIVE: D-Dimer, Quant: 0.63 ug/mL-FEU — ABNORMAL HIGH (ref 0.00–0.50)

## 2021-08-03 SURGERY — CORONARY/GRAFT ACUTE MI REVASCULARIZATION
Anesthesia: LOCAL

## 2021-08-03 MED ORDER — ALBUTEROL SULFATE HFA 108 (90 BASE) MCG/ACT IN AERS
2.0000 | INHALATION_SPRAY | Freq: Once | RESPIRATORY_TRACT | Status: AC
  Administered 2021-08-03: 2 via RESPIRATORY_TRACT
  Filled 2021-08-03: qty 6.7

## 2021-08-03 MED ORDER — HEPARIN (PORCINE) 25000 UT/250ML-% IV SOLN
1000.0000 [IU]/h | INTRAVENOUS | Status: DC
  Administered 2021-08-03: 1000 [IU]/h via INTRAVENOUS
  Filled 2021-08-03: qty 250

## 2021-08-03 MED ORDER — LIDOCAINE HCL (PF) 1 % IJ SOLN
INTRAMUSCULAR | Status: DC | PRN
  Administered 2021-08-03: 2 mL

## 2021-08-03 MED ORDER — ONDANSETRON HCL 4 MG/2ML IJ SOLN: 4.0000 mg | Freq: Four times a day (QID) | INTRAMUSCULAR | Status: DC | PRN

## 2021-08-03 MED ORDER — SODIUM CHLORIDE 0.9 % IV SOLN: 250.0000 mL | INTRAVENOUS | Status: DC | PRN

## 2021-08-03 MED ORDER — TICAGRELOR 90 MG PO TABS
90.0000 mg | ORAL_TABLET | Freq: Two times a day (BID) | ORAL | Status: DC
  Administered 2021-08-03 – 2021-08-06 (×6): 90 mg via ORAL
  Filled 2021-08-03 (×6): qty 1

## 2021-08-03 MED ORDER — FENTANYL CITRATE (PF) 100 MCG/2ML IJ SOLN
INTRAMUSCULAR | Status: DC | PRN
  Administered 2021-08-03: 25 ug via INTRAVENOUS

## 2021-08-03 MED ORDER — HEPARIN SODIUM (PORCINE) 1000 UNIT/ML IJ SOLN
INTRAMUSCULAR | Status: DC | PRN
  Administered 2021-08-03 (×2): 5000 [IU] via INTRAVENOUS

## 2021-08-03 MED ORDER — ACETAMINOPHEN 325 MG PO TABS: 650.0000 mg | ORAL_TABLET | ORAL | Status: DC | PRN

## 2021-08-03 MED ORDER — HEPARIN BOLUS VIA INFUSION
4000.0000 [IU] | Freq: Once | INTRAVENOUS | Status: AC
  Administered 2021-08-03: 4000 [IU] via INTRAVENOUS

## 2021-08-03 MED ORDER — INSULIN ASPART 100 UNIT/ML IJ SOLN
0.0000 [IU] | Freq: Three times a day (TID) | INTRAMUSCULAR | Status: DC
  Administered 2021-08-03: 3 [IU] via SUBCUTANEOUS
  Administered 2021-08-04: 2 [IU] via SUBCUTANEOUS
  Filled 2021-08-03: qty 0.15

## 2021-08-03 MED ORDER — ASPIRIN 81 MG PO CHEW
81.0000 mg | CHEWABLE_TABLET | Freq: Every day | ORAL | Status: DC
  Administered 2021-08-04 – 2021-08-06 (×3): 81 mg via ORAL
  Filled 2021-08-03 (×3): qty 1

## 2021-08-03 MED ORDER — LABETALOL HCL 5 MG/ML IV SOLN: 10.0000 mg | INTRAVENOUS | Status: AC | PRN

## 2021-08-03 MED ORDER — HYDRALAZINE HCL 20 MG/ML IJ SOLN: 10.0000 mg | INTRAMUSCULAR | Status: AC | PRN

## 2021-08-03 MED ORDER — HEPARIN SODIUM (PORCINE) 1000 UNIT/ML IJ SOLN
INTRAMUSCULAR | Status: AC
  Filled 2021-08-03: qty 10

## 2021-08-03 MED ORDER — FENTANYL CITRATE (PF) 100 MCG/2ML IJ SOLN
INTRAMUSCULAR | Status: AC
  Filled 2021-08-03: qty 2

## 2021-08-03 MED ORDER — OXYCODONE HCL 5 MG PO TABS
5.0000 mg | ORAL_TABLET | ORAL | Status: DC | PRN
  Administered 2021-08-04 – 2021-08-05 (×3): 5 mg via ORAL
  Filled 2021-08-03 (×4): qty 1

## 2021-08-03 MED ORDER — EMPAGLIFLOZIN 25 MG PO TABS
25.0000 mg | ORAL_TABLET | Freq: Every day | ORAL | Status: DC
  Administered 2021-08-04 – 2021-08-06 (×3): 25 mg via ORAL
  Filled 2021-08-03 (×3): qty 1

## 2021-08-03 MED ORDER — SODIUM CHLORIDE 0.9% FLUSH
3.0000 mL | Freq: Two times a day (BID) | INTRAVENOUS | Status: DC
  Administered 2021-08-03 – 2021-08-06 (×6): 3 mL via INTRAVENOUS

## 2021-08-03 MED ORDER — SODIUM CHLORIDE 0.9 % IV SOLN: INTRAVENOUS | Status: AC

## 2021-08-03 MED ORDER — INSULIN ASPART 100 UNIT/ML IJ SOLN
3.0000 [IU] | Freq: Three times a day (TID) | INTRAMUSCULAR | Status: DC
  Administered 2021-08-03 – 2021-08-04 (×4): 3 [IU] via SUBCUTANEOUS
  Filled 2021-08-03: qty 0.03

## 2021-08-03 MED ORDER — NITROGLYCERIN 1 MG/10 ML FOR IR/CATH LAB
INTRA_ARTERIAL | Status: AC
  Filled 2021-08-03: qty 10

## 2021-08-03 MED ORDER — HEPARIN (PORCINE) IN NACL 1000-0.9 UT/500ML-% IV SOLN
INTRAVENOUS | Status: DC | PRN
  Administered 2021-08-03 (×3): 500 mL

## 2021-08-03 MED ORDER — LIDOCAINE HCL (PF) 1 % IJ SOLN
INTRAMUSCULAR | Status: AC
  Filled 2021-08-03: qty 30

## 2021-08-03 MED ORDER — VERAPAMIL HCL 2.5 MG/ML IV SOLN
INTRAVENOUS | Status: AC
  Filled 2021-08-03: qty 2

## 2021-08-03 MED ORDER — ASPIRIN 81 MG PO CHEW
324.0000 mg | CHEWABLE_TABLET | Freq: Once | ORAL | Status: AC
Start: 2021-08-03 — End: 2021-08-03
  Administered 2021-08-03: 324 mg via ORAL
  Filled 2021-08-03: qty 4

## 2021-08-03 MED ORDER — SODIUM CHLORIDE 0.9% FLUSH: 3.0000 mL | INTRAVENOUS | Status: DC | PRN

## 2021-08-03 MED ORDER — ATORVASTATIN CALCIUM 80 MG PO TABS
80.0000 mg | ORAL_TABLET | Freq: Every day | ORAL | Status: DC
  Administered 2021-08-03 – 2021-08-06 (×4): 80 mg via ORAL
  Filled 2021-08-03 (×4): qty 1

## 2021-08-03 MED ORDER — TICAGRELOR 90 MG PO TABS
ORAL_TABLET | ORAL | Status: AC
  Filled 2021-08-03: qty 2

## 2021-08-03 MED ORDER — SODIUM CHLORIDE 0.9 % IV BOLUS
1000.0000 mL | Freq: Once | INTRAVENOUS | Status: AC
  Administered 2021-08-03: 1000 mL via INTRAVENOUS

## 2021-08-03 MED ORDER — VERAPAMIL HCL 2.5 MG/ML IV SOLN
INTRAVENOUS | Status: DC | PRN
  Administered 2021-08-03: 10 mL via INTRA_ARTERIAL

## 2021-08-03 MED ORDER — IOHEXOL 350 MG/ML SOLN
INTRAVENOUS | Status: DC | PRN
  Administered 2021-08-03: 160 mL

## 2021-08-03 MED ORDER — HEPARIN (PORCINE) IN NACL 1000-0.9 UT/500ML-% IV SOLN
INTRAVENOUS | Status: AC
  Filled 2021-08-03: qty 1000

## 2021-08-03 MED ORDER — TICAGRELOR 90 MG PO TABS
ORAL_TABLET | ORAL | Status: DC | PRN
  Administered 2021-08-03: 180 mg via ORAL

## 2021-08-03 MED ORDER — FUROSEMIDE 10 MG/ML IJ SOLN
40.0000 mg | Freq: Every day | INTRAMUSCULAR | Status: DC
  Administered 2021-08-03 – 2021-08-04 (×2): 40 mg via INTRAVENOUS
  Filled 2021-08-03 (×2): qty 4

## 2021-08-03 MED ORDER — MIDAZOLAM HCL 2 MG/2ML IJ SOLN
INTRAMUSCULAR | Status: AC
  Filled 2021-08-03: qty 2

## 2021-08-03 MED ORDER — INSULIN GLARGINE-YFGN 100 UNIT/ML ~~LOC~~ SOLN
40.0000 [IU] | Freq: Every day | SUBCUTANEOUS | Status: DC
  Administered 2021-08-03 – 2021-08-04 (×2): 40 [IU] via SUBCUTANEOUS
  Filled 2021-08-03 (×4): qty 0.4

## 2021-08-03 MED ORDER — MIDAZOLAM HCL 2 MG/2ML IJ SOLN
INTRAMUSCULAR | Status: DC | PRN
  Administered 2021-08-03: 1 mg via INTRAVENOUS

## 2021-08-03 SURGICAL SUPPLY — 21 items
BALLN SAPPHIRE 2.0X12 (BALLOONS) ×2
BALLN SAPPHIRE ~~LOC~~ 3.0X12 (BALLOONS) ×2 IMPLANT
BALLN WOLVERINE 2.75X10 (BALLOONS) ×2
BALLOON SAPPHIRE 2.0X12 (BALLOONS) IMPLANT
BALLOON WOLVERINE 2.75X10 (BALLOONS) IMPLANT
CATH 5FR JL3.5 JR4 ANG PIG MP (CATHETERS) ×2 IMPLANT
CATH VISTA GUIDE 6FR XBLAD3.5 (CATHETERS) ×2 IMPLANT
DEVICE RAD COMP TR BAND LRG (VASCULAR PRODUCTS) ×2 IMPLANT
ELECT DEFIB PAD ADLT CADENCE (PAD) ×2 IMPLANT
GLIDESHEATH SLEND SS 6F .021 (SHEATH) ×2 IMPLANT
GUIDEWIRE INQWIRE 1.5J.035X260 (WIRE) IMPLANT
INQWIRE 1.5J .035X260CM (WIRE) ×2
KIT ENCORE 26 ADVANTAGE (KITS) ×2 IMPLANT
KIT HEART LEFT (KITS) ×3 IMPLANT
PACK CARDIAC CATHETERIZATION (CUSTOM PROCEDURE TRAY) ×3 IMPLANT
SHEATH PROBE COVER 6X72 (BAG) ×2 IMPLANT
STENT SYNERGY XD 2.75X16 (Permanent Stent) IMPLANT
SYNERGY XD 2.75X16 (Permanent Stent) ×2 IMPLANT
TRANSDUCER W/STOPCOCK (MISCELLANEOUS) ×3 IMPLANT
TUBING CIL FLEX 10 FLL-RA (TUBING) ×3 IMPLANT
WIRE COUGAR XT STRL 190CM (WIRE) ×2 IMPLANT

## 2021-08-03 NOTE — Progress Notes (Addendum)
ANTICOAGULATION CONSULT NOTE - Initial Consult  Pharmacy Consult for heparin Indication: chest pain/ACS  No Known Allergies  Patient Measurements: Height: 5\' 11"  (180.3 cm) Weight: 81.6 kg (180 lb) IBW/kg (Calculated) : 75.3 Heparin Dosing Weight: 81.6 kg  Vital Signs: Temp: 98.1 F (36.7 C) (01/14 1049) Temp Source: Oral (01/14 1049) BP: 115/87 (01/14 1130) Pulse Rate: 99 (01/14 1130)  Labs: Recent Labs    08/03/21 1105  HGB 15.2  HCT 43.8  PLT 186  CREATININE 1.46*  TROPONINIHS 1,098*    Estimated Creatinine Clearance: 50.9 mL/min (A) (by C-G formula based on SCr of 1.46 mg/dL (H)).   Medical History: Past Medical History:  Diagnosis Date   Diabetes ([place])    x 20 yrs   Diabetes mellitus    Hepatitis C     Medications:  (Not in a hospital admission)   Assessment: Pharmacy consulted to dose heparin in patient with chest pain/ACS.  Patient is not on anticoagulation prior to admission.  Trop 1,098 CBC WNL  Goal of Therapy:  Heparin level 0.3-0.7 units/ml Monitor platelets by anticoagulation protocol: Yes   Plan:  Give 4000 units bolus x 1 Start heparin infusion at 1000 units/hr Check anti-Xa level in 6-8 hours and daily while on heparin Continue to monitor H&H and platelets  Margot Ables, PharmD Clinical Pharmacist 08/03/2021 11:55 AM

## 2021-08-03 NOTE — ED Notes (Signed)
Pt hit call bell c/o worsening chest pain. EKG ran and given to MD. MD went to bedside to evaluate pt. No new orders at this time.

## 2021-08-03 NOTE — ED Triage Notes (Signed)
Pt presents today c/o SHOB and cough since last night, reports body aches x 2 days. Denies hx of breathing issues. Denies fevers

## 2021-08-03 NOTE — ED Provider Notes (Signed)
[facility] EMERGENCY DEPARTMENT Provider Note   CSN: [id number] Arrival date & time: 08/03/21  1040     History  Chief Complaint  Patient presents with   Shortness of Breath    Jose Clark is a 71 y.o. male.  HPI 71 year old male with a history of diabetes on insulin as well as chronic smoking history presents with cough and shortness of breath.  Symptoms started last night.  First symptom was chest pain that he describes as feeling like a reflux for about 3 hours.  That resolved but then he has been short of breath ever since.  He had a cough with some white sputum.  He has not had a fever but feels like he is heard some wheezing.  No leg swelling or abdominal symptoms or vomiting.  Very short of breath on exertion.  He denies any known lung history.  Home Medications Prior to Admission medications   Medication Sig Start Date End Date Taking? Authorizing Provider  insulin NPH-insulin regular (NOVOLIN 70/30) (70-30) 100 UNIT/ML injection Inject 20-25 Units into the skin 2 (two) times daily with a meal.    [provider]  JARDIANCE 25 MG TABS tablet  12/03/20   [provider]  LANTUS SOLOSTAR 100 UNIT/ML Solostar Pen SMARTSIG:40 Unit(s) SUB-Q Daily 03/07/21   [provider]  lisinopril (ZESTRIL) 10 MG tablet Take 10 mg by mouth daily.    [provider]  RYBELSUS 7 MG TABS Take 1 tablet by mouth daily. Patient not taking: Reported on 04/03/2021 03/07/21   [provider]      Allergies    Patient has no known allergies.    Review of Systems   Review of Systems  Constitutional:  Negative for fever.  Respiratory:  Positive for cough, shortness of breath and wheezing.   Cardiovascular:  Positive for chest pain. Negative for leg swelling.  Gastrointestinal:  Negative for abdominal pain and vomiting.   Physical Exam Updated Vital Signs BP 115/87    Pulse 99    Temp 98.1 F (36.7 C) (Oral)    Resp (!) 24    Ht 5\' 11"  (1.803 m)     Wt 81.6 kg    SpO2 97%    BMI 25.10 kg/m  Physical Exam Vitals and nursing note reviewed.  Constitutional:      Appearance: He is well-developed. He is not ill-appearing or diaphoretic.  HENT:     Head: Normocephalic and atraumatic.  Cardiovascular:     Rate and Rhythm: Regular rhythm. Tachycardia present.     Heart sounds: Normal heart sounds.  Pulmonary:     Effort: Tachypnea present. No accessory muscle usage or respiratory distress.     Comments: Coarse breath sounds bilaterally Abdominal:     Palpations: Abdomen is soft.     Tenderness: There is no abdominal tenderness.  Musculoskeletal:     Right lower leg: No edema.     Left lower leg: No edema.  Skin:    General: Skin is warm and dry.  Neurological:     Mental Status: He is alert.    ED Results / Procedures / Treatments   Labs (all labs ordered are listed, but only abnormal results are displayed) Labs Reviewed  COMPREHENSIVE METABOLIC PANEL - Abnormal; Notable for the following components:      Result Value   CO2 20 (*)    Glucose, Bld 193 (*)    BUN 24 (*)    Creatinine, Ser 1.46 (*)  Calcium 8.1 (*)    Total Protein 6.3 (*)    Albumin 3.2 (*)    GFR, Estimated 52 (*)    All other components within normal limits  BRAIN NATRIURETIC PEPTIDE - Abnormal; Notable for the following components:   B Natriuretic Peptide 703.0 (*)    All other components within normal limits  TROPONIN I (HIGH SENSITIVITY) - Abnormal; Notable for the following components:   Troponin I (High Sensitivity) 1,098 (*)    All other components within normal limits  RESP PANEL BY RT-PCR (FLU A&B, COVID) ARPGX2  CBC WITH DIFFERENTIAL/PLATELET  D-DIMER, QUANTITATIVE  HEPARIN LEVEL (UNFRACTIONATED)    EKG EKG Interpretation  Date/Time:  Saturday August 03 2021 11:40:14 EST Ventricular Rate:  105 PR Interval:  156 QRS Duration: 81 QT Interval:  310 QTC Calculation: 410 R Axis:   15 Text Interpretation: Sinus tachycardia Probable  left atrial enlargement Borderline low voltage, extremity leads Nonspecific repol abnormality, lateral leads  overall similar to earlier in the day Confirmed by Sherwood Gambler [id number]) on 08/03/2021 11:46:16 AM  Radiology DG Chest 2 View  Result Date: 08/03/2021 CLINICAL DATA:  Cough and body aches EXAM: CHEST - 2 VIEW COMPARISON:  08/21/2020 FINDINGS: Fine interstitial opacities bilaterally. Normal heart size and mediastinal contours. Trace effusions. IMPRESSION: Kerley lines usually reflecting interstitial edema. In the setting of body aches atypical infection is also considered. Electronically Signed   By: Jorje Guild M.D.   On: 08/03/2021 11:40    Procedures .Critical Care Performed by: Sherwood Gambler, MD Authorized by: Sherwood Gambler, MD   Critical care provider statement:    Critical care time (minutes):  45   Critical care time was exclusive of:  Separately billable procedures and treating other patients   Critical care was necessary to treat or prevent imminent or life-threatening deterioration of the following conditions:  Cardiac failure and circulatory failure   Critical care was time spent personally by me on the following activities:  Development of treatment plan with patient or surrogate, discussions with consultants, evaluation of patient's response to treatment, examination of patient, ordering and review of laboratory studies, ordering and review of radiographic studies, ordering and performing treatments and interventions, pulse oximetry, re-evaluation of patient's condition and review of old charts    Medications Ordered in ED Medications  heparin bolus via infusion 4,000 Units (has no administration in time range)  heparin ADULT infusion 100 units/mL (25000 units/213mL) (has no administration in time range)  sodium chloride 0.9 % bolus 1,000 mL (0 mLs Intravenous Stopped 08/03/21 1143)  albuterol (VENTOLIN HFA) 108 (90 Base) MCG/ACT inhaler 2 puff (2 puffs Inhalation  Given 08/03/21 1112)  aspirin chewable tablet 324 mg (324 mg Oral Given 08/03/21 1152)    ED Course/ Medical Decision Making/ A&P                           Medical Decision Making  Patient appears to have acute MI.  On initial presentation it seemed like he had some chest pain last night but now is presenting with more respiratory symptoms.  After evaluation, I think this is more that he is having some CHF from a delayed presentation of ACS.  His ECGs have been reviewed and interpreted by myself and the initial ECGs shows some nonspecific ST changes but no significant ST elevation that would meet STEMI criteria.  However he does have abnormal anterior ST waves.  He was initially started on some fluids but  then stopped as below.  I have reviewed and interpreted his labs which shows stable renal disease but an elevated BNP consistent with some CHF as well as a troponin of 1000 that is consistent with acute coronary ischemia.  No leukocytosis.  Of note, his D-dimer is negative from an age-adjusted standpoint.  He has no previous cardiac history and no previous cardiac work-up according to chart review.  12:03 PM I discussed with Dr. Percival Spanish of cardiology.  We have reviewed the case and reviewed the ECGs and work-up so far.  At this point, there is concern for NSTEMI so he will be placed on heparin and aspirin.  His chest pain came back just prior to his most recent ECG but is now dying off and he feels better.  ECG is not consistent with STEMI.  He is not hypoxic.  A liter of fluid had been ordered previously though he only got 200 cc and this was stopped.  After discussion with cards we will hold off on Lasix and also no further fluids.  We discussed the possibility of PE but for now this is seeming like a more cardiac cause and so we will hold off on CTA of the chest.  This does not seem consistent with dissection.  He will be admitted to the cardiology service.  We are currently planning on getting a  repeat EKG in about 20 minutes to make sure there is no progression.  12:09 PM on re-eval patient is pain free.   12:34 PM Patient is pain free. However his repeat EKG shows biphasic V3 and ST elevation. D/w Dr. Percival Spanish, will activate code STEMI.  Patient will be transported emergently by [facility] for speedy as transport to the catheterization lab at [facility].  He remains pain-free and is hemodynamically stable for transfer.        Final Clinical Impression(s) / ED Diagnoses Final diagnoses:  Acute coronary syndrome [facility])    Rx / DC Orders ED Discharge Orders     None         Sherwood Gambler, MD 08/03/21 1310

## 2021-08-03 NOTE — ED Triage Notes (Signed)
Trop 1098  CRITICAL VALUE ALERT  Critical value received:  Trop:1098  Date of notification:  08/03/2021   Time of notification:  6759  Critical value read back:Yes.    Nurse who received alert:  M.Romilda Garret    MD notified: Dr Regenia Skeeter @ [phone]

## 2021-08-03 NOTE — ED Notes (Signed)
EMS at bedside, transporting pt to [facility] Cath lab

## 2021-08-03 NOTE — H&P (Signed)
Cardiology Admission History and Physical:   Patient ID: Jose Clark MRN: [id number]; DOB: [birth year]   Admission date: 08/03/2021  PCP:  Celene Squibb, MD   [facility] Providers Cardiologist:  None     Chief Complaint:  Chest pain, dypnea  Patient Profile:   Jose Clark is a 71 y.o. male with history of tobacco abuse and DM who is transferred to [facility] from [facility] for cardiac cath. The patient was seen in the ED this morning with c/o dyspnea. Chest pain episode last night and recurrent pain this am. First troponin 1098. BNP elevated. Chest x-ray with evidence of CHF. EKG with progression of subtle anterior ST elevation in leads V2 and V3 with T wave inversion and ST depression in leads V4, V5. Code STEMI called by Dr. Percival Spanish and Dr. Regenia Skeeter. Pt started on a heparin drip and transferred to [facility] for emergent cardiac cath.   History of Present Illness:    Jose Clark is a 71 y.o. male with history of tobacco abuse and DM who is transferred to [facility] from [facility] for cardiac cath. The patient was seen in the ED this morning with c/o dyspnea. Chest pain episode last night and recurrent pain this am. First troponin 1098. BNP elevated. Chest x-ray with evidence of CHF. EKG with progression of subtle anterior ST elevation in leads V2 and V3 with T wave inversion and ST depression in leads V4, V5. Code STEMI called by Dr. Percival Spanish and Dr. Regenia Skeeter. Pt started on a heparin drip and transferred to [facility] for emergent cardiac cath. D-dimer elevation not felt to be significant by ED staff. He tells me that he has been having chest pain on/off all week but thought it was heartburn.    Past Medical History:  Diagnosis Date   Diabetes ([place])    x 20 yrs   Diabetes mellitus    Hepatitis C     Past Surgical History:  Procedure Laterality Date   CIRCUMCISION     30 years ago   COLONOSCOPY N/A 01/24/2016   Procedure: COLONOSCOPY;  Surgeon: Rogene Houston, MD;   Location: [facility]O [facility]ITE;  Service: Endoscopy;  Laterality: N/A;  1130     Medications Prior to Admission: Prior to Admission medications   Medication Sig Start Date End Date Taking? Authorizing Provider  insulin NPH-insulin regular (NOVOLIN 70/30) (70-30) 100 UNIT/ML injection Inject 20-25 Units into the skin 2 (two) times daily with a meal.    [provider]  JARDIANCE 25 MG TABS tablet  12/03/20   [provider]  LANTUS SOLOSTAR 100 UNIT/ML Solostar Pen Inject 40 Units into the skin daily. 03/07/21   [provider]  lisinopril (ZESTRIL) 10 MG tablet Take 10 mg by mouth daily.    [provider]  RYBELSUS 7 MG TABS Take 1 tablet by mouth daily. Patient not taking: Reported on 04/03/2021 03/07/21   [provider]     Allergies:   No Known Allergies  Social History:   Social History   Socioeconomic History   Marital status: Single    Spouse name: Not on file   Number of children: Not on file   Years of education: Not on file   Highest education level: Not on file  Occupational History   Not on file  Tobacco Use   Smoking status: Some Days   Smokeless tobacco: Never   Tobacco comments:    1 pack every couple of weeks x  10 year.  Substance and Sexual Activity   Alcohol use: Yes    Alcohol/week: 0.0 standard drinks    Comment: occassionally  does not drink everyday. Occasional beer.   Drug use: Not Currently    Comment: cocaine   Sexual activity: Not on file  Other Topics Concern   Not on file  Social History Narrative   Not on file   Social Determinants of Health   Financial Resource Strain: Not on file  Food Insecurity: Not on file  Transportation Needs: Not on file  Physical Activity: Not on file  Stress: Not on file  Social Connections: Not on file  Intimate Partner Violence: Not on file    Family History:   The patient's family history includes CVA in his father; Dementia in his mother.    ROS:  Please see the  history of present illness.  All other ROS reviewed and negative.     Physical Exam/Data:   Vitals:   08/03/21 1130 08/03/21 1200 08/03/21 1215 08/03/21 1230  BP: 115/87 111/76 123/90 (!) 120/91  Pulse: 99 98 94 91  Resp: (!) 24 (!) 34 (!) 28 (!) 28  Temp:      TempSrc:      SpO2: 97% 95% 96% 96%  Weight:      Height:       No intake or output data in the 24 hours ending 08/03/21 1338 Last 3 Weights 08/03/2021 08/21/2020 08/19/2018  Weight (lbs) 180 lb 180 lb 180 lb  Weight (kg) 81.647 kg 81.647 kg 81.647 kg     Body mass index is 25.1 kg/m.  General:  Well nourished, well developed, in no acute distress HEENT: normal Neck: + JVD Vascular: No carotid bruits; Distal pulses 2+ bilaterally   Cardiac:  normal S1, S2; RRR; no murmur  Lungs:  clear to auscultation bilaterally, no wheezing, rhonchi or rales  Abd: soft, nontender, no hepatomegaly  Ext: no edema Musculoskeletal:  No deformities, BUE and BLE strength normal and equal Skin: warm and dry  Neuro:  CNs 2-12 intact, no focal abnormalities noted Psych:  Normal affect    EKG:  The ECG that was done was personally reviewed and demonstrates sinus with subtle ST elevation in leads V2-V3 with T wave inversion V4-V5.   Relevant CV Studies: none  Laboratory Data:  High Sensitivity Troponin:   Recent Labs  Lab 08/03/21 1105  TROPONINIHS 1,098*      Chemistry Recent Labs  Lab 08/03/21 1105  NA 135  K 4.3  CL 108  CO2 20*  GLUCOSE 193*  BUN 24*  CREATININE 1.46*  CALCIUM 8.1*  GFRNONAA 52*  ANIONGAP 7    Recent Labs  Lab 08/03/21 1105  PROT 6.3*  ALBUMIN 3.2*  AST 30  ALT 29  ALKPHOS 72  BILITOT 0.6   Lipids No results for input(s): CHOL, TRIG, HDL, LABVLDL, LDLCALC, CHOLHDL in the last 168 hours. Hematology Recent Labs  Lab 08/03/21 1105  WBC 8.1  RBC 4.69  HGB 15.2  HCT 43.8  MCV 93.4  MCH 32.4  MCHC 34.7  RDW 12.5  PLT 186   Thyroid No results for input(s): TSH, FREET4 in the last 168  hours. BNP Recent Labs  Lab 08/03/21 1105  BNP 703.0*    DDimer  Recent Labs  Lab 08/03/21 1105  DDIMER 0.63*     Radiology/Studies:  DG Chest 2 View  Result Date: 08/03/2021 CLINICAL DATA:  Cough and body aches EXAM: CHEST - 2 VIEW  COMPARISON:  08/21/2020 FINDINGS: Fine interstitial opacities bilaterally. Normal heart size and mediastinal contours. Trace effusions. IMPRESSION: Kerley lines usually reflecting interstitial edema. In the setting of body aches atypical infection is also considered. Electronically Signed   By: Jorje Guild M.D.   On: 08/03/2021 11:40     Assessment and Plan:   NSTEMI: Pt with chest pain, dyspnea, elevated troponin and abnormal EKG. Will plan emergent cardiac cath.  Acute CHF: Evidence of volume overload. BNP elevated. Pulmonary edema on chest x-ray. Lasix post cath.    Severity of Illness: The appropriate patient status for this patient is INPATIENT. Inpatient status is judged to be reasonable and necessary in order to provide the required intensity of service to ensure the patient's safety. The patient's presenting symptoms, physical exam findings, and initial radiographic and laboratory data in the context of their chronic comorbidities is felt to place them at high risk for further clinical deterioration. Furthermore, it is not anticipated that the patient will be medically stable for discharge from the hospital within 2 midnights of admission.   * I certify that at the point of admission it is my clinical judgment that the patient will require inpatient hospital care spanning beyond 2 midnights from the point of admission due to high intensity of service, high risk for further deterioration and high frequency of surveillance required.*   For questions or updates, please contact [place] Please consult [email or web address] for contact info under     Signed, Lauree Chandler, MD  08/03/2021 1:38 PM

## 2021-08-04 ENCOUNTER — Inpatient Hospital Stay (HOSPITAL_COMMUNITY): Payer: Medicare HMO

## 2021-08-04 DIAGNOSIS — I2510 Atherosclerotic heart disease of native coronary artery without angina pectoris: Secondary | ICD-10-CM | POA: Diagnosis not present

## 2021-08-04 DIAGNOSIS — I213 ST elevation (STEMI) myocardial infarction of unspecified site: Secondary | ICD-10-CM | POA: Diagnosis not present

## 2021-08-04 DIAGNOSIS — E119 Type 2 diabetes mellitus without complications: Secondary | ICD-10-CM

## 2021-08-04 LAB — ECHOCARDIOGRAM COMPLETE
AR max vel: 2.41 cm2
AV Area VTI: 2.35 cm2
AV Area mean vel: 2.14 cm2
AV Mean grad: 3 mmHg
AV Peak grad: 5 mmHg
Ao pk vel: 1.12 m/s
Area-P 1/2: 4.41 cm2
Calc EF: 33.4 %
Height: 71 in
S' Lateral: 4.9 cm
Single Plane A2C EF: 34.5 %
Single Plane A4C EF: 34.4 %
Weight: 2880 oz

## 2021-08-04 LAB — CBC
HCT: 40.1 % (ref 39.0–52.0)
Hemoglobin: 14.3 g/dL (ref 13.0–17.0)
MCH: 32.4 pg (ref 26.0–34.0)
MCHC: 35.7 g/dL (ref 30.0–36.0)
MCV: 90.7 fL (ref 80.0–100.0)
Platelets: 176 10*3/uL (ref 150–400)
RBC: 4.42 MIL/uL (ref 4.22–5.81)
RDW: 12.1 % (ref 11.5–15.5)
WBC: 7.7 10*3/uL (ref 4.0–10.5)
nRBC: 0 % (ref 0.0–0.2)

## 2021-08-04 LAB — BASIC METABOLIC PANEL
Anion gap: 9 (ref 5–15)
BUN: 23 mg/dL (ref 8–23)
CO2: 20 mmol/L — ABNORMAL LOW (ref 22–32)
Calcium: 8.1 mg/dL — ABNORMAL LOW (ref 8.9–10.3)
Chloride: 106 mmol/L (ref 98–111)
Creatinine, Ser: 1.45 mg/dL — ABNORMAL HIGH (ref 0.61–1.24)
GFR, Estimated: 52 mL/min — ABNORMAL LOW (ref >60)
Glucose, Bld: 143 mg/dL — ABNORMAL HIGH (ref 70–99)
Potassium: 3.8 mmol/L (ref 3.5–5.1)
Sodium: 135 mmol/L (ref 135–145)

## 2021-08-04 LAB — GLUCOSE, CAPILLARY
Glucose-Capillary: 120 mg/dL — ABNORMAL HIGH (ref 70–99)
Glucose-Capillary: 80 mg/dL (ref 70–99)
Glucose-Capillary: 137 mg/dL — ABNORMAL HIGH (ref 70–99)
Glucose-Capillary: 68 mg/dL — ABNORMAL LOW (ref 70–99)
Glucose-Capillary: 151 mg/dL — ABNORMAL HIGH (ref 70–99)

## 2021-08-04 NOTE — Progress Notes (Signed)
Echocardiogram 2D Echocardiogram has been performed.  Arlyss Gandy 08/04/2021, 4:12 PM

## 2021-08-04 NOTE — Progress Notes (Signed)
Progress Note  Patient Name: Jose Clark Date of Encounter: 08/04/2021  [facility] Cardiologist: None   Subjective   Jose Clark is doing well this AM. He is chest pain free. Lying flat  Inpatient Medications    Scheduled Meds:  aspirin  81 mg Oral Daily   atorvastatin  80 mg Oral Daily   empagliflozin  25 mg Oral Daily   furosemide  40 mg Intravenous Daily   insulin aspart  0-15 Units Subcutaneous TID WC   insulin aspart  3 Units Subcutaneous TID WC   insulin glargine-yfgn  40 Units Subcutaneous QHS   sodium chloride flush  3 mL Intravenous Q12H   ticagrelor  90 mg Oral BID   Continuous Infusions:  sodium chloride     PRN Meds: sodium chloride, acetaminophen, ondansetron (ZOFRAN) IV, oxyCODONE, sodium chloride flush   Vital Signs    Vitals:   08/03/21 2018 08/04/21 0036 08/04/21 0550 08/04/21 0840  BP: 115/76 114/77 121/77 114/69  Pulse: 95 89 85 99  Resp: 20 18 18 17   Temp: 98.5 F (36.9 C) 98 F (36.7 C) 98.7 F (37.1 C) 97.8 F (36.6 C)  TempSrc: Oral Oral Oral Axillary  SpO2: 96% 98% 100% 100%  Weight:      Height:        Intake/Output Summary (Last 24 hours) at 08/04/2021 0856 Last data filed at 08/04/2021 0000 Gross per 24 hour  Intake 490 ml  Output 900 ml  Net -410 ml   Last 3 Weights 08/03/2021 08/21/2020 08/19/2018  Weight (lbs) 180 lb 180 lb 180 lb  Weight (kg) 81.647 kg 81.647 kg 81.647 kg      Telemetry    NSR - Personally Reviewed  ECG    NSR, subtle anterior STE improved, TWI  V5, mild ST depression V6 - Personally Reviewed  Physical Exam   Vitals:   08/04/21 0550 08/04/21 0840  BP: 121/77 114/69  Pulse: 85 99  Resp: 18 17  Temp: 98.7 F (37.1 C) 97.8 F (36.6 C)  SpO2: 100% 100%    GEN: No acute distress.  Lying flat; comfortable HEENT: moist mucous membranes Cardiac: RRR, no murmurs, rubs, or gallops.  Respiratory: nl wob, no coarse breath sounds, moving air well GI: non-distended  MS: No edema; No  deformity. Ext: right radial site- clean and dry; pulse 2+ Neuro:  Nonfocal  Psych: Normal affect   Labs    High Sensitivity Troponin:   Recent Labs  Lab 08/03/21 1105 08/03/21 1247  TROPONINIHS 1,098* 958*     Chemistry Recent Labs  Lab 08/03/21 1105 08/04/21 0206  NA 135 135  K 4.3 3.8  CL 108 106  CO2 20* 20*  GLUCOSE 193* 143*  BUN 24* 23  CREATININE 1.46* 1.45*  CALCIUM 8.1* 8.1*  PROT 6.3*  --   ALBUMIN 3.2*  --   AST 30  --   ALT 29  --   ALKPHOS 72  --   BILITOT 0.6  --   GFRNONAA 52* 52*  ANIONGAP 7 9    Lipids No results for input(s): CHOL, TRIG, HDL, LABVLDL, LDLCALC, CHOLHDL in the last 168 hours.  Hematology Recent Labs  Lab 08/03/21 1105 08/04/21 0206  WBC 8.1 7.7  RBC 4.69 4.42  HGB 15.2 14.3  HCT 43.8 40.1  MCV 93.4 90.7  MCH 32.4 32.4  MCHC 34.7 35.7  RDW 12.5 12.1  PLT 186 176   Thyroid No results for input(s): TSH, FREET4 in the  last 168 hours.  BNP Recent Labs  Lab 08/03/21 1105  BNP 703.0*    DDimer  Recent Labs  Lab 08/03/21 1105  DDIMER 0.63*     Radiology    DG Chest 2 View  Result Date: 08/03/2021 CLINICAL DATA:  Cough and body aches EXAM: CHEST - 2 VIEW COMPARISON:  08/21/2020 FINDINGS: Fine interstitial opacities bilaterally. Normal heart size and mediastinal contours. Trace effusions. IMPRESSION: Kerley lines usually reflecting interstitial edema. In the setting of body aches atypical infection is also considered. Electronically Signed   By: Jorje Guild M.D.   On: 08/03/2021 11:40   CARDIAC CATHETERIZATION  Result Date: 08/03/2021   Prox RCA lesion is 99% stenosed.   Mid RCA to Dist RCA lesion is 100% stenosed.   Ost Cx to Mid Cx lesion is 40% stenosed.   2nd Mrg lesion is 60% stenosed.   Dist Cx lesion is 50% stenosed.   Prox LAD to Mid LAD lesion is 99% stenosed.   Mid LAD lesion is 40% stenosed.   A drug-eluting stent was successfully placed using a SYNERGY XD 2.75X16.   Post intervention, there is a 0%  residual stenosis. The LAD is a large vessel that courses to the apex. The proximal and mid LAD is calcified. The mid LAD has a focal, severe stenosis. The distal LAD has diffuse mild stenosis The Circumflex is a large caliber vessel with mild heavily calcified proximal stenosis. The obtuse marginal Keilynn Marano is a moderate caliber vessel with moderate ostial stenosis The RCA is a moderate caliber vessel with severe mid stenosis and total occlusion in the distal vessel. The distal RCA branches fill from left to right collaterals. Elevated LVEDP Recommendations: He is pain free. Will admit to telemetry unit. Will plan DAPT for one year with ASA/Brilinta. High intensity statin. Echo tomorrow. No beta blocker or Ace-inh today with soft BP. Will give Lasix 40 mg IV tonight. Resume home insulin therapy.    Cardiac Studies     Prox RCA lesion is 99% stenosed.   Mid RCA to Dist RCA lesion is 100% stenosed.   Ost Cx to Mid Cx lesion is 40% stenosed.   2nd Mrg lesion is 60% stenosed.   Dist Cx lesion is 50% stenosed.   Prox LAD to Mid LAD lesion is 99% stenosed.   Mid LAD lesion is 40% stenosed.   A drug-eluting stent was successfully placed using a SYNERGY XD 2.75X16.   Post intervention, there is a 0% residual stenosis.   The LAD is a large vessel that courses to the apex. The proximal and mid LAD is calcified. The mid LAD has a focal, severe stenosis. The distal LAD has diffuse mild stenosis The Circumflex is a large caliber vessel with mild heavily calcified proximal stenosis. The obtuse marginal Cheris Tweten is a moderate caliber vessel with moderate ostial stenosis The RCA is a moderate caliber vessel with severe mid stenosis and total occlusion in the distal vessel. The distal RCA branches fill from left to right collaterals.  Elevated LVEDP   Recommendations: He is pain free. Will admit to telemetry unit. Will plan DAPT for one year with ASA/Brilinta. High intensity statin. Echo tomorrow. No beta blocker or  Ace-inh today with soft BP. Will give Lasix 40 mg IV tonight. Resume home insulin therapy.  Patient Profile     Jose Clark is a 71 y.o. male with history of tobacco abuse and DM2 who [facility] cardiac cath.  The patient was seen in the ED this morning with c/o dyspnea. Chest pain episode last night and recurrent pain this am. First troponin 1098. BNP elevated. Chest x-ray with evidence of CHF. EKG with progression of subtle anterior ST elevation in leads V2 and V3 with T wave inversion and ST depression in leads V4, V5. Code STEMI called by Dr. Percival Spanish and Dr. Regenia Skeeter. Pt started on a heparin drip and transferred to [facility] for emergent cardiac cath found to have prox-mid 99% LAD lesion as well as 100% mid-distal RCA non culprit lesion CTO with L-R collaterals   Assessment & Plan    #Anterior STEMI: culprit prox-mid tight 99% LAD lesion s/p DES Synergy; TIMI 3 flow post intervention. BNP 700s on arrival - chest pain free - echo is pending - continue aspirin 81 mg daily - continue brilinta 90 mg BID - can continue IV lasix today can likely transition to oral soon - continue atorvastatin 80 mg daily - Dispo likely tomorrow if can ambulate and continue to lie flat on RA   #DM2 - repeat A1c -continue jardiance - continue home insulin  For questions or updates, please contact [facility] Please consult [email or web address] for contact info under        Signed, Janina Mayo, MD  08/04/2021, 8:56 AM

## 2021-08-04 NOTE — Progress Notes (Signed)
Pt reported 3/10 intermittent chest tightness.  O2 applied at 2 L per [place]. EKG obtained and Dr. Juliane Lack notified with no new orders. Oxycodone 5 mg given per prn orders per patient request for pain medication.

## 2021-08-05 ENCOUNTER — Other Ambulatory Visit (HOSPITAL_COMMUNITY): Payer: Self-pay

## 2021-08-05 ENCOUNTER — Encounter (HOSPITAL_COMMUNITY): Payer: Self-pay | Admitting: Cardiovascular Disease

## 2021-08-05 DIAGNOSIS — I214 Non-ST elevation (NSTEMI) myocardial infarction: Secondary | ICD-10-CM

## 2021-08-05 DIAGNOSIS — I255 Ischemic cardiomyopathy: Secondary | ICD-10-CM | POA: Diagnosis not present

## 2021-08-05 LAB — GLUCOSE, CAPILLARY
Glucose-Capillary: 65 mg/dL — ABNORMAL LOW (ref 70–99)
Glucose-Capillary: 84 mg/dL (ref 70–99)
Glucose-Capillary: 110 mg/dL — ABNORMAL HIGH (ref 70–99)
Glucose-Capillary: 141 mg/dL — ABNORMAL HIGH (ref 70–99)
Glucose-Capillary: 155 mg/dL — ABNORMAL HIGH (ref 70–99)

## 2021-08-05 LAB — CBC
HCT: 42.1 % (ref 39.0–52.0)
Hemoglobin: 15.1 g/dL (ref 13.0–17.0)
MCH: 32.6 pg (ref 26.0–34.0)
MCHC: 35.9 g/dL (ref 30.0–36.0)
MCV: 90.9 fL (ref 80.0–100.0)
Platelets: 193 10*3/uL (ref 150–400)
RBC: 4.63 MIL/uL (ref 4.22–5.81)
RDW: 12.1 % (ref 11.5–15.5)
WBC: 8.7 10*3/uL (ref 4.0–10.5)
nRBC: 0 % (ref 0.0–0.2)

## 2021-08-05 LAB — HEMOGLOBIN A1C
Hgb A1c MFr Bld: 7.8 % — ABNORMAL HIGH (ref 4.8–5.6)
Mean Plasma Glucose: 177.16 mg/dL

## 2021-08-05 MED ORDER — INSULIN ASPART 100 UNIT/ML IJ SOLN
0.0000 [IU] | Freq: Three times a day (TID) | INTRAMUSCULAR | Status: DC
  Administered 2021-08-05: 1 [IU] via SUBCUTANEOUS

## 2021-08-05 MED ORDER — METOPROLOL SUCCINATE ER 25 MG PO TB24
12.5000 mg | ORAL_TABLET | Freq: Every day | ORAL | Status: DC
  Administered 2021-08-05 – 2021-08-06 (×2): 12.5 mg via ORAL
  Filled 2021-08-05 (×2): qty 1

## 2021-08-05 MED ORDER — INSULIN GLARGINE-YFGN 100 UNIT/ML ~~LOC~~ SOLN
30.0000 [IU] | Freq: Every day | SUBCUTANEOUS | Status: DC
  Administered 2021-08-05: 30 [IU] via SUBCUTANEOUS
  Filled 2021-08-05 (×3): qty 0.3

## 2021-08-05 MED ORDER — INSULIN GLARGINE-YFGN 100 UNIT/ML ~~LOC~~ SOLN
35.0000 [IU] | Freq: Every day | SUBCUTANEOUS | Status: DC
  Filled 2021-08-05: qty 0.35

## 2021-08-05 NOTE — Progress Notes (Signed)
CARDIAC REHAB PHASE I   PRE:  Rate/Rhythm: 94 SR with PVC    BP: lying 115/87, standing then sitting 109/66  Pt recently awakened and CBG 69, received orange juice. Recheck 85 however he c/o weakness, dizziness. Worse with standing. Continues to c/o weakness and preferred to lay back down. Will allow to rest now and f/u after breakfast. Daughter in room and supportive. [place], ACSM 08/05/2021 8:24 AM

## 2021-08-05 NOTE — Progress Notes (Signed)
CARDIAC REHAB PHASE I   PRE:  Rate/Rhythm: 90 SR    BP: sitting 98/66    SaO2:   MODE:  Ambulation: 470 ft   POST:  Rate/Rhythm: 102 ST    BP: sitting 113/70     SaO2:   Tolerated well, no c/o. VSS. Discussed with pt and daughter MI, stent, Brilinta importance, restrictions, diet, exercise, smoking cessation, NTG, daily wts, and CRPII. Pt very receptive. Will refer to [place].  Pt sts he only smokes when he drinks beer and is not going to do that anymore.  [id number]  [place], ACSM 08/05/2021 10:46 AM

## 2021-08-05 NOTE — Progress Notes (Incomplete)
Hello,  The Pharmacy team is conducting a discharge transitions of care quality improvement initiative. The recommendations below are for your consideration.    Jose Clark is a 71 y.o. male (MRN: [id number], DOB: [birth year]) who was recently hospitalized on 08/03/2021 for ***. They are anticipated to visit your clinic for post-discharge follow-up and may benefit from assistance with medication initiation and/or access.     Relevant medication access issues which may benefit from further intervention include:   Please consider the following therapy recommendations at follow-up appointment below:     Other relevant medication issues from their recent admission include:   We appreciate your assistance with the implementation of these recommendations. Please let us know if there is anything we can help you with at this time.       Thanks!   Erin Hearing PharmD., BCPS Clinical Pharmacist 08/05/2021 8:43 AM

## 2021-08-05 NOTE — Progress Notes (Addendum)
Progress Note  Patient Name: Jose Clark Date of Encounter: 08/05/2021  [facility] Cardiologist: None New  Subjective   Weak this morning. Blood sugar low. No chest pain or dyspnea.   Inpatient Medications    Scheduled Meds:  aspirin  81 mg Oral Daily   atorvastatin  80 mg Oral Daily   empagliflozin  25 mg Oral Daily   insulin aspart  0-9 Units Subcutaneous TID WC   insulin glargine-yfgn  40 Units Subcutaneous QHS   metoprolol succinate  12.5 mg Oral Daily   sodium chloride flush  3 mL Intravenous Q12H   ticagrelor  90 mg Oral BID   Continuous Infusions:  sodium chloride     PRN Meds: sodium chloride, acetaminophen, ondansetron (ZOFRAN) IV, oxyCODONE, sodium chloride flush   Vital Signs    Vitals:   08/04/21 0550 08/04/21 0840 08/04/21 1948 08/05/21 0424  BP: 121/77 114/69 122/69 119/80  Pulse: 85 99  87  Resp: 18 17    Temp: 98.7 F (37.1 C) 97.8 F (36.6 C) 98.6 F (37 C) 98.9 F (37.2 C)  TempSrc: Oral Axillary Oral Oral  SpO2: 100% 100% 98% 98%  Weight:      Height:        Intake/Output Summary (Last 24 hours) at 08/05/2021 0844 Last data filed at 08/05/2021 0809 Gross per 24 hour  Intake 240 ml  Output --  Net 240 ml   Last 3 Weights 08/03/2021 08/21/2020 08/19/2018  Weight (lbs) 180 lb 180 lb 180 lb  Weight (kg) 81.647 kg 81.647 kg 81.647 kg      Telemetry    Sinus rhythm, PVC - Personally Reviewed  ECG    N/a  Physical Exam   GEN: No acute distress.   Neck: No JVD Cardiac: RRR, no murmurs, rubs, or gallops.  Respiratory: Clear to auscultation bilaterally. GI: Soft, nontender, non-distended  MS: No edema; No deformity. Neuro:  Nonfocal  Psych: Normal affect   Labs    High Sensitivity Troponin:   Recent Labs  Lab 08/03/21 1105 08/03/21 1247  TROPONINIHS 1,098* 958*     Chemistry Recent Labs  Lab 08/03/21 1105 08/04/21 0206  NA 135 135  K 4.3 3.8  CL 108 106  CO2 20* 20*  GLUCOSE 193* 143*  BUN 24* 23   CREATININE 1.46* 1.45*  CALCIUM 8.1* 8.1*  PROT 6.3*  --   ALBUMIN 3.2*  --   AST 30  --   ALT 29  --   ALKPHOS 72  --   BILITOT 0.6  --   GFRNONAA 52* 52*  ANIONGAP 7 9     Hematology Recent Labs  Lab 08/03/21 1105 08/04/21 0206 08/05/21 0212  WBC 8.1 7.7 8.7  RBC 4.69 4.42 4.63  HGB 15.2 14.3 15.1  HCT 43.8 40.1 42.1  MCV 93.4 90.7 90.9  MCH 32.4 32.4 32.6  MCHC 34.7 35.7 35.9  RDW 12.5 12.1 12.1  PLT 186 176 193   BNP Recent Labs  Lab 08/03/21 1105  BNP 703.0*    DDimer  Recent Labs  Lab 08/03/21 1105  DDIMER 0.63*     Radiology    DG Chest 2 View  Result Date: 08/03/2021 CLINICAL DATA:  Cough and body aches EXAM: CHEST - 2 VIEW COMPARISON:  08/21/2020 FINDINGS: Fine interstitial opacities bilaterally. Normal heart size and mediastinal contours. Trace effusions. IMPRESSION: Kerley lines usually reflecting interstitial edema. In the setting of body aches atypical infection is also considered. Electronically Signed  By: Jorje Guild M.D.   On: 08/03/2021 11:40   CARDIAC CATHETERIZATION  Result Date: 08/03/2021   Prox RCA lesion is 99% stenosed.   Mid RCA to Dist RCA lesion is 100% stenosed.   Ost Cx to Mid Cx lesion is 40% stenosed.   2nd Mrg lesion is 60% stenosed.   Dist Cx lesion is 50% stenosed.   Prox LAD to Mid LAD lesion is 99% stenosed.   Mid LAD lesion is 40% stenosed.   A drug-eluting stent was successfully placed using a SYNERGY XD 2.75X16.   Post intervention, there is a 0% residual stenosis. The LAD is a large vessel that courses to the apex. The proximal and mid LAD is calcified. The mid LAD has a focal, severe stenosis. The distal LAD has diffuse mild stenosis The Circumflex is a large caliber vessel with mild heavily calcified proximal stenosis. The obtuse marginal branch is a moderate caliber vessel with moderate ostial stenosis The RCA is a moderate caliber vessel with severe mid stenosis and total occlusion in the distal vessel. The distal  RCA branches fill from left to right collaterals. Elevated LVEDP Recommendations: He is pain free. Will admit to telemetry unit. Will plan DAPT for one year with ASA/Brilinta. High intensity statin. Echo tomorrow. No beta blocker or Ace-inh today with soft BP. Will give Lasix 40 mg IV tonight. Resume home insulin therapy.   ECHOCARDIOGRAM COMPLETE  Result Date: 08/04/2021    ECHOCARDIOGRAM REPORT   Patient Name:   SEBASTHIAN STAILEY Date of Exam: 08/04/2021 Medical Rec #:  [id number]       Height:       71.0 in Accession #:    [id number]      Weight:       180.0 lb Date of Birth:  [birth year]        BSA:          2.016 m Patient Age:    71 years        BP:           121/77 mmHg Patient Gender: M               HR:           85 bpm. Exam Location:  Inpatient Procedure: 2D Echo Indications:    CAD  History:        Patient has no prior history of Echocardiogram examinations.                 Risk Factors:Diabetes.  Sonographer:    Arlyss Gandy Referring Phys: [place]  1. Apical akinesis. Left ventricular ejection fraction, by estimation, is 30 to 35%. The left ventricle has moderately decreased function. The left ventricle demonstrates global hypokinesis. The left ventricular internal cavity size was mildly dilated. Left ventricular diastolic parameters are consistent with Grade I diastolic dysfunction (impaired relaxation).  2. Right ventricular systolic function is normal. The right ventricular size is normal.  3. Left atrial size was mild to moderately dilated.  4. Moderate functional mitral regurgitation. The mitral valve is degenerative.  5. The aortic valve is grossly normal. Aortic valve regurgitation is not visualized.  6. The inferior vena cava is normal in size with greater than 50% respiratory variability, suggesting right atrial pressure of 3 mmHg. Comparison(s): No prior Echocardiogram. Conclusion(s)/Recommendation(s): Consider repeat limited study with contrast to r/o LV  thrombus. FINDINGS  Left Ventricle: Apical akinesis. Left ventricular ejection fraction, by estimation, is 30 to 35%.  The left ventricle has moderately decreased function. The left ventricle demonstrates global hypokinesis. The left ventricular internal cavity size was mildly dilated. There is no left ventricular hypertrophy. Left ventricular diastolic parameters are consistent with Grade I diastolic dysfunction (impaired relaxation). Right Ventricle: The right ventricular size is normal. No increase in right ventricular wall thickness. Right ventricular systolic function is normal. Left Atrium: Left atrial size was mild to moderately dilated. Right Atrium: Right atrial size was normal in size. Pericardium: There is no evidence of pericardial effusion. Mitral Valve: Moderate functional mitral regurgitation. The mitral valve is degenerative in appearance. Tricuspid Valve: The tricuspid valve is grossly normal. Tricuspid valve regurgitation is not demonstrated. Aortic Valve: The aortic valve is grossly normal. Aortic valve regurgitation is not visualized. Aortic valve mean gradient measures 3.0 mmHg. Aortic valve peak gradient measures 5.0 mmHg. Aortic valve area, by VTI measures 2.35 cm. Pulmonic Valve: The pulmonic valve was not well visualized. Pulmonic valve regurgitation is not visualized. Aorta: The aortic root and ascending aorta are structurally normal, with no evidence of dilitation. Venous: The inferior vena cava is normal in size with greater than 50% respiratory variability, suggesting right atrial pressure of 3 mmHg. IAS/Shunts: No atrial level shunt detected by color flow Doppler.  LEFT VENTRICLE PLAX 2D LVIDd:         5.90 cm      Diastology LVIDs:         4.90 cm      LV e' medial:    5.87 cm/s LV PW:         0.90 cm      LV E/e' medial:  14.9 LV IVS:        0.90 cm      LV e' lateral:   5.22 cm/s LVOT diam:     2.20 cm      LV E/e' lateral: 16.7 LV SV:         51 LV SV Index:   25 LVOT Area:     3.80  cm  LV Volumes (MOD) LV vol d, MOD A2C: 101.0 ml LV vol d, MOD A4C: 124.0 ml LV vol s, MOD A2C: 66.2 ml LV vol s, MOD A4C: 81.4 ml LV SV MOD A2C:     34.8 ml LV SV MOD A4C:     124.0 ml LV SV MOD BP:      37.4 ml RIGHT VENTRICLE RV Basal diam:  2.90 cm RV S prime:     12.70 cm/s TAPSE (M-mode): 1.7 cm LEFT ATRIUM             Index        RIGHT ATRIUM          Index LA diam:        3.40 cm 1.69 cm/m   RA Area:     9.14 cm LA Vol (A2C):   41.5 ml 20.58 ml/m  RA Volume:   15.20 ml 7.54 ml/m LA Vol (A4C):   40.1 ml 19.89 ml/m LA Biplane Vol: 41.2 ml 20.43 ml/m  AORTIC VALVE AV Area (Vmax):    2.41 cm AV Area (Vmean):   2.14 cm AV Area (VTI):     2.35 cm AV Vmax:           112.00 cm/s AV Vmean:          79.200 cm/s AV VTI:            0.215 m AV Peak Grad:  5.0 mmHg AV Mean Grad:      3.0 mmHg LVOT Vmax:         70.90 cm/s LVOT Vmean:        44.600 cm/s LVOT VTI:          0.133 m LVOT/AV VTI ratio: 0.62  AORTA Ao Root diam: 3.00 cm Ao Asc diam:  3.10 cm MITRAL VALVE MV Area (PHT): 4.41 cm     SHUNTS MV Decel Time: 172 msec     Systemic VTI:  0.13 m MV E velocity: 87.30 cm/s   Systemic Diam: 2.20 cm MV A velocity: 108.00 cm/s MV E/A ratio:  0.81 Mary Scientist, physiological signed by Phineas Inches Signature Date/Time: 08/04/2021/4:27:20 PM    Final     Cardiac Studies   Coronary/Graft Acute MI Revascularization 08/03/2020  LEFT HEART CATH AND CORONARY ANGIOGRAPHY   Conclusion      Prox RCA lesion is 99% stenosed.   Mid RCA to Dist RCA lesion is 100% stenosed.   Ost Cx to Mid Cx lesion is 40% stenosed.   2nd Mrg lesion is 60% stenosed.   Dist Cx lesion is 50% stenosed.   Prox LAD to Mid LAD lesion is 99% stenosed.   Mid LAD lesion is 40% stenosed.   A drug-eluting stent was successfully placed using a SYNERGY XD 2.75X16.   Post intervention, there is a 0% residual stenosis.   The LAD is a large vessel that courses to the apex. The proximal and mid LAD is calcified. The mid LAD has a focal,  severe stenosis. The distal LAD has diffuse mild stenosis The Circumflex is a large caliber vessel with mild heavily calcified proximal stenosis. The obtuse marginal branch is a moderate caliber vessel with moderate ostial stenosis The RCA is a moderate caliber vessel with severe mid stenosis and total occlusion in the distal vessel. The distal RCA branches fill from left to right collaterals.  Elevated LVEDP   Recommendations: He is pain free. Will admit to telemetry unit. Will plan DAPT for one year with ASA/Brilinta. High intensity statin. Echo tomorrow. No beta blocker or Ace-inh today with soft BP. Will give Lasix 40 mg IV tonight. Resume home insulin therapy.   Echo 08/04/2020 1. Apical akinesis. Left ventricular ejection fraction, by estimation, is  30 to 35%. The left ventricle has moderately decreased function. The left  ventricle demonstrates global hypokinesis. The left ventricular internal  cavity size was mildly dilated.  Left ventricular diastolic parameters are consistent with Grade I  diastolic dysfunction (impaired relaxation).   2. Right ventricular systolic function is normal. The right ventricular  size is normal.   3. Left atrial size was mild to moderately dilated.   4. Moderate functional mitral regurgitation. The mitral valve is  degenerative.   5. The aortic valve is grossly normal. Aortic valve regurgitation is not  visualized.   6. The inferior vena cava is normal in size with greater than 50%  respiratory variability, suggesting right atrial pressure of 3 mmHg.   Comparison(s): No prior Echocardiogram.   Patient Profile     71 y.o. male  with history of tobacco abuse and DM2 who is transferred to [facility] from [facility] for cardiac cath.   The patient was seen in the ED this morning with c/o dyspnea. Chest pain episode last night and recurrent pain this am. First troponin 1098. BNP elevated. Chest x-ray with evidence of CHF. EKG with progression of  subtle anterior ST elevation in leads  V2 and V3 with T wave inversion and ST depression in leads V4, V5. Code STEMI called by Dr. Percival Spanish and Dr. Regenia Skeeter. Pt started on a heparin drip and transferred to [facility] for emergent cardiac cath.  Assessment & Plan    Anterior STEMI - Cath showed prox-mid 99% LAD lesion as well as 100% mid-distal RCA non culprit lesion CTO with L-R collaterals. S/p PTCA/DES to LAD. Medical therapy for other disease. No chest pain. - Continue ASA, Brilinta and statin  - Add low dose BB  2. ICM - LVEDP 79mm HG by cath. Echo showed LVEF of 30-35% with apical akinesis. Grade 1 DD. He got IV lasix 40mg  x 2. No strict I & O. Appears euvolemic.  - BP has been improving gradually. Add Toprol XL 12.5mg  daily.  - Will add ARB/Entresto as blood pressure allows.  - continue Jardiance - Will give PRN lasix  3. DM - Intermittent low blood sugar - Will cut back on insuline coverage - Continue Jardiance  4. HLD - No results found for requested labs within last 8760 hours.  - Continue statin   5. Presume CKD IIIa - Scr was 1.52 on 08/2020. Here 1.46>>1.45. Will recheck.     For questions or updates, please contact [place] Please consult [email or web address] for contact info under        Signed, Leanor Kail, PA  08/05/2021, 8:44 AM     Agree with note by Robbie Lis PA-C  Postop day #2 anterior STEMI treated with PCI and drug-eluting stenting of the mid LAD.  The patient does have a subtotally occluded dominant RCA with left-to-right collaterals and moderate LV dysfunction with EF in the 30 to 35% range.  Blood pressure is soft.  Other problems include Diabetes and hyperlipidemia.  He does have CKD 3 with creatinines in the mid 1 range.  He is on aspirin and Brilinta as well as beta-blocker and Jardiance.  I do not think he needs Lasix.  At this point, I am hesitant to begin an ACE/ARB and/or Entresto because of his renal function.  We will have cardiac rehab  ambulate him today and anticipate discharge tomorrow if he tolerates his medications and has no recurrent symptoms.  Lorretta Harp, M.D., [place], [facility], [place] [address] [place], [place]  [id number]  [phone] 08/05/2021 9:57 AM

## 2021-08-05 NOTE — Care Management (Signed)
°  Transition of Care [facility]) Screening Note   Patient Details  Name: Jose Clark Date of Birth: 1951-03-20   Transition of Care [facility]) CM/SW Contact:    Bethena Roys, RN Phone Number: 08/05/2021, 1:57 PM    Transition of Care Department [facility]) has reviewed the patient and no TOC needs have been identified at this time. If new patient transition needs arise, please place a TOC consult.

## 2021-08-05 NOTE — Progress Notes (Signed)
Gave 240 of OJ for sugar in the 60s.  Patient is asymptomatic.

## 2021-08-05 NOTE — TOC Benefit Eligibility Note (Signed)
Patient Teacher, English as a foreign language completed.    The patient is currently admitted and upon discharge could be taking Brilinta 90 mg.  The current 30 day co-pay is, $4.30.   The patient is currently admitted and upon discharge could be taking Entresto 24-26 mg.  The current 30 day co-pay is, $4.30.   The patient is insured through [employer] Patient Advocate Specialist [place] Patient Advocate Team Direct Number: [phone]  Fax: [phone]

## 2021-08-05 NOTE — Care Management Important Message (Signed)
Important Message  Patient Details  Name: Jose Clark MRN: [id number] Date of Birth: 05-15-1951   Medicare Important Message Given:  Yes     Orbie Pyo 08/05/2021, 3:27 PM

## 2021-08-05 NOTE — Progress Notes (Signed)
Inpatient Diabetes Program Recommendations  AACE/ADA: New Consensus Statement on Inpatient Glycemic Control  Target Ranges:  Prepandial:   less than 140 mg/dL      Peak postprandial:   less than 180 mg/dL (1-2 hours)      Critically ill patients:  140 - 180 mg/dL    Latest Reference Range & Units 08/05/21 08:02 08/05/21 08:18  Glucose-Capillary 70 - 99 mg/dL 65 (L) 84    Latest Reference Range & Units 08/04/21 07:41 08/04/21 12:38 08/04/21 17:08 08/04/21 20:19 08/04/21 21:19  Glucose-Capillary 70 - 99 mg/dL 120 (H) 80 137 (H) 68 (L) 151 (H)   Review of Glycemic Control  Diabetes history: DM2 Outpatient Diabetes medications: Lantus 40 units QAM, 70/30 10-15 units daily if needed if glucose >180 mg/dl, Jardiance 25 mg daily if glucose >120 mg/dl, Rybelsus 7 mg daily (not taking Rybelsus 7 mg daily) Current orders for Inpatient glycemic control: Semglee 35 units QHS, Novolog 0-9 units TID with meals, Jardiance 25 mg daily  Inpatient Diabetes Program Recommendations:    Insulin: Noted patient received Semglee 40 units last night and fasting glucose 65 mg/dl this morning. If patient has any other issues with hypoglycemia, consider decreasing Semglee to 30 units QHS.  Thanks, Barnie Alderman, RN, MSN, CDE Diabetes Coordinator Inpatient Diabetes Program [phone] (Team Pager from 8am to 5pm)

## 2021-08-06 ENCOUNTER — Encounter (HOSPITAL_COMMUNITY): Payer: Self-pay | Admitting: Cardiology

## 2021-08-06 DIAGNOSIS — I255 Ischemic cardiomyopathy: Secondary | ICD-10-CM | POA: Diagnosis not present

## 2021-08-06 DIAGNOSIS — I214 Non-ST elevation (NSTEMI) myocardial infarction: Secondary | ICD-10-CM | POA: Diagnosis not present

## 2021-08-06 LAB — LIPID PANEL
Cholesterol: 180 mg/dL (ref 0–200)
HDL: 51 mg/dL (ref >40)
LDL Cholesterol: 110 mg/dL — ABNORMAL HIGH (ref 0–99)
Total CHOL/HDL Ratio: 3.5 RATIO
Triglycerides: 96 mg/dL (ref <150)
VLDL: 19 mg/dL (ref 0–40)

## 2021-08-06 LAB — CBC
HCT: 41.7 % (ref 39.0–52.0)
Hemoglobin: 14.5 g/dL (ref 13.0–17.0)
MCH: 32.2 pg (ref 26.0–34.0)
MCHC: 34.8 g/dL (ref 30.0–36.0)
MCV: 92.5 fL (ref 80.0–100.0)
Platelets: 192 10*3/uL (ref 150–400)
RBC: 4.51 MIL/uL (ref 4.22–5.81)
RDW: 12.1 % (ref 11.5–15.5)
WBC: 6.7 10*3/uL (ref 4.0–10.5)
nRBC: 0 % (ref 0.0–0.2)

## 2021-08-06 LAB — BASIC METABOLIC PANEL
Anion gap: 10 (ref 5–15)
BUN: 22 mg/dL (ref 8–23)
CO2: 20 mmol/L — ABNORMAL LOW (ref 22–32)
Calcium: 8.4 mg/dL — ABNORMAL LOW (ref 8.9–10.3)
Chloride: 106 mmol/L (ref 98–111)
Creatinine, Ser: 1.41 mg/dL — ABNORMAL HIGH (ref 0.61–1.24)
GFR, Estimated: 54 mL/min — ABNORMAL LOW (ref >60)
Glucose, Bld: 169 mg/dL — ABNORMAL HIGH (ref 70–99)
Potassium: 4.1 mmol/L (ref 3.5–5.1)
Sodium: 136 mmol/L (ref 135–145)

## 2021-08-06 LAB — GLUCOSE, CAPILLARY: Glucose-Capillary: 70 mg/dL (ref 70–99)

## 2021-08-06 MED ORDER — LOSARTAN POTASSIUM 25 MG PO TABS
12.5000 mg | ORAL_TABLET | Freq: Every day | ORAL | Status: DC
  Administered 2021-08-06: 12.5 mg via ORAL
  Filled 2021-08-06: qty 1

## 2021-08-06 MED ORDER — FUROSEMIDE 20 MG PO TABS: 20.0000 mg | ORAL_TABLET | Freq: Every day | ORAL | 2 refills | Status: DC | PRN

## 2021-08-06 MED ORDER — TICAGRELOR 90 MG PO TABS: 90.0000 mg | ORAL_TABLET | Freq: Two times a day (BID) | ORAL | 3 refills | Status: DC

## 2021-08-06 MED ORDER — METOPROLOL SUCCINATE ER 25 MG PO TB24: 12.5000 mg | ORAL_TABLET | Freq: Every day | ORAL | 6 refills | Status: DC

## 2021-08-06 MED ORDER — LOSARTAN POTASSIUM 25 MG PO TABS: 12.5000 mg | ORAL_TABLET | Freq: Every day | ORAL | 6 refills | Status: DC

## 2021-08-06 MED ORDER — NITROGLYCERIN 0.4 MG SL SUBL
0.4000 mg | SUBLINGUAL_TABLET | SUBLINGUAL | 12 refills | Status: DC | PRN
Start: 2021-08-06 — End: 2024-01-27

## 2021-08-06 MED ORDER — METOPROLOL SUCCINATE ER 25 MG PO TB24: 12.5000 mg | ORAL_TABLET | Freq: Every day | ORAL | Status: DC

## 2021-08-06 MED ORDER — ATORVASTATIN CALCIUM 80 MG PO TABS: 80.0000 mg | ORAL_TABLET | Freq: Every day | ORAL | 3 refills | Status: DC

## 2021-08-06 MED ORDER — METOPROLOL SUCCINATE ER 25 MG PO TB24: 25.0000 mg | ORAL_TABLET | Freq: Every day | ORAL | Status: DC

## 2021-08-06 MED ORDER — ASPIRIN EC 81 MG PO TBEC: 81.0000 mg | DELAYED_RELEASE_TABLET | Freq: Every day | ORAL | 3 refills | Status: AC

## 2021-08-06 NOTE — Discharge Summary (Addendum)
Discharge Summary    Patient ID: Jose Clark MRN: [id number]; DOB: 1950/07/30  Admit date: 08/03/2021 Discharge date: 08/06/2021  PCP:  Celene Squibb, MD   [facility] Providers Cardiologist:  New, will be followed at [facility] office    Discharge Diagnoses    Principal Problem:   Anterior STEMI   DM   Tobacco abuse    CAD   ICM   Resumed CKD LLLa  Diagnostic Studies/Procedures    Coronary/Graft Acute MI Revascularization 08/03/2020  LEFT HEART CATH AND CORONARY ANGIOGRAPHY    Conclusion       Prox RCA lesion is 99% stenosed.   Mid RCA to Dist RCA lesion is 100% stenosed.   Ost Cx to Mid Cx lesion is 40% stenosed.   2nd Mrg lesion is 60% stenosed.   Dist Cx lesion is 50% stenosed.   Prox LAD to Mid LAD lesion is 99% stenosed.   Mid LAD lesion is 40% stenosed.   A drug-eluting stent was successfully placed using a SYNERGY XD 2.75X16.   Post intervention, there is a 0% residual stenosis.   The LAD is a large vessel that courses to the apex. The proximal and mid LAD is calcified. The mid LAD has a focal, severe stenosis. The distal LAD has diffuse mild stenosis The Circumflex is a large caliber vessel with mild heavily calcified proximal stenosis. The obtuse marginal branch is a moderate caliber vessel with moderate ostial stenosis The RCA is a moderate caliber vessel with severe mid stenosis and total occlusion in the distal vessel. The distal RCA branches fill from left to right collaterals.  Elevated LVEDP   Recommendations: He is pain free. Will admit to telemetry unit. Will plan DAPT for one year with ASA/Brilinta. High intensity statin. Echo tomorrow. No beta blocker or Ace-inh today with soft BP. Will give Lasix 40 mg IV tonight. Resume home insulin therapy.    Echo 08/04/2020 1. Apical akinesis. Left ventricular ejection fraction, by estimation, is  30 to 35%. The left ventricle has moderately decreased function. The left  ventricle demonstrates global  hypokinesis. The left ventricular internal  cavity size was mildly dilated.  Left ventricular diastolic parameters are consistent with Grade I  diastolic dysfunction (impaired relaxation).   2. Right ventricular systolic function is normal. The right ventricular  size is normal.   3. Left atrial size was mild to moderately dilated.   4. Moderate functional mitral regurgitation. The mitral valve is  degenerative.   5. The aortic valve is grossly normal. Aortic valve regurgitation is not  visualized.   6. The inferior vena cava is normal in size with greater than 50%  respiratory variability, suggesting right atrial pressure of 3 mmHg.   Comparison(s): No prior Echocardiogram.     History of Present Illness     Jose Clark is a 71 y.o. male with history of tobacco abuse and DM2 who is transferred to [facility] from [facility] for cardiac cath.    The patient was seen in the ED this morning with c/o dyspnea. Chest pain episode last night and recurrent pain this am. First troponin 1098. BNP elevated. Chest x-ray with evidence of CHF. EKG with progression of subtle anterior ST elevation in leads V2 and V3 with T wave inversion and ST depression in leads V4, V5. Code STEMI called by Dr. Percival Spanish and Dr. Regenia Skeeter. Pt started on a heparin drip and transferred to [facility] for emergent cardiac cath.  Hospital Course  Consultants: None   Anterior STEMI - Cath showed prox-mid 99% LAD lesion as well as 100% mid-distal RCA non culprit lesion CTO with L-R collaterals. S/p PTCA/DES to LAD. Medical therapy for other disease. No chest pain. Ambulated well. CRP II as outpatint.  - Continue ASA, Brilinta, BB, ARB and statin    2. ICM - LVEDP 17mm HG by cath. Echo showed LVEF of 30-35% with apical akinesis. Grade 1 DD. He got IV lasix 40mg  x 2. No strict I & O. Appears euvolemic.  - Unable to add GDMT due to soft blood pressure and renal function .  - Tolerated Toprol XL  12.5mg  Qd - Add Losartan  12.5mg  qd (stopped lisinopril 2.5mg  qd). Plan to switch to [facility] if stable blood pressure and renal function as outpatient.  - Continue Jardiance  - Will give PRN lasix   3. DM - Intermittent low blood sugar. Seen by Diabetic co-ordinate who recommended back on insuline coverage with improvement.  - Continue home Jardiance   4. HLD -08/06/2021: Cholesterol 180; HDL 51; LDL Cholesterol 110; Triglycerides 96; VLDL 19  - Continue statin  -.Consider OP f/u labs 6-8 weeks given statin initiation this admission.    5. Presume CKD IIIa - Scr was 1.52 on 08/2020. Here 1.46>>1.45>>1.41. - Started Losartan at discharge. BMP at follow up.     Did the patient have an acute coronary syndrome (MI, NSTEMI, STEMI, etc) this admission?:  Yes                               AHA/ACC Clinical Performance & Quality Measures: Aspirin prescribed? - Yes ADP Receptor Inhibitor (Plavix/Clopidogrel, Brilinta/Ticagrelor or Effient/Prasugrel) prescribed (includes medically managed patients)? - Yes Beta Blocker prescribed? - Yes High Intensity Statin (Lipitor 40-80mg  or Crestor 20-40mg ) prescribed? - Yes EF assessed during THIS hospitalization? - Yes For EF <40%, was ACEI/ARB prescribed? - Yes For EF <40%, Aldosterone Antagonist (Spironolactone or Eplerenone) prescribed? - No - Reason:  Will plan as outpatient  Cardiac Rehab Phase II ordered (including medically managed patients)? - Yes       The patient will be scheduled for a TOC follow up appointment in 7 days.  A message has been sent to the [facility] and Scheduling Pool at the office where the patient should be seen for follow up.  _____________  Discharge Vitals Blood pressure 117/75, pulse 83, temperature 98.9 F (37.2 C), temperature source Oral, resp. rate 16, height 5\' 11"  (1.803 m), weight 81.6 kg, SpO2 96 %.  Filed Weights   08/03/21 1046  Weight: 81.6 kg   Physical Exam Constitutional:      Appearance: He is well-developed.  HENT:      Head: Normocephalic and atraumatic.  Eyes:     Extraocular Movements: Extraocular movements intact.     Pupils: Pupils are equal, round, and reactive to light.  Cardiovascular:     Rate and Rhythm: Normal rate and regular rhythm.  Pulmonary:     Effort: Pulmonary effort is normal.     Breath sounds: Normal breath sounds.  Musculoskeletal:        General: Normal range of motion.  Skin:    General: Skin is warm and dry.  Neurological:     General: No focal deficit present.     Mental Status: He is alert and oriented to person, place, and time.  Psychiatric:        Mood and Affect: Mood normal.  Behavior: Behavior normal.    Labs & Radiologic Studies    CBC Recent Labs    08/03/21 1105 08/04/21 0206 08/05/21 0212 08/06/21 0333  WBC 8.1   < > 8.7 6.7  NEUTROABS 3.9  --   --   --   HGB 15.2   < > 15.1 14.5  HCT 43.8   < > 42.1 41.7  MCV 93.4   < > 90.9 92.5  PLT 186   < > 193 192   < > = values in this interval not displayed.   Basic Metabolic Panel Recent Labs    08/04/21 0206 08/06/21 0333  NA 135 136  K 3.8 4.1  CL 106 106  CO2 20* 20*  GLUCOSE 143* 169*  BUN 23 22  CREATININE 1.45* 1.41*  CALCIUM 8.1* 8.4*   Liver Function Tests Recent Labs    08/03/21 1105  AST 30  ALT 29  ALKPHOS 72  BILITOT 0.6  PROT 6.3*  ALBUMIN 3.2*    High Sensitivity Troponin:   Recent Labs  Lab 08/03/21 1105 08/03/21 1247  TROPONINIHS 1,098* 958*    BNP Invalid input(s): POCBNP D-Dimer Recent Labs    08/03/21 1105  DDIMER 0.63*   Hemoglobin A1C Recent Labs    08/05/21 0212  HGBA1C 7.8*   Fasting Lipid Panel Recent Labs    08/06/21 0333  CHOL 180  HDL 51  LDLCALC 110*  TRIG 96  CHOLHDL 3.5   _____________  DG Chest 2 View  Result Date: 08/03/2021 CLINICAL DATA:  Cough and body aches EXAM: CHEST - 2 VIEW COMPARISON:  08/21/2020 FINDINGS: Fine interstitial opacities bilaterally. Normal heart size and mediastinal contours. Trace effusions.  IMPRESSION: Kerley lines usually reflecting interstitial edema. In the setting of body aches atypical infection is also considered. Electronically Signed   By: Jorje Guild M.D.   On: 08/03/2021 11:40   CARDIAC CATHETERIZATION  Result Date: 08/03/2021   Prox RCA lesion is 99% stenosed.   Mid RCA to Dist RCA lesion is 100% stenosed.   Ost Cx to Mid Cx lesion is 40% stenosed.   2nd Mrg lesion is 60% stenosed.   Dist Cx lesion is 50% stenosed.   Prox LAD to Mid LAD lesion is 99% stenosed.   Mid LAD lesion is 40% stenosed.   A drug-eluting stent was successfully placed using a SYNERGY XD 2.75X16.   Post intervention, there is a 0% residual stenosis. The LAD is a large vessel that courses to the apex. The proximal and mid LAD is calcified. The mid LAD has a focal, severe stenosis. The distal LAD has diffuse mild stenosis The Circumflex is a large caliber vessel with mild heavily calcified proximal stenosis. The obtuse marginal branch is a moderate caliber vessel with moderate ostial stenosis The RCA is a moderate caliber vessel with severe mid stenosis and total occlusion in the distal vessel. The distal RCA branches fill from left to right collaterals. Elevated LVEDP Recommendations: He is pain free. Will admit to telemetry unit. Will plan DAPT for one year with ASA/Brilinta. High intensity statin. Echo tomorrow. No beta blocker or Ace-inh today with soft BP. Will give Lasix 40 mg IV tonight. Resume home insulin therapy.   ECHOCARDIOGRAM COMPLETE  Result Date: 08/04/2021    ECHOCARDIOGRAM REPORT   Patient Name:   Jose Clark Date of Exam: 08/04/2021 Medical Rec #:  [id number]       Height:       71.0 in Accession #:  [id number]      Weight:       180.0 lb Date of Birth:  Oct 30, 1950        BSA:          2.016 m Patient Age:    4 years        BP:           121/77 mmHg Patient Gender: M               HR:           85 bpm. Exam Location:  Inpatient Procedure: 2D Echo Indications:    CAD  History:         Patient has no prior history of Echocardiogram examinations.                 Risk Factors:Diabetes.  Sonographer:    Arlyss Gandy Referring Phys: Mor[place]  1. Apical akinesis. Left ventricular ejection fraction, by estimation, is 30 to 35%. The left ventricle has moderately decreased function. The left ventricle demonstrates global hypokinesis. The left ventricular internal cavity size was mildly dilated. Left ventricular diastolic parameters are consistent with Grade I diastolic dysfunction (impaired relaxation).  2. Right ventricular systolic function is normal. The right ventricular size is normal.  3. Left atrial size was mild to moderately dilated.  4. Moderate functional mitral regurgitation. The mitral valve is degenerative.  5. The aortic valve is grossly normal. Aortic valve regurgitation is not visualized.  6. The inferior vena cava is normal in size with greater than 50% respiratory variability, suggesting right atrial pressure of 3 mmHg. Comparison(s): No prior Echocardiogram. Conclusion(s)/Recommendation(s): Consider repeat limited study with contrast to r/o LV thrombus. FINDINGS  Left Ventricle: Apical akinesis. Left ventricular ejection fraction, by estimation, is 30 to 35%. The left ventricle has moderately decreased function. The left ventricle demonstrates global hypokinesis. The left ventricular internal cavity size was mildly dilated. There is no left ventricular hypertrophy. Left ventricular diastolic parameters are consistent with Grade I diastolic dysfunction (impaired relaxation). Right Ventricle: The right ventricular size is normal. No increase in right ventricular wall thickness. Right ventricular systolic function is normal. Left Atrium: Left atrial size was mild to moderately dilated. Right Atrium: Right atrial size was normal in size. Pericardium: There is no evidence of pericardial effusion. Mitral Valve: Moderate functional mitral regurgitation. The  mitral valve is degenerative in appearance. Tricuspid Valve: The tricuspid valve is grossly normal. Tricuspid valve regurgitation is not demonstrated. Aortic Valve: The aortic valve is grossly normal. Aortic valve regurgitation is not visualized. Aortic valve mean gradient measures 3.0 mmHg. Aortic valve peak gradient measures 5.0 mmHg. Aortic valve area, by VTI measures 2.35 cm. Pulmonic Valve: The pulmonic valve was not well visualized. Pulmonic valve regurgitation is not visualized. Aorta: The aortic root and ascending aorta are structurally normal, with no evidence of dilitation. Venous: The inferior vena cava is normal in size with greater than 50% respiratory variability, suggesting right atrial pressure of 3 mmHg. IAS/Shunts: No atrial level shunt detected by color flow Doppler.  LEFT VENTRICLE PLAX 2D LVIDd:         5.90 cm      Diastology LVIDs:         4.90 cm      LV e' medial:    5.87 cm/s LV PW:         0.90 cm      LV E/e' medial:  14.9 LV IVS:  0.90 cm      LV e' lateral:   5.22 cm/s LVOT diam:     2.20 cm      LV E/e' lateral: 16.7 LV SV:         51 LV SV Index:   25 LVOT Area:     3.80 cm  LV Volumes (MOD) LV vol d, MOD A2C: 101.0 ml LV vol d, MOD A4C: 124.0 ml LV vol s, MOD A2C: 66.2 ml LV vol s, MOD A4C: 81.4 ml LV SV MOD A2C:     34.8 ml LV SV MOD A4C:     124.0 ml LV SV MOD BP:      37.4 ml RIGHT VENTRICLE RV Basal diam:  2.90 cm RV S prime:     12.70 cm/s TAPSE (M-mode): 1.7 cm LEFT ATRIUM             Index        RIGHT ATRIUM          Index LA diam:        3.40 cm 1.69 cm/m   RA Area:     9.14 cm LA Vol (A2C):   41.5 ml 20.58 ml/m  RA Volume:   15.20 ml 7.54 ml/m LA Vol (A4C):   40.1 ml 19.89 ml/m LA Biplane Vol: 41.2 ml 20.43 ml/m  AORTIC VALVE AV Area (Vmax):    2.41 cm AV Area (Vmean):   2.14 cm AV Area (VTI):     2.35 cm AV Vmax:           112.00 cm/s AV Vmean:          79.200 cm/s AV VTI:            0.215 m AV Peak Grad:      5.0 mmHg AV Mean Grad:      3.0 mmHg LVOT  Vmax:         70.90 cm/s LVOT Vmean:        44.600 cm/s LVOT VTI:          0.133 m LVOT/AV VTI ratio: 0.62  AORTA Ao Root diam: 3.00 cm Ao Asc diam:  3.10 cm MITRAL VALVE MV Area (PHT): 4.41 cm     SHUNTS MV Decel Time: 172 msec     Systemic VTI:  0.13 m MV E velocity: 87.30 cm/s   Systemic Diam: 2.20 cm MV A velocity: 108.00 cm/s MV E/A ratio:  0.81 Mary Scientist, physiological signed by Phineas Inches Signature Date/Time: 08/04/2021/4:27:20 PM    Final    Disposition   Pt is being discharged home today in good condition.  Follow-up Plans & Appointments     Discharge Instructions     Amb Referral to Cardiac Rehabilitation   Complete by: As directed    Diagnosis:  Coronary Stents PTCA STEMI     After initial evaluation and assessments completed: Virtual Based Care may be provided alone or in conjunction with Phase 2 Cardiac Rehab based on patient barriers.: Yes   Diet - low sodium heart healthy   Complete by: As directed    Discharge instructions   Complete by: As directed    No driving for 2 weeks. No lifting over 10 lbs for 4 weeks. No sexual activity for 4 weeks. You may not return to work until cleared by your cardiologist. Keep procedure site clean & dry. If you notice increased pain, swelling, bleeding or pus, call/return!  You may shower, but no soaking baths/hot tubs/pools for  1 week.   Increase activity slowly   Complete by: As directed        Discharge Medications   Allergies as of 08/06/2021   No Known Allergies      Medication List     STOP taking these medications    lisinopril 2.5 MG tablet Commonly known as: ZESTRIL       TAKE these medications    aspirin EC 81 MG tablet Take 1 tablet (81 mg total) by mouth daily. Swallow whole.   atorvastatin 80 MG tablet Commonly known as: LIPITOR Take 1 tablet (80 mg total) by mouth daily. Start taking on: August 07, 2021   furosemide 20 MG tablet Commonly known as: Lasix Take 1 tablet (20 mg total) by mouth  daily as needed for fluid or edema.   insulin glargine 100 unit/mL Sopn Commonly known as: LANTUS Inject 40 Units into the skin every morning.   insulin NPH-regular Human (70-30) 100 UNIT/ML injection Inject 10-15 Units into the skin daily as needed (CBG >180).   Jardiance 25 MG Tabs tablet Generic drug: empagliflozin 25 mg daily as needed (CBG >120).   losartan 25 MG tablet Commonly known as: COZAAR Take 0.5 tablets (12.5 mg total) by mouth daily.   metoprolol succinate 25 MG 24 hr tablet Commonly known as: TOPROL-XL Take 0.5 tablets (12.5 mg total) by mouth daily. Start taking on: August 07, 2021   nitroGLYCERIN 0.4 MG SL tablet Commonly known as: Nitrostat Place 1 tablet (0.4 mg total) under the tongue every 5 (five) minutes as needed.   Rybelsus 7 MG Tabs Generic drug: Semaglutide Take 1 tablet by mouth daily.   ticagrelor 90 MG Tabs tablet Commonly known as: BRILINTA Take 1 tablet (90 mg total) by mouth 2 (two) times daily.           Outstanding Labs/Studies   Consider OP f/u labs 6-8 weeks given statin initiation this admission.  BMP at follow up   Duration of Discharge Encounter   Greater than 30 minutes including physician time.  Signed, Leanor Kail, PA 08/06/2021, 10:08 AM  Agree with note by Robbie Lis PA-C  Mr. Cullers was admitted with anterior STEMI.  He underwent diagnostic coronary angiography revealing a high-grade mid LAD lesion which was stented successfully and a subtotally occluded RCA with left-to-right collaterals.  His LV function was significantly depressed at 30 to 35%.  He was placed on guideline directed optimal medical therapy.  He remains on dual antiplatelet therapy.  He denies chest pain or shortness of breath.  We have arranged outpatient follow-up in the advanced heart failure TOC clinic after which she will be seen back in [place] where he lives.  He is deemed stable for discharge.  Lorretta Harp, M.D., [place],  [facility], [place] [address] [place], [place]  [address]  [phone] 08/06/2021 12:41 PM

## 2021-08-06 NOTE — Progress Notes (Signed)
Heart Failure Nurse Navigator Progress Note  PCP: Celene Squibb, MD PCP-Cardiologist: none Admission Diagnosis: NSTEMI Admitted from: home with daughter  Presentation:   Jose Clark presented to [facility] with chest pain, transferred to [facility] hospital for [facility] with PCI. Pt lay in bed on room air, daughter at bedside. Patient interactive with interview process. Pt states he drives reliable vehicle. Endorses smoking 1/2PPD x 20 years--encouraged cessation. Pt states he quit upon admission to hospital and plans to stay quit. Will be established with cardiology at [facility] office. Pt states he does not have issues with medication costs.  Explained benefits of Heart & Vascular Transitions of Care Clinic appointment, patient agreeable.    ECHO/ LVEF: 30-35%, global hypokinesis, G1DD, mod MR.   Clinical Course:  Past Medical History:  Diagnosis Date   Diabetes ([place])    x 20 yrs   Diabetes mellitus    Hepatitis C      Social History   Socioeconomic History   Marital status: Single    Spouse name: Not on file   Number of children: 1   Years of education: Not on file   Highest education level: High school graduate  Occupational History   Occupation: retired  Tobacco Use   Smoking status: Former    Packs/day: 0.50    Years: 20.00    Pack years: 10.00    Types: Cigarettes    Quit date: 08/04/2020    Years since quitting: 1.0   Smokeless tobacco: Never   Tobacco comments:    Quit upon admission to hospital.  Vaping Use   Vaping Use: Never used  Substance and Sexual Activity   Alcohol use: Yes    Alcohol/week: 0.0 standard drinks    Comment: occassionally  does not drink everyday. Occasional beer.   Drug use: Not Currently    Comment: cocaine   Sexual activity: Not on file  Other Topics Concern   Not on file  Social History Narrative   Not on file   Social Determinants of Health   Financial Resource Strain: Low Risk    Difficulty of Paying Living Expenses: Not very  hard  Food Insecurity: No Food Insecurity   Worried About Running Out of Food in the Last Year: Never true   Ran Out of Food in the Last Year: Never true  Transportation Needs: No Transportation Needs   Lack of Transportation (Medical): No   Lack of Transportation (Non-Medical): No  Physical Activity: Not on file  Stress: Not on file  Social Connections: Not on file    High Risk Criteria for Readmission and/or Poor Patient Outcomes: Heart failure hospital admissions (last 6 months): 1  No Show rate: 26% Difficult social situation: no Demonstrates medication adherence: yes per statement Primary Language: English Literacy level: able to read/write and comprehend. Wears glasses.  Education Assessment and Provision:  Detailed education and instructions provided on heart failure disease management including the following:  Signs and symptoms of Heart Failure When to call the physician Importance of daily weights Low sodium diet Fluid restriction Medication management Anticipated future follow-up appointments  Patient education given on each of the above topics.  Patient acknowledges understanding via teach back method and acceptance of all instructions.  Education Materials:  "Living Better With Heart Failure" Booklet, HF zone tool, & Daily Weight Tracker Tool.  Patient has scale at home: yes Patient has pill box at home: yes   Barriers of Care:   -  Considerations/Referrals:   Referral made  to Heart Failure Pharmacist Stewardship: yes, appreciated Referral made to Heart Failure CSW/NCM TOC: yes, to see at [facility]  Referral made to Heart & Vascular TOC clinic: yes, 1/23 @ 11AM  Items for Follow-up on DC/TOC: -optimize -continue HF education -diet/fluid modification -smoking cessation   Pricilla Holm, MSN, RN Heart Failure Nurse Navigator [phone]

## 2021-08-06 NOTE — Progress Notes (Signed)
Inpatient Diabetes Program Recommendations  AACE/ADA: New Consensus Statement on Inpatient Glycemic Control (2015)  Target Ranges:  Prepandial:   less than 140 mg/dL      Peak postprandial:   less than 180 mg/dL (1-2 hours)      Critically ill patients:  140 - 180 mg/dL   Lab Results  Component Value Date   GLUCAP 70 08/06/2021   HGBA1C 7.8 (H) 08/05/2021    Review of Glycemic Control  Latest Reference Range & Units 08/05/21 08:02 08/05/21 08:18 08/05/21 11:39 08/05/21 17:12 08/05/21 21:37 08/06/21 07:55  Glucose-Capillary 70 - 99 mg/dL 65 (L) 84 110 (H) 141 (H) 155 (H) 70  (L): Data is abnormally low (H): Data is abnormally high Diabetes history: DM2 Outpatient Diabetes medications: Lantus 40 units QAM, 70/30 10-15 units daily if needed if glucose >180 mg/dl, Jardiance 25 mg daily if glucose >120 mg/dl, Rybelsus 7 mg daily (not taking Rybelsus 7 mg daily) Current orders for Inpatient glycemic control: Semglee 30 units QHS, Novolog 0-9 units TID with meals, Jardiance 25 mg daily  Inpatient Diabetes Program Recommendations:   Fasting CBG 70. Please consider: -Decrease Lantus to 25 units q hs  Thank you, Nani Gasser. Bibi Economos, RN, MSN, CDE  Diabetes Coordinator Inpatient Glycemic Control Team Team Pager [phone] (8am-5pm) 08/06/2021 10:43 AM

## 2021-08-08 ENCOUNTER — Other Ambulatory Visit (HOSPITAL_COMMUNITY): Payer: Self-pay

## 2021-08-11 NOTE — Progress Notes (Signed)
HEART & VASCULAR TRANSITION OF CARE CONSULT NOTE     Referring Physician:Dr Gwenlyn Found  Primary Care: Dr Nevada Crane Primary Cardiologist: Dr Gwenlyn Found   HPI: Referred to clinic by Dr Gwenlyn Found for heart failure consultation.   Jose Clark is a 71 year old with a history of HFrEF, ICM, DMI, CAD,tobacco abuse,  and CKD Stage IIIa.  Presented to [facility] with chest pain + shortness of breath in the setting anterior stemi. Transferred to [facility] for cath. Underwent cath with multivessel disease and required PTCA/DES to LAD ox RCA lesion is 99% stenosed. Plan for DAPT for 1 year and high intensify statin. Started on GDMT with Toprol XL, jardiance,  and losartan.   Overall feeling fine. Mild SOB walking in the grocery store.  Denies chest pain. Denies PND/Orthopnea. No chest pain. No bleeding issues. Appetite ok. No fever or chills. He does not have a scale.  Last smoked 08/03/21. Taking all medications. His daughter sets up his pill box.  Retired. Lives alone.   Cardiac Testing  07/2021 Echo EF30-35% RV normal Grade I DD  08/03/21 LHC/RHC    Cath showed prox-mid 99% LAD lesion as well as 100% mid-distal RCA non culprit lesion CTO with L-R collaterals. S/p PTCA/DES to LADox RCA lesion is 99% stenosed.   Mid RCA to Dist RCA lesion is 100% stenosed.   Ost Cx to Mid Cx lesion is 40% stenosed.   2nd Mrg lesion is 60% stenosed.   Dist Cx lesion is 50% stenosed. DAPT for one year with ASA/Brilinta. High intensity statin. Review of Systems: [y] = yes, [ ]  = no   General: Weight gain [ ] ; Weight loss [ ] ; Anorexia [ ] ; Fatigue [ ] ; Fever [ ] ; Chills [ ] ; Weakness [ ]   Cardiac: Chest pain/pressure [ ] ; Resting SOB [ ] ; Exertional SOB [Y ]; Orthopnea [ ] ; Pedal Edema [ ] ; Palpitations [ ] ; Syncope [ ] ; Presyncope [ ] ; Paroxysmal nocturnal dyspnea[ ]   Pulmonary: Cough [ ] ; Wheezing[ ] ; Hemoptysis[ ] ; Sputum [ ] ; Snoring [ ]   GI: Vomiting[ ] ; Dysphagia[ ] ; Melena[ ] ; Hematochezia [ ] ; Heartburn[ ] ; Abdominal pain [ ] ;  Constipation [ ] ; Diarrhea [ ] ; BRBPR [ ]   GU: Hematuria[ ] ; Dysuria [ ] ; Nocturia[ ]   Vascular: Pain in legs with walking [ ] ; Pain in feet with lying flat [ ] ; Non-healing sores [ ] ; Stroke [ ] ; TIA [ ] ; Slurred speech [ ] ;  Neuro: Headaches[ ] ; Vertigo[ ] ; Seizures[ ] ; Paresthesias[ ] ;Blurred vision [ ] ; Diplopia [ ] ; Vision changes [ ]   Ortho/Skin: Arthritis [ ] ; Joint pain [ ] ; Muscle pain [ ] ; Joint swelling [ ] ; Back Pain [ ] ; Rash [ ]   Psych: Depression[ ] ; Anxiety[ ]   Heme: Bleeding problems [ ] ; Clotting disorders [ ] ; Anemia [ ]   Endocrine: Diabetes [Y ]; Thyroid dysfunction[ ]    Past Medical History:  Diagnosis Date   Diabetes ([place])    x 20 yrs   Diabetes mellitus    Hepatitis C     Current Outpatient Medications  Medication Sig Dispense Refill   aspirin EC 81 MG tablet Take 1 tablet (81 mg total) by mouth daily. Swallow whole. 90 tablet 3   atorvastatin (LIPITOR) 80 MG tablet Take 1 tablet (80 mg total) by mouth daily. 90 tablet 3   empagliflozin (JARDIANCE) 25 MG TABS tablet Take 25 mg by mouth daily.     furosemide (LASIX) 20 MG tablet Take 1 tablet (20 mg total) by mouth  daily as needed for fluid or edema. 30 tablet 2   insulin glargine (LANTUS) 100 unit/mL SOPN Inject 40 Units into the skin every morning.     insulin NPH-insulin regular (NOVOLIN 70/30) (70-30) 100 UNIT/ML injection Inject 10-15 Units into the skin daily as needed (CBG >180).     losartan (COZAAR) 25 MG tablet Take 0.5 tablets (12.5 mg total) by mouth daily. 30 tablet 6   metoprolol succinate (TOPROL-XL) 25 MG 24 hr tablet Take 0.5 tablets (12.5 mg total) by mouth daily. 30 tablet 6   nitroGLYCERIN (NITROSTAT) 0.4 MG SL tablet Place 1 tablet (0.4 mg total) under the tongue every 5 (five) minutes as needed. 25 tablet 12   ticagrelor (BRILINTA) 90 MG TABS tablet Take 1 tablet (90 mg total) by mouth 2 (two) times daily. 180 tablet 3   No current facility-administered medications for this encounter.     No Known Allergies    Social History   Socioeconomic History   Marital status: Single    Spouse name: Not on file   Number of children: 1   Years of education: Not on file   Highest education level: High school graduate  Occupational History   Occupation: retired  Tobacco Use   Smoking status: Former    Packs/day: 0.50    Years: 20.00    Pack years: 10.00    Types: Cigarettes    Quit date: 08/04/2020    Years since quitting: 1.0   Smokeless tobacco: Never   Tobacco comments:    Quit upon admission to hospital.  Vaping Use   Vaping Use: Never used  Substance and Sexual Activity   Alcohol use: Yes    Alcohol/week: 0.0 standard drinks    Comment: occassionally  does not drink everyday. Occasional beer.   Drug use: Not Currently    Comment: cocaine   Sexual activity: Not on file  Other Topics Concern   Not on file  Social History Narrative   Not on file   Social Determinants of Health   Financial Resource Strain: Low Risk    Difficulty of Paying Living Expenses: Not very hard  Food Insecurity: No Food Insecurity   Worried About Running Out of Food in the Last Year: Never true   Ran Out of Food in the Last Year: Never true  Transportation Needs: No Transportation Needs   Lack of Transportation (Medical): No   Lack of Transportation (Non-Medical): No  Physical Activity: Not on file  Stress: Not on file  Social Connections: Not on file  Intimate Partner Violence: Not on file      Family History  Problem Relation Age of Onset   Dementia Mother    CVA Father     Vitals:   08/12/21 1114  BP: 130/80  Pulse: 87  SpO2: 98%  Weight: 83.6 kg   Wt Readings from Last 3 Encounters:  08/12/21 83.6 kg (184 lb 3.2 oz)  08/03/21 81.6 kg (180 lb)  08/21/20 81.6 kg (180 lb)    Reds Clip 35%  PHYSICAL EXAM: General:  Well appearing. No respiratory difficulty HEENT: normal Neck: supple. no JVD. Carotids 2+ bilat; no bruits. No lymphadenopathy or thryomegaly  appreciated. Cor: PMI nondisplaced. Regular rate & rhythm. No rubs, gallops or murmurs. Lungs: clear Abdomen: soft, nontender, nondistended. No hepatosplenomegaly. No bruits or masses. Good bowel sounds. Extremities: no cyanosis, clubbing, rash, edema Neuro: alert & oriented x 3, cranial nerves grossly intact. moves all 4 extremities w/o difficulty. Affect pleasant.  ECG:SR  81 bpm ST elevated V2-V3 with ST depression V4 -V5. Reviewed and discussed with Dr Aundra Dubin. Secondary  to recent STEMI evolution. No chest pain.    ASSESSMENT & PLAN: HFrEF,ICM 07/2021 Echo EF 30-35% . Plan to repeat Echo in 3 months after HF meds optimized.  -NYHA III.  Volume status stable. Reds Clip 35%, stable.  - Does not need diuretics.  - Continue Toprol XL 12.5 mg daily  - Stop losarta . Start entresto 24-26 mg daily.  - Consider MRA next visit.  - Continue jardiance 10 mg daily  - Discussed low salt diet, daily weights, and purpose of new medication.  - He was given scale today with weight chart.   2.CAD 08/03/21 LHC prox-mid 99% LAD lesion as well as 100% mid-distal RCA non culprit lesion CTO with L-R collaterals. S/P PTCA/DES to LAD Plan DAPT x 1 year and high intensity statin. - no  chest pain -He has been referred to [facility] CRPII.   3.DMII Hgb A1C 7.8  Followed by PCP, Dr Nevada Crane   4. Tobacco Abuse Last smoked 08/03/21   5. CKD Stage IIIa GFR 54.  Asked to avoid NSAIDS.   Check CBC  and BMET   Referred to HFSW (PCP, Medications, Transportation, ETOH Abuse, Drug Abuse, Insurance, Financial ): No Refer to Pharmacy: Yes  Refer to Home Health:  No Refer to Advanced Heart Failure Clinic: Yes --> Dr Aundra Dubin  Refer to General Cardiology: Shared   Follow up  2 weeks with pharmacy and 6 weeks with Dr Aundra Dubin.   Jose Paden NP-C  12:24 PM

## 2021-08-12 ENCOUNTER — Telehealth (HOSPITAL_COMMUNITY): Payer: Self-pay

## 2021-08-12 ENCOUNTER — Other Ambulatory Visit (HOSPITAL_COMMUNITY): Payer: Self-pay

## 2021-08-12 ENCOUNTER — Other Ambulatory Visit: Payer: Self-pay

## 2021-08-12 ENCOUNTER — Ambulatory Visit (HOSPITAL_COMMUNITY)
Admit: 2021-08-12 | Discharge: 2021-08-12 | Disposition: A | Discharge status: Home / Self Care | Payer: Medicare HMO | Attending: Adult Health | Admitting: Adult Health

## 2021-08-12 ENCOUNTER — Encounter (HOSPITAL_COMMUNITY): Payer: Self-pay

## 2021-08-12 VITALS — BP 130/80 | HR 87 | Wt 184.2 lb

## 2021-08-12 DIAGNOSIS — Z7982 Long term (current) use of aspirin: Secondary | ICD-10-CM | POA: Diagnosis not present

## 2021-08-12 DIAGNOSIS — Z79899 Other long term (current) drug therapy: Secondary | ICD-10-CM | POA: Insufficient documentation

## 2021-08-12 DIAGNOSIS — I491 Atrial premature depolarization: Secondary | ICD-10-CM | POA: Diagnosis not present

## 2021-08-12 DIAGNOSIS — I252 Old myocardial infarction: Secondary | ICD-10-CM | POA: Insufficient documentation

## 2021-08-12 DIAGNOSIS — Z794 Long term (current) use of insulin: Secondary | ICD-10-CM | POA: Insufficient documentation

## 2021-08-12 DIAGNOSIS — N1831 Chronic kidney disease, stage 3a: Secondary | ICD-10-CM

## 2021-08-12 DIAGNOSIS — I2510 Atherosclerotic heart disease of native coronary artery without angina pectoris: Secondary | ICD-10-CM | POA: Insufficient documentation

## 2021-08-12 DIAGNOSIS — E1122 Type 2 diabetes mellitus with diabetic chronic kidney disease: Secondary | ICD-10-CM | POA: Insufficient documentation

## 2021-08-12 DIAGNOSIS — F17200 Nicotine dependence, unspecified, uncomplicated: Secondary | ICD-10-CM | POA: Diagnosis not present

## 2021-08-12 DIAGNOSIS — Z720 Tobacco use: Secondary | ICD-10-CM | POA: Diagnosis not present

## 2021-08-12 DIAGNOSIS — Z7984 Long term (current) use of oral hypoglycemic drugs: Secondary | ICD-10-CM | POA: Diagnosis not present

## 2021-08-12 DIAGNOSIS — Z955 Presence of coronary angioplasty implant and graft: Secondary | ICD-10-CM | POA: Diagnosis not present

## 2021-08-12 DIAGNOSIS — I509 Heart failure, unspecified: Secondary | ICD-10-CM | POA: Diagnosis not present

## 2021-08-12 DIAGNOSIS — I5022 Chronic systolic (congestive) heart failure: Secondary | ICD-10-CM | POA: Diagnosis not present

## 2021-08-12 LAB — BASIC METABOLIC PANEL
Anion gap: 9 (ref 5–15)
BUN: 21 mg/dL (ref 8–23)
CO2: 22 mmol/L (ref 22–32)
Calcium: 8.9 mg/dL (ref 8.9–10.3)
Chloride: 109 mmol/L (ref 98–111)
Creatinine, Ser: 1.33 mg/dL — ABNORMAL HIGH (ref 0.61–1.24)
GFR, Estimated: 58 mL/min — ABNORMAL LOW (ref >60)
Glucose, Bld: 201 mg/dL — ABNORMAL HIGH (ref 70–99)
Potassium: 4.5 mmol/L (ref 3.5–5.1)
Sodium: 140 mmol/L (ref 135–145)

## 2021-08-12 LAB — CBC
HCT: 43.8 % (ref 39.0–52.0)
Hemoglobin: 15.4 g/dL (ref 13.0–17.0)
MCH: 32.2 pg (ref 26.0–34.0)
MCHC: 35.2 g/dL (ref 30.0–36.0)
MCV: 91.4 fL (ref 80.0–100.0)
Platelets: 261 10*3/uL (ref 150–400)
RBC: 4.79 MIL/uL (ref 4.22–5.81)
RDW: 12.1 % (ref 11.5–15.5)
WBC: 7.5 10*3/uL (ref 4.0–10.5)
nRBC: 0 % (ref 0.0–0.2)

## 2021-08-12 MED ORDER — ENTRESTO 24-26 MG PO TABS: 1.0000 | ORAL_TABLET | Freq: Two times a day (BID) | ORAL | 2 refills | Status: DC

## 2021-08-12 NOTE — Patient Instructions (Signed)
STOP Losartan  Start Entresto 24/26 mg Twice daily Start Tomorrow Tuesday 24    Lab work done today,we will call you with any abnormal results  Please follow up with our heart failure pharmacist in 2 weeks  Your physician recommends that you schedule a follow-up appointment in: 6 weeks with Dr. Aundra Dubin  If you have any questions or concerns before your next appointment please send [place] a message through [place] or call our office at [phone].    TO LEAVE A MESSAGE FOR THE NURSE SELECT OPTION 2, PLEASE LEAVE A MESSAGE INCLUDING: YOUR NAME DATE OF BIRTH CALL BACK NUMBER REASON FOR CALL**this is important as we prioritize the call backs  YOU WILL RECEIVE A CALL BACK THE SAME DAY AS LONG AS YOU CALL BEFORE 4:00 PM At the [facility], you and your health needs are our priority. As part of our continuing mission to provide you with exceptional heart care, we have created designated Provider Care Teams. These Care Teams include your primary Cardiologist (physician) and Advanced Practice Providers (APPs- Physician Assistants and Nurse Practitioners) who all work together to provide you with the care you need, when you need it.   You may see any of the following providers on your designated Care Team at your next follow up: Dr Glori Bickers Dr Haynes Kerns, NP Lyda Jester, [place] [facility] [place], [place] Audry Riles, PharmD   Please be sure to bring in all your medications bottles to every appointment.  Do the following things EVERYDAY: Weigh yourself in the morning before breakfast. Write it down and keep it in a log. Take your medicines as prescribed Eat low salt foods--Limit salt (sodium) to 2000 mg per day.  Stay as active as you can everyday Limit all fluids for the day to less than 2 liters

## 2021-08-12 NOTE — Progress Notes (Signed)
EKG CRITICAL VALUE     12 lead EKG performed.  Critical value noted.  Riki Rusk, RN notified.   Warren Lacy, CCT 08/12/2021 11:40 AM

## 2021-08-12 NOTE — Progress Notes (Signed)
CSW consulted to see pt regarding smoking cessation.  Pt reports he smoked about 1/2pack per week and only while drinking.  Stopped during hospital stay and hasn't smoked since DC- has no plans on starting to smoke again and doesn't feel as if he has an addiction so not worried about relapse.  Denies need for resources at this time- no other needs reported  Jorge Ny, [facility] Desk#: [phone] Cell#: [phone]

## 2021-08-12 NOTE — Addendum Note (Signed)
Encounter addended by: Jorge Ny, LCSW on: 08/12/2021 1:01 PM  Actions taken: Clinical Note Signed

## 2021-08-12 NOTE — Telephone Encounter (Signed)
Call attempted to confirm HV TOC appt today 1/23 @ 11AM. HIPPA appropriate VM left with callback number.   Pricilla Holm, MSN, RN Heart Failure Nurse Navigator [phone]

## 2021-08-12 NOTE — Progress Notes (Signed)
ReDS Vest / Clip - 08/12/21 1114       ReDS Vest / Clip   Station Marker C    Ruler Value 31    ReDS Value Range Low volume    ReDS Actual Value 35

## 2021-08-24 NOTE — Progress Notes (Incomplete)
***In Progress***    Advanced Heart Failure Clinic Note  Referring Physician:Dr Berry  Primary Care: Dr Nevada Crane Primary Cardiologist: Dr Gwenlyn Found   HPI:  Referred to clinic by Dr Gwenlyn Found for heart failure consultation.    Jose Clark is a 71 year old with a history of HFrEF, ICM, DMI, CAD, tobacco use, and CKD Stage IIIa.   Presented to [facility] with chest pain + shortness of breath in the setting anterior stemi. Transferred to [facility] for cath. Underwent cath with multivessel disease and required PTCA/DES to LAD ox RCA lesion is 99% stenosed. Plan for DAPT for 1 year and high intensify statin. Started on GDMT with Toprol XL, jardiance, and losartan.   Presented to HF clinic on 08/12/2021 and had mild SOB walking in the grocery store.  Denied chest pain, PND/Orthopnea or bleeding issues Last smoked 08/03/21. He was taking all his medications and his daughter set up his pill box. Retired. Lives alone.  Today he returns to HF clinic for pharmacist medication titration. At last visit with NP he was given at scale for home use and started on Entresto 24/26 mg BID .   Overall feeling ***. Dizziness, lightheadedness, fatigue:  Chest pain or palpitations:  How is your breathing?: *** SOB: Able to complete all ADLs. Activity level ***  Weight at home pounds. Takes furosemide/torsemide/bumex *** mg *** daily.  LEE PND/Orthopnea  Appetite *** Low-salt diet:   Physical Exam Cost/affordability of meds   HF Medications: Metoprolol succinate 12.5 mg daily Entresto 24/26 mg BID Jardiance 25 mg daily  Has the patient been experiencing any side effects to the medications prescribed?  {YES NO:22349}  Does the patient have any problems obtaining medications due to transportation or finances?   {YES NO:22349}  Understanding of regimen: {excellent/good/fair/poor:19665} Understanding of indications: {excellent/good/fair/poor:19665} Potential of compliance: {excellent/good/fair/poor:19665} Patient understands  to avoid NSAIDs. Patient understands to avoid decongestants.   Cardiac Testing  07/2021 Echo EF30-35% RV normal Grade I DD   08/03/21 LHC/RHC    Cath showed prox-mid 99% LAD lesion as well as 100% mid-distal RCA non culprit lesion CTO with L-R collaterals. S/p PTCA/DES to LADox RCA lesion is 99% stenosed.   Mid RCA to Dist RCA lesion is 100% stenosed.   Ost Cx to Mid Cx lesion is 40% stenosed.   2nd Mrg lesion is 60% stenosed.   Dist Cx lesion is 50% stenosed. DAPT for one year with ASA/Brilinta. High intensity statin.  Plan- no labs today ?BNP A- spiro 12.5 mg daily, labs 1 week B- Entresto to 49/51, labs 2 weeks C- metoprolol to 25 mg BID F/u 1 week and then additional med titration before Dr. Aundra Dubin  Pertinent Lab Values: Labs 08/12/21: Serum creatinine 1.33, BUN 21, Potassium 4.5, Sodium 140  Vital Signs: Weight: *** (last clinic weight: 184.2 lbs) Blood pressure: *** 130/80 Heart rate: *** 87  Assessment/Plan: 1. Chronic systolic CHF (EF ***), due to ***. NYHA class *** symptoms. HFrEF, ICM 07/2021 Echo EF 30-35% . Plan to repeat Echo in 3 months after HF meds optimized.  -NYHA III.  Volume status stable. Reds Clip 35%, stable.  - Does not need diuretics.  - Continue Toprol XL 12.5 mg daily  - Continue  entresto 24-26 mg daily - Consider MRA next visit.  - Continue jardiance 25 mg daily  - Discussed low salt diet, daily weights, and purpose of new medication.   2.CAD 08/03/21 LHC prox-mid 99% LAD lesion as well as 100% mid-distal RCA non culprit lesion CTO  with L-R collaterals. S/P PTCA/DES to LAD Plan DAPT x 1 year and high intensity statin. - no  chest pain -He has been referred to [facility] CRPII.    3.DMII Hgb A1C 7.8  Followed by PCP, Dr Nevada Crane    4. Tobacco Abuse Last smoked 08/03/21    5. CKD Stage IIIa GFR 54.  Asked to avoid NSAIDS.    Follow up ***  4 weeks with Dr Aundra Dubin.   Audry Riles, PharmD, BCPS, BCCP, CPP Heart Failure Clinic  Pharmacist [phone]

## 2021-08-26 ENCOUNTER — Ambulatory Visit (HOSPITAL_COMMUNITY)
Admission: RE | Admit: 2021-08-26 | Discharge: 2021-08-26 | Disposition: A | Discharge status: Home / Self Care | Payer: Medicare Other | Source: Ambulatory Visit | Attending: Internal Medicine | Admitting: Internal Medicine

## 2021-08-26 ENCOUNTER — Other Ambulatory Visit: Payer: Self-pay

## 2021-08-26 VITALS — BP 132/86 | HR 85 | Wt 184.2 lb

## 2021-08-26 DIAGNOSIS — Z7901 Long term (current) use of anticoagulants: Secondary | ICD-10-CM | POA: Insufficient documentation

## 2021-08-26 DIAGNOSIS — Z7982 Long term (current) use of aspirin: Secondary | ICD-10-CM | POA: Insufficient documentation

## 2021-08-26 DIAGNOSIS — Z79899 Other long term (current) drug therapy: Secondary | ICD-10-CM | POA: Insufficient documentation

## 2021-08-26 DIAGNOSIS — E1122 Type 2 diabetes mellitus with diabetic chronic kidney disease: Secondary | ICD-10-CM | POA: Insufficient documentation

## 2021-08-26 DIAGNOSIS — I5022 Chronic systolic (congestive) heart failure: Secondary | ICD-10-CM | POA: Diagnosis not present

## 2021-08-26 DIAGNOSIS — Z7984 Long term (current) use of oral hypoglycemic drugs: Secondary | ICD-10-CM | POA: Diagnosis not present

## 2021-08-26 DIAGNOSIS — I2510 Atherosclerotic heart disease of native coronary artery without angina pectoris: Secondary | ICD-10-CM | POA: Insufficient documentation

## 2021-08-26 DIAGNOSIS — N1831 Chronic kidney disease, stage 3a: Secondary | ICD-10-CM | POA: Insufficient documentation

## 2021-08-26 DIAGNOSIS — I509 Heart failure, unspecified: Secondary | ICD-10-CM

## 2021-08-26 DIAGNOSIS — Z87891 Personal history of nicotine dependence: Secondary | ICD-10-CM | POA: Diagnosis not present

## 2021-08-26 LAB — BASIC METABOLIC PANEL
Anion gap: 10 (ref 5–15)
BUN: 25 mg/dL — ABNORMAL HIGH (ref 8–23)
CO2: 24 mmol/L (ref 22–32)
Calcium: 9.1 mg/dL (ref 8.9–10.3)
Chloride: 104 mmol/L (ref 98–111)
Creatinine, Ser: 1.43 mg/dL — ABNORMAL HIGH (ref 0.61–1.24)
GFR, Estimated: 53 mL/min — ABNORMAL LOW (ref >60)
Glucose, Bld: 129 mg/dL — ABNORMAL HIGH (ref 70–99)
Potassium: 4.7 mmol/L (ref 3.5–5.1)
Sodium: 138 mmol/L (ref 135–145)

## 2021-08-26 MED ORDER — ENTRESTO 49-51 MG PO TABS: 1.0000 | ORAL_TABLET | Freq: Two times a day (BID) | ORAL | 3 refills | Status: DC

## 2021-08-26 NOTE — Progress Notes (Signed)
Advanced Heart Failure Clinic Note   Referring Physician:Dr. Gwenlyn Found  Primary Care: Dr. Nevada Crane Primary Cardiologist: Dr. Gwenlyn Found  Heart failure Cardiologist: Dr. Aundra Dubin  HPI:  Referred to clinic by Dr Gwenlyn Found for heart failure consultation.    Jose Clark is a 71 year old with a history of HFrEF, ICM, TDMII, CAD, tobacco use, and CKD Stage IIIa.   He presented to [facility] with chest pain + shortness of breath in the setting anterior STEMI. Transferred to [facility] for cath. Underwent cath with multivessel disease and required PTCA/DES to LAD ox RCA lesion is 99% stenosed. Plan for DAPT for 1 year and high intensity statin. Started on GDMT with metoprolol XL, Jardiance, and losartan.   Presented to HF clinic on 08/12/2021 and had mild SOB walking in the grocery store.  Denied chest pain, PND/Orthopnea or bleeding issues. Last smoked 08/03/21. He was taking all his medications and his daughter sets up his pill box. He is retired and lives alone.  Today he returns to HF clinic for pharmacist medication titration. At last visit with NP, losartan was discontinued and Entresto 24/26 mg BID was initiated. Overall he is feeling well. He denies dizziness, lightheadedness unless his blood sugars are low. No fatigue. No chest pain or palpitations. He has been walking a mile everyday to try and stay active and does not feel short of breath. He weighs himself at home daily and weights have been stable ranging from 177-181 lbs. He is unsure how he has been taking his furosemide as his daughter fills his pillbox and he thinks he may have been taking it daily instead of as needed. No LEE, no PND, or orthopnea. He brought in all his medications today and I noted that he did not have any baby aspirin, which the patient does not think he has been taking.   His appetite is good overall, however he tells me that he has been eating chicken lunch meat and other meals that are high in sodium, so I educated him on adhering to a low-salt  diet.  HF Medications: Metoprolol succinate 12.5 mg daily Entresto 24/26 mg BID Jardiance 25 mg daily Furosemide 20 mg PRN weight gain/edema - patient thinks he has been taking daily instead of PRN  Has the patient been experiencing any side effects to the medications prescribed? NO  Does the patient have any problems obtaining medications due to transportation or finances? Has [employer] Medicare and Medicaid  Understanding of regimen: fair Understanding of indications: fair Potential of compliance: good Patient understands to avoid NSAIDs. Patient understands to avoid decongestants.   Cardiac Testing  07/2021 Echo EF30-35% RV normal Grade I DD   08/03/21 LHC/RHC    Cath showed prox-mid 99% LAD lesion as well as 100% mid-distal RCA non culprit lesion CTO with L-R collaterals. S/p PTCA/DES to LADox RCA lesion is 99% stenosed.   Mid RCA to Dist RCA lesion is 100% stenosed.   Ost Cx to Mid Cx lesion is 40% stenosed.   2nd Mrg lesion is 60% stenosed.   Dist Cx lesion is 50% stenosed. DAPT for one year with ASA/Brilinta. High intensity statin.  Pertinent Lab Values: Labs 08/12/21: Serum creatinine 1.33, BUN 21, Potassium 4.5, Sodium 140 Labs: 08/26/2021: BMET pending  Vital Signs: Weight: 184.2 lbs (last clinic weight: 184.2 lbs) Blood pressure: 132/86 mmHg Heart rate: 85 bpm  Assessment/Plan: HFrEF, ICM 07/2021 Echo EF 30-35% . Plan to repeat Echo in 3 months after HF meds optimized.  -NYHA III.  Euvolemic on exam.  - Decrease furosemide to 20 mg as needed for fluid retention - Continue metoprolol XL 12.5 mg daily  - Increase Entresto to 49-51 mg daily. Repeat BMET in 4 weeks. - Consider MRA next visit.  - Continue Jardiance 25 mg daily  - Discussed low salt diet, daily weights, and purpose of new medication.   2.CAD 08/03/21 LHC prox-mid 99% LAD lesion as well as 100% mid-distal RCA non culprit lesion CTO with L-R collaterals. S/P PTCA/DES to LAD Plan DAPT x 1 year and high  intensity statin. - no  chest pain -He has been referred to [facility] CRPII.  -Start aspirin 81 mg daily- educated on importance of aspirin and Brilinta   3.DMII Hgb A1C 7.8  Followed by PCP, Dr Nevada Crane    4. Tobacco Abuse Last smoked 08/03/21    5. CKD Stage IIIa BMET today pending   Follow up in 4 weeks with Dr Aundra Dubin.   Audry Riles, PharmD, BCPS, BCCP, CPP Heart Failure Clinic Pharmacist [phone]

## 2021-08-26 NOTE — Patient Instructions (Addendum)
It was a pleasure seeing you today!  MEDICATIONS: -We are changing your medications today -Increase Entresto to 49/51 mg twice daily  -Take your furosemide 20 mg as needed for weight gain (3lbs in 1 day or 5 lbs in a week) -Start taking aspirin 81 mg daily (can get this over the counter) -Call if you have questions about your medications.  LABS: -We will call you if your labs need attention.  NEXT APPOINTMENT: Return to clinic in 4 weeks with Dr. Aundra Dubin.  In general, to take care of your heart failure: -Limit your fluid intake to 2 Liters (half-gallon) per day.   -Limit your salt intake to ideally 2-3 grams (2000-3000 mg) per day. -Weigh yourself daily and record, and bring that "weight diary" to your next appointment.  (Weight gain of 2-3 pounds in 1 day typically means fluid weight.) -The medications for your heart are to help your heart and help you live longer.   -Please contact us before stopping any of your heart medications.  Call the clinic at [phone] with questions or to reschedule future appointments.

## 2021-08-27 ENCOUNTER — Encounter (HOSPITAL_COMMUNITY): Payer: Medicare Other

## 2021-08-30 ENCOUNTER — Ambulatory Visit: Payer: Medicare HMO | Admitting: Podiatry

## 2021-09-09 DIAGNOSIS — I10 Essential (primary) hypertension: Secondary | ICD-10-CM | POA: Diagnosis not present

## 2021-09-09 DIAGNOSIS — E1165 Type 2 diabetes mellitus with hyperglycemia: Secondary | ICD-10-CM | POA: Diagnosis not present

## 2021-09-10 ENCOUNTER — Ambulatory Visit (INDEPENDENT_AMBULATORY_CARE_PROVIDER_SITE_OTHER): Payer: Medicare Other | Admitting: Podiatry

## 2021-09-10 ENCOUNTER — Other Ambulatory Visit: Payer: Self-pay

## 2021-09-10 ENCOUNTER — Encounter: Payer: Self-pay | Admitting: Podiatry

## 2021-09-10 DIAGNOSIS — B351 Tinea unguium: Secondary | ICD-10-CM | POA: Diagnosis not present

## 2021-09-10 DIAGNOSIS — E0843 Diabetes mellitus due to underlying condition with diabetic autonomic (poly)neuropathy: Secondary | ICD-10-CM

## 2021-09-10 DIAGNOSIS — M79675 Pain in left toe(s): Secondary | ICD-10-CM

## 2021-09-10 DIAGNOSIS — M79674 Pain in right toe(s): Secondary | ICD-10-CM | POA: Diagnosis not present

## 2021-09-10 DIAGNOSIS — L989 Disorder of the skin and subcutaneous tissue, unspecified: Secondary | ICD-10-CM | POA: Diagnosis not present

## 2021-09-10 NOTE — Progress Notes (Signed)
° °  SUBJECTIVE Patient with a history of diabetes mellitus presents to office today complaining of elongated, thickened nails that cause pain while ambulating in shoes.  Patient is unable to trim their own nails. Patient is here for further evaluation and treatment.   Past Medical History:  Diagnosis Date   Diabetes ([place])    x 20 yrs   Diabetes mellitus    Hepatitis C     OBJECTIVE General Patient is awake, alert, and oriented x 3 and in no acute distress. Derm Skin is dry and supple bilateral. Negative open lesions or macerations. Remaining integument unremarkable. Nails are tender, long, thickened and dystrophic with subungual debris, consistent with onychomycosis, 1-5 bilateral. No signs of infection noted.  Symptomatic hyperkeratotic callus lesions also noted to the bilateral feet Vasc  DP and PT pedal pulses palpable bilaterally. Temperature gradient within normal limits.  Neuro Epicritic and protective threshold sensation diminished bilaterally.  Musculoskeletal Exam No symptomatic pedal deformities noted bilateral. Muscular strength within normal limits.  ASSESSMENT 1. Diabetes Mellitus w/ peripheral neuropathy 2.  Pain due to onychomycosis of toenails bilateral 3.  Preulcerative callus lesions bilateral feet  PLAN OF CARE 1. Patient evaluated today. 2. Instructed to maintain good pedal hygiene and foot care. Stressed importance of controlling blood sugar.  3. Mechanical debridement of nails 1-5 bilaterally performed using a nail nipper. Filed with dremel without incident.  4.  Excisional debridement of the hyperkeratotic preulcerative callus lesions was performed using a 312 scalpel without incident or bleeding.   5.  Return to clinic in 3 mos.     Edrick Kins, DPM [facility]  Dr. Edrick Kins, DPM    [address] [place], [place] [address]                Office [phone]  Fax [phone]

## 2021-09-13 ENCOUNTER — Encounter: Payer: Self-pay | Admitting: Physician Assistant

## 2021-09-13 DIAGNOSIS — I10 Essential (primary) hypertension: Secondary | ICD-10-CM | POA: Diagnosis not present

## 2021-09-13 DIAGNOSIS — E870 Hyperosmolality and hypernatremia: Secondary | ICD-10-CM | POA: Diagnosis not present

## 2021-09-13 DIAGNOSIS — R809 Proteinuria, unspecified: Secondary | ICD-10-CM | POA: Diagnosis not present

## 2021-09-13 DIAGNOSIS — R7401 Elevation of levels of liver transaminase levels: Secondary | ICD-10-CM | POA: Diagnosis not present

## 2021-09-13 DIAGNOSIS — E785 Hyperlipidemia, unspecified: Secondary | ICD-10-CM | POA: Diagnosis not present

## 2021-09-13 DIAGNOSIS — E1165 Type 2 diabetes mellitus with hyperglycemia: Secondary | ICD-10-CM | POA: Diagnosis not present

## 2021-09-13 DIAGNOSIS — N1831 Chronic kidney disease, stage 3a: Secondary | ICD-10-CM | POA: Diagnosis not present

## 2021-09-13 DIAGNOSIS — Z955 Presence of coronary angioplasty implant and graft: Secondary | ICD-10-CM | POA: Diagnosis not present

## 2021-09-13 DIAGNOSIS — R931 Abnormal findings on diagnostic imaging of heart and coronary circulation: Secondary | ICD-10-CM | POA: Diagnosis not present

## 2021-09-13 DIAGNOSIS — I252 Old myocardial infarction: Secondary | ICD-10-CM | POA: Diagnosis not present

## 2021-09-13 NOTE — Progress Notes (Addendum)
Cardiology Office Note    Date:  09/17/2021   ID:  Jose Clark, Jose Clark 1950/09/05, MRN [id number]  PCP:  Celene Squibb, MD  Cardiologist:  Recently seen by Dr. Angelena Form upon presentation, will need Veronia Beets MD. Also being seen by HF team. Electrophysiologist:  None   Chief Complaint: f/u CAD/MI  History of Present Illness:   Jose Clark is a 71 y.o. male with history of DM, tobacco abuse, recently diagnosed CAD/anterior STEMI and ICM/chronic combined CHF, moderate functional MR, hepatitis C, CKD 3a by labs who presents for follow-up. He was admitted 07/2021 with MI and underwent cath as outlined below with DES to mLAD, residual disease as outlined treated medically (including 100% RCA nonculprit CTO with L-R collaterals). Standard post-MI therapy and GDMT initiated. Echo 08/04/21 EF 30-35%, apical AK, gobal HK, grade 1 DD, mild-mod LAE, moderate functional MR. He followed up with the HF [facility] clinic for med titration. At last pharm visit, Entresto increased, Lasix changed to 20mg  PRN, restarted on ASA (had not been taking), and continued on Brilinta amongst other meds as outlined. Has f/u with Dr. Aundra Dubin 09/24/21.  He is seen back for follow-up feeling well. He states he feels like nothing ever happened. No CP, SOB, edema, orthopnea, PND. Has been tracking weight without recent changes. We went over medications and he affirms compliance. He is originally from [place] and still hoping we get some snow this year!  Labwork independently reviewed: 08/2021 K 4.7, Cr 1.43, glu 129 07/2021 CBC wnl, LDL 110, trig 96, A1C 7.8, albumin 3.2, Tprot 6.3, ASTL/ALT OK   Cardiology Studies:   Studies reviewed are outlined and summarized above. Reports included below if pertinent.   2d echo 08/04/21  1. Apical akinesis. Left ventricular ejection fraction, by estimation, is  30 to 35%. The left ventricle has moderately decreased function. The left  ventricle demonstrates global hypokinesis. The left  ventricular internal  cavity size was mildly dilated.  Left ventricular diastolic parameters are consistent with Grade I  diastolic dysfunction (impaired relaxation).   2. Right ventricular systolic function is normal. The right ventricular  size is normal.   3. Left atrial size was mild to moderately dilated.   4. Moderate functional mitral regurgitation. The mitral valve is  degenerative.   5. The aortic valve is grossly normal. Aortic valve regurgitation is not  visualized.   6. The inferior vena cava is normal in size with greater than 50%  respiratory variability, suggesting right atrial pressure of 3 mmHg.   Comparison(s): No prior Echocardiogram.   Cath 08/03/21   Prox RCA lesion is 99% stenosed.   Mid RCA to Dist RCA lesion is 100% stenosed.   Ost Cx to Mid Cx lesion is 40% stenosed.   2nd Mrg lesion is 60% stenosed.   Dist Cx lesion is 50% stenosed.   Prox LAD to Mid LAD lesion is 99% stenosed.   Mid LAD lesion is 40% stenosed.   A drug-eluting stent was successfully placed using a SYNERGY XD [id number].   Post intervention, there is a 0% residual stenosis.   The LAD is a large vessel that courses to the apex. The proximal and mid LAD is calcified. The mid LAD has a focal, severe stenosis. The distal LAD has diffuse mild stenosis The Circumflex is a large caliber vessel with mild heavily calcified proximal stenosis. The obtuse marginal branch is a moderate caliber vessel with moderate ostial stenosis The RCA is a moderate caliber vessel with  severe mid stenosis and total occlusion in the distal vessel. The distal RCA branches fill from left to right collaterals.  Elevated LVEDP   Recommendations: He is pain free. Will admit to telemetry unit. Will plan DAPT for one year with ASA/Brilinta. High intensity statin. Echo tomorrow. No beta blocker or Ace-inh today with soft BP. Will give Lasix 40 mg IV tonight. Resume home insulin therapy.     Past Medical History:  Diagnosis Date    CAD (coronary artery disease)    Chronic kidney disease, stage 3a (HCC)    Diabetes ([place])    x 20 yrs   Diabetes mellitus    Hepatitis C    HFrEF (heart failure with reduced ejection fraction) (HCC)    Ischemic cardiomyopathy    Mitral regurgitation    Tobacco abuse     Past Surgical History:  Procedure Laterality Date   CIRCUMCISION     30 years ago   COLONOSCOPY N/A 01/24/2016   Procedure: COLONOSCOPY;  Surgeon: Rogene Houston, MD;  Location: [facility];  Service: Endoscopy;  Laterality: N/A;  1130   CORONARY/GRAFT ACUTE MI REVASCULARIZATION N/A 08/03/2021   Procedure: Coronary/Graft Acute MI Revascularization;  Surgeon: Burnell Blanks, MD;  Location: [facility];  Service: Cardiovascular;  Laterality: N/A;   LEFT HEART CATH AND CORONARY ANGIOGRAPHY N/A 08/03/2021   Procedure: LEFT HEART CATH AND CORONARY ANGIOGRAPHY;  Surgeon: Burnell Blanks, MD;  Location: [facility];  Service: Cardiovascular;  Laterality: N/A;    Current Medications: Current Meds  Medication Sig   aspirin EC 81 MG tablet Take 1 tablet (81 mg total) by mouth daily. Swallow whole.   atorvastatin (LIPITOR) 80 MG tablet Take 1 tablet (80 mg total) by mouth daily.   empagliflozin (JARDIANCE) 25 MG TABS tablet Take 25 mg by mouth daily.   furosemide (LASIX) 20 MG tablet Take 1 tablet (20 mg total) by mouth daily as needed for fluid or edema.   insulin glargine (LANTUS) 100 unit/mL SOPN Inject 40 Units into the skin every morning.   insulin NPH-insulin regular (NOVOLIN 70/30) (70-30) 100 UNIT/ML injection Inject 10-15 Units into the skin daily as needed (CBG >180).   metoprolol succinate (TOPROL-XL) 25 MG 24 hr tablet Take 0.5 tablets (12.5 mg total) by mouth daily.   nitroGLYCERIN (NITROSTAT) 0.4 MG SL tablet Place 1 tablet (0.4 mg total) under the tongue every 5 (five) minutes as needed.   sacubitril-valsartan (ENTRESTO) 49-51 MG Take 1 tablet by mouth 2 (two) times daily.    ticagrelor (BRILINTA) 90 MG TABS tablet Take 1 tablet (90 mg total) by mouth 2 (two) times daily.    Allergies:   Patient has no known allergies.   Social History   Socioeconomic History   Marital status: Single    Spouse name: Not on file   Number of children: 1   Years of education: Not on file   Highest education level: High school graduate  Occupational History   Occupation: retired  Tobacco Use   Smoking status: Former    Packs/day: 0.50    Years: 20.00    Pack years: 10.00    Types: Cigarettes    Quit date: 08/04/2020    Years since quitting: 1.1   Smokeless tobacco: Never   Tobacco comments:    Quit upon admission to hospital.  Vaping Use   Vaping Use: Never used  Substance and Sexual Activity   Alcohol use: Yes    Alcohol/week: 0.0 standard  drinks    Comment: occassionally  does not drink everyday. Occasional beer.   Drug use: Not Currently    Comment: cocaine   Sexual activity: Not on file  Other Topics Concern   Not on file  Social History Narrative   Not on file   Social Determinants of Health   Financial Resource Strain: Low Risk    Difficulty of Paying Living Expenses: Not very hard  Food Insecurity: No Food Insecurity   Worried About Running Out of Food in the Last Year: Never true   Ran Out of Food in the Last Year: Never true  Transportation Needs: No Transportation Needs   Lack of Transportation (Medical): No   Lack of Transportation (Non-Medical): No  Physical Activity: Not on file  Stress: Not on file  Social Connections: Not on file     Family History:  The patient's family history includes CVA in his father; Dementia in his mother.  ROS:   Please see the history of present illness.  All other systems are reviewed and otherwise negative.    EKG(s)/Additional Labs   EKG:  EKG is ordered today, personally reviewed, demonstrating NSR 87bpm, prior septal infarct, nonspecific STTW changes, one PVC -> appears improved from last tracing  08/12/21  Recent Labs: 08/03/2021: ALT 29; B Natriuretic Peptide 703.0 08/12/2021: Hemoglobin 15.4; Platelets 261 08/26/2021: BUN 25; Creatinine, Ser 1.43; Potassium 4.7; Sodium 138  Recent Lipid Panel    Component Value Date/Time   CHOL 180 08/06/2021 0333   TRIG 96 08/06/2021 0333   HDL 51 08/06/2021 0333   CHOLHDL 3.5 08/06/2021 0333   VLDL 19 08/06/2021 0333   LDLCALC 110 (H) 08/06/2021 0333    PHYSICAL EXAM:    VS:  BP 118/70    Pulse 87    Ht 5\' 11"  (1.803 m)    Wt 186 lb (84.4 kg)    SpO2 97%    BMI 25.94 kg/m   BMI: Body mass index is 25.94 kg/m.  GEN: Well nourished, well developed male in no acute distress HEENT: normocephalic, atraumatic Neck: no JVD, carotid bruits, or masses Cardiac: RRR; no murmurs, rubs, or gallops, no edema  Respiratory:  clear to auscultation bilaterally, normal work of breathing GI: soft, nontender, nondistended, + BS MS: no deformity or atrophy Skin: warm and dry, no rash, right radial cath site without hematoma or ecchymosis; good pulse (mild skin nodule healing at site) Neuro:  Alert and Oriented x 3, Strength and sensation are intact, follows commands Psych: euthymic mood, full affect  Wt Readings from Last 3 Encounters:  09/17/21 186 lb (84.4 kg)  08/26/21 184 lb 3.2 oz (83.6 kg)  08/12/21 184 lb 3.2 oz (83.6 kg)     ASSESSMENT & PLAN:   1. CAD s/p recent STEMI with HLD goal LDL <70 - clinically doing well. Reviewed medications and he has been adherent. Continue ASA, Brilinta, statin, BB. Residual disease to be treated medically. He is not having any angina at this juncture. He is medically ready to proceed with cardiac rehab from my standpoint. He said they called him previously but he had not yet committed but is interesting in going forward. Will provide him their phone number.  2. HFrEF/ICM - EF 30-35% by echo in 07/2021. He has been working closely with the AHF clinic for med titration. Since we are obtaining follow-up lipids today  will repeat electrolytes/renal function since Entresto was increased on 08/26/21. Continue f/u as planned in March with Dr. Aundra Dubin.  Reviewed  2g sodium restriction, 2L fluid restriction, daily weights with patient. Anticipate repeat echo after 3 months of medical therapy to evaluate whether ICD is needed - will defer to HF team to order when felt optimized. I do not see the concept of Lifevest was specifically mentioned in the hospital. I will reach out to Dr. Angelena Form who saw patient in the hospital for review. He is now 45 days out from MI and has not had any symptoms of arrhythmia.(Addendum: reviewed with Dr. Angelena Form who agreed this may be the type of patient to have considered at discharge but since he is now 45 days out and doing well, would not initiate Lifevest at this time.)  3. CKD stage 3a - will recheck kidney function today.  4. Moderate MR - anticipate this will be re-evaluated on repeat echocardiogram by heart failure team. No specific symptoms at this time.   Disposition: F/u with Dr. Harl Bowie to establish care in 4 months.   Medication Adjustments/Labs and Tests Ordered: Current medicines are reviewed at length with the patient today.  Concerns regarding medicines are outlined above. Medication changes, Labs and Tests ordered today are summarized above and listed in the Patient Instructions accessible in Encounters.    Signed, Charlie Pitter, PA-C  09/17/2021 2:38 PM    [facility] Location in [place], [place] [address] Ph: [phone]; Fax [phone]

## 2021-09-17 ENCOUNTER — Other Ambulatory Visit (HOSPITAL_COMMUNITY)
Admission: RE | Admit: 2021-09-17 | Discharge: 2021-09-17 | Disposition: A | Discharge status: Home / Self Care | Payer: Medicare Other | Source: Ambulatory Visit | Attending: Physician Assistant | Admitting: Physician Assistant

## 2021-09-17 ENCOUNTER — Encounter: Payer: Self-pay | Admitting: Physician Assistant

## 2021-09-17 ENCOUNTER — Ambulatory Visit (INDEPENDENT_AMBULATORY_CARE_PROVIDER_SITE_OTHER): Payer: Medicare Other | Admitting: Physician Assistant

## 2021-09-17 ENCOUNTER — Other Ambulatory Visit: Payer: Self-pay

## 2021-09-17 VITALS — BP 118/70 | HR 87 | Ht 71.0 in | Wt 186.0 lb

## 2021-09-17 DIAGNOSIS — E785 Hyperlipidemia, unspecified: Secondary | ICD-10-CM | POA: Insufficient documentation

## 2021-09-17 DIAGNOSIS — N1831 Chronic kidney disease, stage 3a: Secondary | ICD-10-CM

## 2021-09-17 DIAGNOSIS — I2510 Atherosclerotic heart disease of native coronary artery without angina pectoris: Secondary | ICD-10-CM

## 2021-09-17 DIAGNOSIS — I340 Nonrheumatic mitral (valve) insufficiency: Secondary | ICD-10-CM | POA: Diagnosis not present

## 2021-09-17 DIAGNOSIS — I5020 Unspecified systolic (congestive) heart failure: Secondary | ICD-10-CM | POA: Insufficient documentation

## 2021-09-17 LAB — CBC
HCT: 43.9 % (ref 39.0–52.0)
Hemoglobin: 14.7 g/dL (ref 13.0–17.0)
MCH: 31.4 pg (ref 26.0–34.0)
MCHC: 33.5 g/dL (ref 30.0–36.0)
MCV: 93.8 fL (ref 80.0–100.0)
Platelets: 185 10*3/uL (ref 150–400)
RBC: 4.68 MIL/uL (ref 4.22–5.81)
RDW: 12.8 % (ref 11.5–15.5)
WBC: 6.1 10*3/uL (ref 4.0–10.5)
nRBC: 0 % (ref 0.0–0.2)

## 2021-09-17 LAB — LDL CHOLESTEROL, DIRECT: Direct LDL: 65.4 mg/dL (ref 0–99)

## 2021-09-17 LAB — LIPID PANEL
Cholesterol: 147 mg/dL (ref 0–200)
HDL: 56 mg/dL (ref >40)
LDL Cholesterol: 75 mg/dL (ref 0–99)
Total CHOL/HDL Ratio: 2.6 RATIO
Triglycerides: 79 mg/dL (ref <150)
VLDL: 16 mg/dL (ref 0–40)

## 2021-09-17 NOTE — Patient Instructions (Addendum)
Labwork: CBC LIPID PANEL DIRECT LDL  Follow-Up: Follow up with Dr. Harl Bowie in 4 months to establish care  Any Other Special Instructions Will Be Listed Below (If Applicable).  CARDIAC REHAB [facility]- [id number]  If you need a refill on your cardiac medications before your next appointment, please call your pharmacy.   For patients with congestive heart failure, we give them these special instructions:  1. Follow a low-salt diet - you should be eating no more than 2,000mg  of sodium per day. This does not necessarily just apply to the salt you put on top of prepared food, but the sodium already in food. Processed food, frozen meals, canned goods, deli meat, and bread can have a surprising amount of sodium per serving so be sure to track this daily. 2. Watch your fluid intake. In general, you should not be taking in more than 2 liters of fluid per day (close to 64 oz of fluid per day). This includes sources of water in foods like soup, coffee, tea, milk, etc. It's important to stay hydrated but NOT to excess. 2. Weigh yourself on the same scale at same time of day and keep a log. 3. Call your doctor: (Anytime you feel any of the following symptoms)  - 3lb weight gain overnight or 5lb within a few days - Shortness of breath, with or without a dry hacking cough  - Swelling in the hands, feet or stomach  - If you have to sleep on extra pillows at night in order to breathe   IT IS IMPORTANT TO LET YOUR DOCTOR KNOW EARLY ON IF YOU ARE HAVING SYMPTOMS SO WE CAN HELP YOU!

## 2021-09-18 ENCOUNTER — Other Ambulatory Visit: Payer: Self-pay

## 2021-09-18 DIAGNOSIS — E785 Hyperlipidemia, unspecified: Secondary | ICD-10-CM

## 2021-09-18 LAB — COMPREHENSIVE METABOLIC PANEL
ALT: 49 U/L — ABNORMAL HIGH (ref 0–44)
AST: 48 U/L — ABNORMAL HIGH (ref 15–41)
Albumin: 3.7 g/dL (ref 3.5–5.0)
Alkaline Phosphatase: 76 U/L (ref 38–126)
Anion gap: 8 (ref 5–15)
BUN: 21 mg/dL (ref 8–23)
CO2: 21 mmol/L — ABNORMAL LOW (ref 22–32)
Calcium: 8.8 mg/dL — ABNORMAL LOW (ref 8.9–10.3)
Chloride: 106 mmol/L (ref 98–111)
Creatinine, Ser: 1.19 mg/dL (ref 0.61–1.24)
GFR, Estimated: >60 mL/min (ref >60)
Glucose, Bld: 117 mg/dL — ABNORMAL HIGH (ref 70–99)
Potassium: 4.7 mmol/L (ref 3.5–5.1)
Sodium: 135 mmol/L (ref 135–145)
Total Bilirubin: 0.2 mg/dL — ABNORMAL LOW (ref 0.3–1.2)
Total Protein: 7.1 g/dL (ref 6.5–8.1)

## 2021-09-19 ENCOUNTER — Encounter (HOSPITAL_COMMUNITY): Payer: Medicaid Other

## 2021-09-19 ENCOUNTER — Telehealth: Payer: Self-pay | Admitting: *Deleted

## 2021-09-19 DIAGNOSIS — E785 Hyperlipidemia, unspecified: Secondary | ICD-10-CM

## 2021-09-19 NOTE — Telephone Encounter (Signed)
-----   Message from Charlie Pitter, PA-C sent at 09/18/2021  8:43 AM EST ----- ?Please inform patient that labs largely look similar to prior. Kidney function slightly improved. Only abnormality is mild liver elevation. Would recommend repeating CMET in about 10-14 days to trend. ?

## 2021-09-19 NOTE — Telephone Encounter (Signed)
Pt notified and order placed 

## 2021-09-24 ENCOUNTER — Encounter (HOSPITAL_COMMUNITY): Payer: Medicare HMO | Admitting: Cardiology

## 2021-10-03 ENCOUNTER — Ambulatory Visit (HOSPITAL_COMMUNITY)
Admission: RE | Admit: 2021-10-03 | Discharge: 2021-10-03 | Disposition: A | Discharge status: Home / Self Care | Payer: Medicare Other | Source: Ambulatory Visit | Attending: Cardiology | Admitting: Cardiology

## 2021-10-03 ENCOUNTER — Other Ambulatory Visit: Payer: Self-pay

## 2021-10-03 ENCOUNTER — Encounter (HOSPITAL_COMMUNITY): Payer: Self-pay | Admitting: Cardiology

## 2021-10-03 VITALS — BP 118/78 | HR 86 | Wt 189.8 lb

## 2021-10-03 DIAGNOSIS — I5022 Chronic systolic (congestive) heart failure: Secondary | ICD-10-CM | POA: Diagnosis not present

## 2021-10-03 LAB — BASIC METABOLIC PANEL
Anion gap: 8 (ref 5–15)
BUN: 19 mg/dL (ref 8–23)
CO2: 22 mmol/L (ref 22–32)
Calcium: 8.5 mg/dL — ABNORMAL LOW (ref 8.9–10.3)
Chloride: 109 mmol/L (ref 98–111)
Creatinine, Ser: 1.33 mg/dL — ABNORMAL HIGH (ref 0.61–1.24)
GFR, Estimated: 58 mL/min — ABNORMAL LOW (ref >60)
Glucose, Bld: 110 mg/dL — ABNORMAL HIGH (ref 70–99)
Potassium: 4.7 mmol/L (ref 3.5–5.1)
Sodium: 139 mmol/L (ref 135–145)

## 2021-10-03 MED ORDER — ENTRESTO 49-51 MG PO TABS: 1.0000 | ORAL_TABLET | Freq: Two times a day (BID) | ORAL | 3 refills | Status: DC

## 2021-10-03 MED ORDER — SPIRONOLACTONE 25 MG PO TABS: 12.5000 mg | ORAL_TABLET | Freq: Every day | ORAL | 3 refills | Status: DC

## 2021-10-03 NOTE — Patient Instructions (Signed)
Medication Changes: ? ?Spironolactone 12.5 mg (1/2 Tab) daily. ? ?Lab Work: ? ?Labs done today, your results will be available in MyChart, we will contact you for abnormal readings. ? ? ?Testing/Procedures: ?Your physician has requested that you have an echocardiogram. Echocardiography is a painless test that uses sound waves to create images of your heart. It provides your doctor with information about the size and shape of your heart and how well your heart?s chambers and valves are working. This procedure takes approximately one hour. There are no restrictions for this procedure. ? ?Repeat blood work in 10 days at [facility] ? ?Referrals: ? ?Please follow up with our heart failure pharmacist in 3 weeks ? ? ?Special Instructions // Education: ? ?PLEASE MAKE SURE THAT YOU ARE ONLY TAKING ENTRESTO 49/51 Twice daily ? ? ?Follow-Up in: 6 weeks with echocardiogram ? ? ?At the [facility], you and your health needs are our priority. We have a designated team specialized in the treatment of Heart Failure. This Care Team includes your primary Heart Failure Specialized Cardiologist (physician), Advanced Practice Providers (APPs- Physician Assistants and Nurse Practitioners), and Pharmacist who all work together to provide you with the care you need, when you need it.  ? ?You may see any of the following providers on your designated Care Team at your next follow up: ? ?Dr Glori Bickers ?Dr Loralie Champagne ?Darrick Grinder, NP ?Lyda Jester, PA ?Jessica Milford,NP ?Marlyce Huge, PA ?Audry Riles, PharmD ? ? ?Please be sure to bring in all your medications bottles to every appointment.  ? ?Need to Contact us: ? ?If you have any questions or concerns before your next appointment please send [place] a message through [place] or call our office at [phone].   ? ?TO LEAVE A MESSAGE FOR THE NURSE SELECT OPTION 2, PLEASE LEAVE A MESSAGE INCLUDING: ?YOUR NAME ?DATE OF BIRTH ?CALL BACK NUMBER ?REASON FOR CALL**this  is important as we prioritize the call backs ? ?YOU WILL RECEIVE A CALL BACK THE SAME DAY AS LONG AS YOU CALL BEFORE 4:00 PM ? ? ?

## 2021-10-05 NOTE — Progress Notes (Signed)
? ?Primary Care: Dr [place] Crane ?Primary Cardiologist: Dr Gwenlyn Found  ?HF Cardiology: Dr. Aundra Dubin ? ?HPI: ?Patient was referred to HF clinic from [facility] clinic for evaluation of CHF.  ? ?Mr Jose Clark is a 71 y.o. with a history of ischemic cardiomyopathy, diabetes, CAD, tobacco abuse, and CKD Stage IIIa. ? ?Presented to [facility] in 1/23 with chest pain + shortness of breath and found to have anterior STEMI. Transferred to [facility] for cath. Underwent cath with multivessel disease and required DES to 99% proximal LAD stenosis; RCA was occluded with L>R collaterals. Started on GDMT with Toprol XL, jardiance, and losartan.  Echo this admission showed EF 30-35%, moderate MR.  ? ?Patient quit smoking in 1/23 and remains off tobacco.  ? ?He has been doing well symptomatically.  No chest pain or exertional dyspnea.  He does fatigue relatively easily.  No lightheadedness.  He is not sure if he is taking Entresto 24/26 bid or 49/51 bid (has both bottles).   ? ?Labs (2/23): LDL 75, HDl 56, K 4.7, creatinine 1.19 ? ?PMH: ?1. Chronic systolic CHF: Ischemic cardiomyopathy.  ?- Echo (1/23): EF 30-35%, normal RV, moderate MR.  ?2. CAD: Anterior STEMI 1/23 with 99% pLAD and CTO mid RCA with L>R collaterals.  ?- DES to pLAD.  ?3. CKD stage 3 ?4. Type 2 diabetes ?5. Hyperlipidemia ?6. Prior smoker ?7. HCV ? ?Review of Systems: All systems reviewed and negative as per HPI.  ? ?Current Outpatient Medications  ?Medication Sig Dispense Refill  ? aspirin EC 81 MG tablet Take 1 tablet (81 mg total) by mouth daily. Swallow whole. 90 tablet 3  ? atorvastatin (LIPITOR) 80 MG tablet Take 1 tablet (80 mg total) by mouth daily. 90 tablet 3  ? empagliflozin (JARDIANCE) 25 MG TABS tablet Take 25 mg by mouth daily.    ? insulin glargine (LANTUS) 100 unit/mL SOPN Inject 40 Units into the skin every morning.    ? insulin NPH-insulin regular (NOVOLIN 70/30) (70-30) 100 UNIT/ML injection Inject 10-15 Units into the skin daily as needed (CBG >180).    ? metoprolol succinate  (TOPROL-XL) 25 MG 24 hr tablet Take 0.5 tablets (12.5 mg total) by mouth daily. 30 tablet 6  ? nitroGLYCERIN (NITROSTAT) 0.4 MG SL tablet Place 1 tablet (0.4 mg total) under the tongue every 5 (five) minutes as needed. 25 tablet 12  ? sacubitril-valsartan (ENTRESTO) 49-51 MG Take 1 tablet by mouth 2 (two) times daily. 180 tablet 3  ? spironolactone (ALDACTONE) 25 MG tablet Take 0.5 tablets (12.5 mg total) by mouth daily. 45 tablet 3  ? ticagrelor (BRILINTA) 90 MG TABS tablet Take 1 tablet (90 mg total) by mouth 2 (two) times daily. 180 tablet 3  ? furosemide (LASIX) 20 MG tablet Take 1 tablet (20 mg total) by mouth daily as needed for fluid or edema. (Patient not taking: Reported on 10/03/2021) 30 tablet 2  ? ?No current facility-administered medications for this encounter.  ? ? ?No Known Allergies ? ?  ?Social History  ? ?Socioeconomic History  ? Marital status: Single  ?  Spouse name: Not on file  ? Number of children: 1  ? Years of education: Not on file  ? Highest education level: High school graduate  ?Occupational History  ? Occupation: retired  ?Tobacco Use  ? Smoking status: Former  ?  Packs/day: 0.50  ?  Years: 20.00  ?  Pack years: 10.00  ?  Types: Cigarettes  ?  Quit date: 08/04/2020  ?  Years since quitting:  1.1  ? Smokeless tobacco: Never  ? Tobacco comments:  ?  Quit upon admission to hospital.  ?Vaping Use  ? Vaping Use: Never used  ?Substance and Sexual Activity  ? Alcohol use: Yes  ?  Alcohol/week: 0.0 standard drinks  ?  Comment: occassionally  does not drink everyday. Occasional beer.  ? Drug use: Not Currently  ?  Comment: cocaine  ? Sexual activity: Not on file  ?Other Topics Concern  ? Not on file  ?Social History Narrative  ? Not on file  ? ?Social Determinants of Health  ? ?Financial Resource Strain: Low Risk   ? Difficulty of Paying Living Expenses: Not very hard  ?Food Insecurity: No Food Insecurity  ? Worried About Charity fundraiser in the Last Year: Never true  ? Ran Out of Food in the  Last Year: Never true  ?Transportation Needs: No Transportation Needs  ? Lack of Transportation (Medical): No  ? Lack of Transportation (Non-Medical): No  ?Physical Activity: Not on file  ?Stress: Not on file  ?Social Connections: Not on file  ?Intimate Partner Violence: Not on file  ? ? ?Family History  ?Problem Relation Age of Onset  ? Dementia Mother   ? CVA Father   ? ? ?Vitals:  ? 10/03/21 1505  ?BP: 118/78  ?Pulse: 86  ?SpO2: 98%  ?Weight: 86.1 kg (189 lb 12.8 oz)  ? ?Wt Readings from Last 3 Encounters:  ?10/03/21 86.1 kg (189 lb 12.8 oz)  ?09/17/21 84.4 kg (186 lb)  ?08/26/21 83.6 kg (184 lb 3.2 oz)  ? ?PHYSICAL EXAM: ?General: NAD ?Neck: No JVD, no thyromegaly or thyroid nodule.  ?Lungs: Clear to auscultation bilaterally with normal respiratory effort. ?CV: Nondisplaced PMI.  Heart regular S1/S2, no S3/S4, no murmur.  No peripheral edema.  No carotid bruit.  Normal pedal pulses.  ?Abdomen: Soft, nontender, no hepatosplenomegaly, no distention.  ?Skin: Intact without lesions or rashes.  ?Neurologic: Alert and oriented x 3.  ?Psych: Normal affect. ?Extremities: No clubbing or cyanosis.  ?HEENT: Normal. ? ?ASSESSMENT & PLAN: ?1. Chronic systolic CHF: Ischemic cardiomyopathy.  Echo (1/23) with EF 30-35%, moderate MR, normal RV.  NYHA class I-II, not volume overloaded on exam.  ?- He should be taking Entresto 49/51 bid (unsure of dose).  He will make sure he takes it correctly.  ?- Start spironolactone 12.5 mg daily.  BMET today and in 10 days.  ?- Continue empagliflozin ?- Continue Toprol XL 12.5 daily.  ?- Echo in 4/23 to assess for ICD.  ?2.  CAD: STEMI in 1/23 with 99% pLAD and CTO RCA, he had DES to LAD. No chest pain.  ?- Continue ASA 81 and ticagrelor.  ?- Continue statin.  ?3. Type 2 diabetes: Per PCP.  ?4. Tobacco Abuse: Congratulated on quitting.  ?5. CKD Stage IIIa: BMET today.  ? ?Followup 3 wks with HF pharmacist for med titration. Followup in 6 wks with echo.  ? ?Loralie Champagne ?10/05/2021 ? ?

## 2021-10-24 NOTE — Progress Notes (Signed)
?[facility] Note ? ?10/25/2021 ? ?  ? ?CHIEF COMPLAINT ?Patient presents for Retina Follow Up ? ? ? ?HISTORY OF PRESENT ILLNESS: ?Jose Clark is a 71 y.o. male who presents to the clinic today for:  ? ?HPI   ? ? Retina Follow Up   ?Patient presents with  Diabetic Retinopathy.  In both eyes.  Severity is moderate.  Duration of 5 months.  Since onset it is stable.  I, the attending physician,  performed the HPI with the patient and updated documentation appropriately. ? ?  ?  ? ? Comments   ?Pt here for 5 mo ret f/u PDR OU. Pt was hospitalized for a heart attack and stent placement, discharged in Jan 2023. Pt states VA seems about the same, no major changed. Last A1c performed in Jan was 7.8. Pt due for recheck in the next few weeks, hoping for improvement. Sugar levels reportedly well controlled. BS 94 this am.  ? ?  ?  ?Last edited by Bernarda Caffey, MD on 10/25/2021  9:11 AM.  ?  ?Pt has been lost to follow up since November 2022, pt had MI on 01.14.23, pt states he had 1 stent place and his chest pain has since improved ? ? ?Referring physician: ?Celene Squibb, MD ?[address] ?[address] ?[place],  [place] [address] ? ?HISTORICAL INFORMATION:  ? ?Selected notes from the [facility] ?Referred by Dr. Kathlen Mody for DME  ? ?CURRENT MEDICATIONS: ?No current outpatient medications on file. (Ophthalmic Drugs)  ? ?No current facility-administered medications for this visit. (Ophthalmic Drugs)  ? ?Current Outpatient Medications (Other)  ?Medication Sig  ? aspirin EC 81 MG tablet Take 1 tablet (81 mg total) by mouth daily. Swallow whole.  ? atorvastatin (LIPITOR) 80 MG tablet Take 1 tablet (80 mg total) by mouth daily.  ? empagliflozin (JARDIANCE) 25 MG TABS tablet Take 25 mg by mouth daily.  ? insulin glargine (LANTUS) 100 unit/mL SOPN Inject 40 Units into the skin every morning.  ? insulin NPH-insulin regular (NOVOLIN 70/30) (70-30) 100 UNIT/ML injection Inject 10-15 Units into the skin daily as  needed (CBG >180).  ? metoprolol succinate (TOPROL-XL) 25 MG 24 hr tablet Take 0.5 tablets (12.5 mg total) by mouth daily.  ? nitroGLYCERIN (NITROSTAT) 0.4 MG SL tablet Place 1 tablet (0.4 mg total) under the tongue every 5 (five) minutes as needed.  ? sacubitril-valsartan (ENTRESTO) 49-51 MG Take 1 tablet by mouth 2 (two) times daily.  ? spironolactone (ALDACTONE) 25 MG tablet Take 0.5 tablets (12.5 mg total) by mouth daily.  ? ticagrelor (BRILINTA) 90 MG TABS tablet Take 1 tablet (90 mg total) by mouth 2 (two) times daily.  ? furosemide (LASIX) 20 MG tablet Take 1 tablet (20 mg total) by mouth daily as needed for fluid or edema. (Patient not taking: Reported on 10/03/2021)  ? ?No current facility-administered medications for this visit. (Other)  ? ?REVIEW OF SYSTEMS: ?ROS   ?Positive for: Endocrine, Eyes ?Negative for: Constitutional, Gastrointestinal, Neurological, Skin, Genitourinary, Musculoskeletal, HENT, Cardiovascular, Respiratory, Psychiatric, Allergic/Imm, Heme/Lymph ?Last edited by Kingsley Spittle, COT on 10/25/2021  8:27 AM.  ?  ? ?ALLERGIES ?No Known Allergies ? ?PAST MEDICAL HISTORY ?Past Medical History:  ?Diagnosis Date  ? CAD (coronary artery disease)   ? Chronic kidney disease, stage 3a ([place])   ? Diabetes ([place])   ? x 20 yrs  ? Diabetes mellitus   ? Heart disease   ? Hepatitis C   ?  HFrEF (heart failure with reduced ejection fraction) ([place])   ? Ischemic cardiomyopathy   ? Mitral regurgitation   ? Tobacco abuse   ? ?Past Surgical History:  ?Procedure Laterality Date  ? CIRCUMCISION    ? 30 years ago  ? COLONOSCOPY N/A 01/24/2016  ? Procedure: COLONOSCOPY;  Surgeon: Rogene Houston, MD;  [facility];  Service: Endoscopy;  Laterality: N/A;  1130  ? CORONARY/GRAFT ACUTE MI REVASCULARIZATION N/A 08/03/2021  ? Procedure: Coronary/Graft Acute MI Revascularization;  Surgeon: Burnell Blanks, MD;  Location: [facility];  Service: Cardiovascular;  Laterality: N/A;  ? LEFT HEART CATH  AND CORONARY ANGIOGRAPHY N/A 08/03/2021  ? Procedure: LEFT HEART CATH AND CORONARY ANGIOGRAPHY;  Surgeon: Burnell Blanks, MD;  Location: [facility];  Service: Cardiovascular;  Laterality: N/A;  ? ?FAMILY HISTORY ?Family History  ?Problem Relation Age of Onset  ? Dementia Mother   ? CVA Father   ? ?SOCIAL HISTORY ?Social History  ? ?Tobacco Use  ? Smoking status: Former  ?  Packs/day: 0.50  ?  Years: 20.00  ?  Pack years: 10.00  ?  Types: Cigarettes  ?  Quit date: 08/04/2020  ?  Years since quitting: 1.2  ? Smokeless tobacco: Never  ? Tobacco comments:  ?  Quit upon admission to hospital.  ?Vaping Use  ? Vaping Use: Never used  ?Substance Use Topics  ? Alcohol use: Yes  ?  Alcohol/week: 0.0 standard drinks  ?  Comment: occassionally  does not drink everyday. Occasional beer.  ? Drug use: Not Currently  ?  Comment: cocaine  ?  ? ?  ?OPHTHALMIC EXAM: ?Base Eye Exam   ? ? Visual Acuity (Snellen - Linear)   ? ?   Right Left  ? Dist cc 20/40 +2 20/30  ? Dist ph cc NI NI  ? ? Correction: Glasses  ? ?  ?  ? ? Tonometry (Tonopen, 8:35 AM)   ? ?   Right Left  ? Pressure 12 10  ? ?  ?  ? ? Pupils   ? ?   Dark Light Shape React APD  ? Right 2 1 Round Minimal None  ? Left 2 1 Round Minimal None  ? ?  ?  ? ? Visual Fields (Counting fingers)   ? ?   Left Right  ?  Full Full  ? ?  ?  ? ? Extraocular Movement   ? ?   Right Left  ?  Full, Ortho Full, Ortho  ? ?  ?  ? ? Neuro/Psych   ? ? Oriented x3: Yes  ? Mood/Affect: Normal  ? ?  ?  ? ? Dilation   ? ? Both eyes: 1.0% Mydriacyl, 2.5% Phenylephrine @ 8:36 AM  ? ?  ?  ? ?  ? ?Slit Lamp and Fundus Exam   ? ? External Exam   ? ?   Right Left  ? External Normal Normal  ? ?  ?  ? ? Slit Lamp Exam   ? ?   Right Left  ? Lids/Lashes Dermatochalasis - upper lid Dermatochalasis - upper lid  ? Conjunctiva/Sclera nasal and temporal pinguecula, Melanosis nasal and temporal pinguecula, Melanosis  ? Cornea arcus, 1+ fine Punctate epithelial erosions, well healed cataract wound, mild  tear film debris arcus, 1-2+ Punctate epithelial erosions, well healed cataract wound, mild tear film debris  ? Anterior Chamber Deep and quiet Deep and quiet  ? Iris Round  and moderately dilated, No NVI Round and moderately dilated, No NVI  ? Lens PC IOL in good position PC IOL in good position  ? Anterior Vitreous Vitreous syneresis Vitreous syneresis  ? ?  ?  ? ? Fundus Exam   ? ?   Right Left  ? Disc Pink and Sharp, PPA Pink and Sharp, PPA, +cupping, narrow inferior rim  ? C/D Ratio 0.6 0.7  ? Macula good foveal reflex, scattered MA, cystic changes/edema temporal mac - persistent, focal punctate exudates temporal macula Flat, good foveal reflex, RPE mottling, scattered MA, no edema  ? Vessels attenuated, Tortuous, early fibrotic NVE, AV crossing changes attenuated, Tortuous, mild Copper wiring, early NVE, mild AV crossing changes  ? Periphery Attached, scattered MA    Attached, scattered MA, good segmental PRP changes IT midzone and IN quad  ? ?  ?  ? ?  ? ?Refraction   ? ? Wearing Rx   ? ?   Sphere Cylinder Axis Add  ? Right -2.25 +1.75 088 +2.50  ? Left -0.75 +1.50 090 +2.50  ? ?  ?  ? ?  ? ?IMAGING AND PROCEDURES  ?Imaging and Procedures for 10/25/2021 ? ?OCT, Retina - OU - Both Eyes   ? ?   ?Right Eye ?Quality was good. Central Foveal Thickness: 273. Progression has been stable. Findings include normal foveal contour, intraretinal fluid, no SRF (Persistent IRF).  ? ?Left Eye ?Quality was good. Central Foveal Thickness: 275. Progression has been stable. Findings include no SRF, normal foveal contour, no IRF, vitreomacular adhesion .  ? ?Notes ?*Images captured and stored on drive ? ?Diagnosis / Impression:  ?NFP OU ?[id number] IRF ?OS: no IRF/SRF ? ?Clinical management:  ?See below ? ?Abbreviations: NFP - Normal foveal profile. CME - cystoid macular edema. PED - pigment epithelial detachment. IRF - intraretinal fluid. SRF - subretinal fluid. EZ - ellipsoid zone. ERM - epiretinal membrane. ORA - outer retinal  atrophy. ORT - outer retinal tubulation. SRHM - subretinal hyper-reflective material. IRHM - intraretinal hyper-reflective material ? ? ?  ?  ?  ? ?  ?ASSESSMENT/PLAN: ? ?  ICD-10-CM   ?1. Proliferati

## 2021-10-25 ENCOUNTER — Ambulatory Visit (INDEPENDENT_AMBULATORY_CARE_PROVIDER_SITE_OTHER): Payer: Medicare Other | Admitting: Ophthalmology

## 2021-10-25 ENCOUNTER — Encounter (INDEPENDENT_AMBULATORY_CARE_PROVIDER_SITE_OTHER): Payer: Self-pay | Admitting: Ophthalmology

## 2021-10-25 DIAGNOSIS — H35033 Hypertensive retinopathy, bilateral: Secondary | ICD-10-CM | POA: Diagnosis not present

## 2021-10-25 DIAGNOSIS — E113513 Type 2 diabetes mellitus with proliferative diabetic retinopathy with macular edema, bilateral: Secondary | ICD-10-CM

## 2021-10-25 DIAGNOSIS — Z961 Presence of intraocular lens: Secondary | ICD-10-CM | POA: Diagnosis not present

## 2021-10-25 DIAGNOSIS — I10 Essential (primary) hypertension: Secondary | ICD-10-CM | POA: Diagnosis not present

## 2021-10-25 NOTE — Progress Notes (Signed)
?  ?  Advanced Heart Failure Clinic Note  ? ?Referring Physician:Dr. Gwenlyn Found  ?Primary Care: Dr. Nevada Crane ?Primary Cardiologist: Dr. Gwenlyn Found  ?Heart failure Cardiologist: Dr. Aundra Dubin ? ?HPI:  ?Patient was referred to HF clinic from [facility] for evaluation of CHF.  ?  ?Mr Crotty is a 71 year old with a history of HFrEF, ICM, TDMII, CAD, tobacco use, and CKD Stage IIIa. ?  ?Presented to [facility] in 07/2021 with chest pain + shortness of breath and found to have anterior STEMI. Transferred to [facility] for cath. Underwent cath with multivessel disease and required DES to 99% proximal LAD stenosis; RCA was occluded with L>R collaterals. Started on GDMT with metoprolol XL, Jardiance, and losartan.  Echo this admission showed EF 30-35%, moderate MR.  ? ?Patient quit smoking in 07/2021 and remains off tobacco. ? ?Presented to HF clinic on 10/03/21. He had been doing well symptomatically.  No chest pain or exertional dyspnea.  He reported that he fatigues relatively easily.  No lightheadedness.  He was not sure if he was taking Entresto 24/26 mg BID or 49/51 mg BID (had both bottles).   ? ?Today he returns to HF clinic for pharmacist medication titration. At last visit with MD, Delene Loll was increased to 49/51 mg BID and spironolactone 12.5 mg daily was initiated. Overall he is feeling well today. Notes that his health is overall improved since stopping smoking and drinking. Notes occasional dizziness when he stands up too fast. This lasts a few seconds then resolves. No CP or palpitations. Notes he can walk 0.25 miles before becoming SOB. Weight at home has been stable at 182 lbs. He has not needed to use any PRN Lasix. No LEE, PND or orthopnea. Taking all medications as prescribed and tolerating all medications.  ? ? ?HF Medications: ?Metoprolol succinate 12.5 mg daily ?Entresto 49/51 mg BID ?Spironolactone 12.5 mg daily ?Jardiance 25 mg daily ?Furosemide 20 mg PRN weight gain/edema  ? ?Has the patient been experiencing any side effects to the  medications prescribed? NO ? ?Does the patient have any problems obtaining medications due to transportation or finances? Has [employer] Medicare and Medicaid ? ?Understanding of regimen: fair ?Understanding of indications: fair ?Potential of compliance: good ?Patient understands to avoid NSAIDs. ?Patient understands to avoid decongestants. ?  ? ?Pertinent Lab Values: ?10/03/21: Serum creatinine 1.33, BUN 19, Potassium 4.7, Sodium 139 ? ? ?Vital Signs: ?Weight: 188 lbs (last clinic weight: 189.8 lbs) ?Blood pressure: 120/68 mmHg ?Heart rate: 85 bpm ? ?Assessment/Plan: ?1. Chronic systolic CHF: Ischemic cardiomyopathy.  Echo (07/2021) with EF 30-35%, moderate MR, normal RV.   ?- NYHA class I-II, not volume overloaded on exam.  ?- BMET today pending ?- Continue furosemide 20 mg PRN ?- Increase metoprolol XL to 25 mg daily  ?- Continue Entresto 49-51 mg BID.  ?- Continue spironolactone 12.5 mg daily ?- Continue Jardiance 25 mg daily  ?- Echo 11/12/21 to assess for ICD.  ? ?2.  CAD: STEMI in 07/2021 with 99% pLAD and CTO RCA, he had DES to LAD. No chest pain.  ?- Continue ASA 81 and ticagrelor.  ?- Continue statin.  ?3. Type 2 diabetes: Per PCP.  ?4. Tobacco Abuse: Congratulated on quitting.  ?5. CKD Stage IIIa: BMET today.  ?  ?Follow up in 2 weeks with Dr Domingo Dimes.  ? ?Audry Riles, PharmD, BCPS, BCCP, CPP ?Heart Failure Clinic Pharmacist ?[phone] ?  ?

## 2021-10-31 ENCOUNTER — Ambulatory Visit (HOSPITAL_COMMUNITY)
Admission: RE | Admit: 2021-10-31 | Discharge: 2021-10-31 | Disposition: A | Discharge status: Home / Self Care | Payer: Medicare Other | Source: Ambulatory Visit | Attending: Internal Medicine | Admitting: Internal Medicine

## 2021-10-31 VITALS — BP 120/68 | HR 85 | Wt 188.0 lb

## 2021-10-31 DIAGNOSIS — Z7984 Long term (current) use of oral hypoglycemic drugs: Secondary | ICD-10-CM | POA: Insufficient documentation

## 2021-10-31 DIAGNOSIS — Z7902 Long term (current) use of antithrombotics/antiplatelets: Secondary | ICD-10-CM | POA: Insufficient documentation

## 2021-10-31 DIAGNOSIS — I255 Ischemic cardiomyopathy: Secondary | ICD-10-CM | POA: Diagnosis not present

## 2021-10-31 DIAGNOSIS — I2510 Atherosclerotic heart disease of native coronary artery without angina pectoris: Secondary | ICD-10-CM | POA: Insufficient documentation

## 2021-10-31 DIAGNOSIS — Z79899 Other long term (current) drug therapy: Secondary | ICD-10-CM | POA: Diagnosis not present

## 2021-10-31 DIAGNOSIS — E1122 Type 2 diabetes mellitus with diabetic chronic kidney disease: Secondary | ICD-10-CM | POA: Diagnosis not present

## 2021-10-31 DIAGNOSIS — N1831 Chronic kidney disease, stage 3a: Secondary | ICD-10-CM | POA: Insufficient documentation

## 2021-10-31 DIAGNOSIS — I252 Old myocardial infarction: Secondary | ICD-10-CM | POA: Insufficient documentation

## 2021-10-31 DIAGNOSIS — Z955 Presence of coronary angioplasty implant and graft: Secondary | ICD-10-CM | POA: Diagnosis not present

## 2021-10-31 DIAGNOSIS — Z7982 Long term (current) use of aspirin: Secondary | ICD-10-CM | POA: Insufficient documentation

## 2021-10-31 DIAGNOSIS — I5022 Chronic systolic (congestive) heart failure: Secondary | ICD-10-CM | POA: Diagnosis not present

## 2021-10-31 DIAGNOSIS — F1091 Alcohol use, unspecified, in remission: Secondary | ICD-10-CM | POA: Diagnosis not present

## 2021-10-31 DIAGNOSIS — Z87891 Personal history of nicotine dependence: Secondary | ICD-10-CM | POA: Diagnosis not present

## 2021-10-31 LAB — BASIC METABOLIC PANEL
Anion gap: 5 (ref 5–15)
BUN: 24 mg/dL — ABNORMAL HIGH (ref 8–23)
CO2: 20 mmol/L — ABNORMAL LOW (ref 22–32)
Calcium: 8.6 mg/dL — ABNORMAL LOW (ref 8.9–10.3)
Chloride: 112 mmol/L — ABNORMAL HIGH (ref 98–111)
Creatinine, Ser: 1.39 mg/dL — ABNORMAL HIGH (ref 0.61–1.24)
GFR, Estimated: 55 mL/min — ABNORMAL LOW (ref >60)
Glucose, Bld: 139 mg/dL — ABNORMAL HIGH (ref 70–99)
Potassium: 4.6 mmol/L (ref 3.5–5.1)
Sodium: 137 mmol/L (ref 135–145)

## 2021-10-31 MED ORDER — METOPROLOL SUCCINATE ER 25 MG PO TB24
25.0000 mg | ORAL_TABLET | Freq: Every day | ORAL | 3 refills | Status: DC
Start: 2021-10-31 — End: 2021-11-12

## 2021-10-31 NOTE — Patient Instructions (Signed)
It was a pleasure seeing you today! ? ?MEDICATIONS: ?-We are changing your medications today ?-Increase metoprolol succinate to 25 mg (1 tablet) daily ?-Call if you have questions about your medications. ? ?LABS: ?-We will call you if your labs need attention. ? ?NEXT APPOINTMENT: ?Return to clinic in 2 weeks with Dr. Aundra Dubin. ? ?In general, to take care of your heart failure: ?-Limit your fluid intake to 2 Liters (half-gallon) per day.   ?-Limit your salt intake to ideally 2-3 grams (2000-3000 mg) per day. ?-Weigh yourself daily and record, and bring that "weight diary" to your next appointment.  (Weight gain of 2-3 pounds in 1 day typically means fluid weight.) ?-The medications for your heart are to help your heart and help you live longer.   ?-Please contact us before stopping any of your heart medications. ? ?Call the clinic at [phone] with questions or to reschedule future appointments.  ?

## 2021-11-12 ENCOUNTER — Ambulatory Visit (HOSPITAL_COMMUNITY)
Admission: RE | Admit: 2021-11-12 | Discharge: 2021-11-12 | Disposition: A | Discharge status: Home / Self Care | Payer: Medicare Other | Source: Ambulatory Visit | Attending: Cardiology | Admitting: Cardiology

## 2021-11-12 ENCOUNTER — Encounter (HOSPITAL_COMMUNITY): Payer: Self-pay | Admitting: Cardiology

## 2021-11-12 ENCOUNTER — Ambulatory Visit (HOSPITAL_BASED_OUTPATIENT_CLINIC_OR_DEPARTMENT_OTHER)
Admission: RE | Admit: 2021-11-12 | Discharge: 2021-11-12 | Disposition: A | Discharge status: Home / Self Care | Payer: Medicare Other | Source: Ambulatory Visit | Attending: Cardiology | Admitting: Cardiology

## 2021-11-12 VITALS — BP 130/90 | HR 75 | Wt 194.0 lb

## 2021-11-12 DIAGNOSIS — E1122 Type 2 diabetes mellitus with diabetic chronic kidney disease: Secondary | ICD-10-CM | POA: Diagnosis not present

## 2021-11-12 DIAGNOSIS — Z7982 Long term (current) use of aspirin: Secondary | ICD-10-CM | POA: Insufficient documentation

## 2021-11-12 DIAGNOSIS — I2510 Atherosclerotic heart disease of native coronary artery without angina pectoris: Secondary | ICD-10-CM | POA: Diagnosis not present

## 2021-11-12 DIAGNOSIS — I5022 Chronic systolic (congestive) heart failure: Secondary | ICD-10-CM

## 2021-11-12 DIAGNOSIS — I255 Ischemic cardiomyopathy: Secondary | ICD-10-CM | POA: Diagnosis not present

## 2021-11-12 DIAGNOSIS — I252 Old myocardial infarction: Secondary | ICD-10-CM | POA: Diagnosis not present

## 2021-11-12 DIAGNOSIS — N1831 Chronic kidney disease, stage 3a: Secondary | ICD-10-CM | POA: Insufficient documentation

## 2021-11-12 DIAGNOSIS — Z79899 Other long term (current) drug therapy: Secondary | ICD-10-CM | POA: Diagnosis not present

## 2021-11-12 DIAGNOSIS — Z87891 Personal history of nicotine dependence: Secondary | ICD-10-CM | POA: Diagnosis not present

## 2021-11-12 LAB — ECHOCARDIOGRAM COMPLETE
AR max vel: 1.9 cm2
AV Peak grad: 3.9 mmHg
Ao pk vel: 0.98 m/s
Area-P 1/2: 3.56 cm2
Calc EF: 36.2 %
MV M vel: 4.87 m/s
MV Peak grad: 95 mmHg
S' Lateral: 4.5 cm
Single Plane A2C EF: 36.2 %
Single Plane A4C EF: 36.9 %

## 2021-11-12 LAB — BASIC METABOLIC PANEL
Anion gap: 7 (ref 5–15)
BUN: 21 mg/dL (ref 8–23)
CO2: 22 mmol/L (ref 22–32)
Calcium: 8.7 mg/dL — ABNORMAL LOW (ref 8.9–10.3)
Chloride: 106 mmol/L (ref 98–111)
Creatinine, Ser: 1.37 mg/dL — ABNORMAL HIGH (ref 0.61–1.24)
GFR, Estimated: 56 mL/min — ABNORMAL LOW (ref >60)
Glucose, Bld: 159 mg/dL — ABNORMAL HIGH (ref 70–99)
Potassium: 5.1 mmol/L (ref 3.5–5.1)
Sodium: 135 mmol/L (ref 135–145)

## 2021-11-12 LAB — BRAIN NATRIURETIC PEPTIDE: B Natriuretic Peptide: 125.7 pg/mL — ABNORMAL HIGH (ref 0.0–100.0)

## 2021-11-12 MED ORDER — SPIRONOLACTONE 25 MG PO TABS: 25.0000 mg | ORAL_TABLET | Freq: Every day | ORAL | 3 refills | Status: DC

## 2021-11-12 MED ORDER — PERFLUTREN LIPID MICROSPHERE
1.0000 mL | INTRAVENOUS | Status: DC | PRN
  Administered 2021-11-12: 2 mL via INTRAVENOUS
  Filled 2021-11-12: qty 10

## 2021-11-12 MED ORDER — METOPROLOL SUCCINATE ER 50 MG PO TB24: 50.0000 mg | ORAL_TABLET | Freq: Every day | ORAL | 3 refills | Status: DC

## 2021-11-12 NOTE — Patient Instructions (Signed)
Medication Changes: ? ?Increase Spironolactone to '50mg'$  daily ? ?Incresae Toprol XL to '50mg'$  daily ? ?Lab Work: ? ?Labs done today, your results will be available in MyChart, we will contact you for abnormal readings. ? ? ?Testing/Procedures: ? ?Repeat blood work in 10 days ? ?Referrals: ? ?You have been referred to Cardiac Rehab. They will call you to arrange your appointment ? ?You have been referred to the Cardiology Electrophysiologist. They will call you to arrange your appointment ? ? ? ? ?Special Instructions // Education: ? ?none ? ?Follow-Up in: 2 months  ? ?At the [facility], you and your health needs are our priority. We have a designated team specialized in the treatment of Heart Failure. This Care Team includes your primary Heart Failure Specialized Cardiologist (physician), Advanced Practice Providers (APPs- Physician Assistants and Nurse Practitioners), and Pharmacist who all work together to provide you with the care you need, when you need it.  ? ?You may see any of the following providers on your designated Care Team at your next follow up: ? ?Dr Glori Bickers ?Dr Loralie Champagne ?Darrick Grinder, NP ?Lyda Jester, PA ?Jessica Milford,NP ?Marlyce Huge, PA ?Audry Riles, PharmD ? ? ?Please be sure to bring in all your medications bottles to every appointment.  ? ?Need to Contact us: ? ?If you have any questions or concerns before your next appointment please send [place] a message through [place] or call our office at [phone].   ? ?TO LEAVE A MESSAGE FOR THE NURSE SELECT OPTION 2, PLEASE LEAVE A MESSAGE INCLUDING: ?YOUR NAME ?DATE OF BIRTH ?CALL BACK NUMBER ?REASON FOR CALL**this is important as we prioritize the call backs ? ?YOU WILL RECEIVE A CALL BACK THE SAME DAY AS LONG AS YOU CALL BEFORE 4:00 PM ? ? ?

## 2021-11-13 NOTE — Progress Notes (Signed)
? ?Primary Care: Dr Nevada Crane ?Primary Cardiologist: Dr Gwenlyn Found  ?HF Cardiology: Dr. Aundra Dubin ? ?HPI: ?Patient was referred to HF clinic from [facility] clinic for evaluation of CHF.  ? ?Mr Tamer is a 71 y.o. with a history of ischemic cardiomyopathy, diabetes, CAD, tobacco abuse, and CKD Stage IIIa. ? ?Presented to [facility] in 1/23 with chest pain + shortness of breath and found to have anterior STEMI. Transferred to [facility] for cath. Underwent cath with multivessel disease and required DES to 99% proximal LAD stenosis; RCA was occluded with L>R collaterals. Started on GDMT with Toprol XL, jardiance, and losartan.  Echo this admission showed EF 30-35%, moderate MR.  ? ?Patient quit smoking in 1/23 and remains off tobacco.  ? ?Echo was done today and reviewed, EF 30-35% with mildly decreased RV function, IVC normal.  ? ?He has been doing well symptomatically.  No dyspnea walking up stairs or walking on flat ground.  No chest pain.  Able to do yardwork.  Generally feeling good.   ? ?Labs (2/23): LDL 75, HDl 56, K 4.7, creatinine 1.19 ? ?PMH: ?1. Chronic systolic CHF: Ischemic cardiomyopathy.  ?- Echo (1/23): EF 30-35%, normal RV, moderate MR.  ?- Echo (4/23): EF 30-35% with mildly decreased RV function, IVC normal.  ?2. CAD: Anterior STEMI 1/23 with 99% pLAD and CTO mid RCA with L>R collaterals.  ?- DES to pLAD.  ?3. CKD stage 3 ?4. Type 2 diabetes ?5. Hyperlipidemia ?6. Prior smoker ?7. HCV ? ?Review of Systems: All systems reviewed and negative as per HPI.  ? ?Current Outpatient Medications  ?Medication Sig Dispense Refill  ? aspirin EC 81 MG tablet Take 1 tablet (81 mg total) by mouth daily. Swallow whole. 90 tablet 3  ? atorvastatin (LIPITOR) 80 MG tablet Take 1 tablet (80 mg total) by mouth daily. 90 tablet 3  ? empagliflozin (JARDIANCE) 25 MG TABS tablet Take 25 mg by mouth daily.    ? furosemide (LASIX) 20 MG tablet Take 1 tablet (20 mg total) by mouth daily as needed for fluid or edema. 30 tablet 2  ? insulin glargine (LANTUS) 100  unit/mL SOPN Inject 40 Units into the skin every morning.    ? insulin NPH-insulin regular (NOVOLIN 70/30) (70-30) 100 UNIT/ML injection Inject 10-15 Units into the skin daily as needed (CBG >180).    ? nitroGLYCERIN (NITROSTAT) 0.4 MG SL tablet Place 1 tablet (0.4 mg total) under the tongue every 5 (five) minutes as needed. 25 tablet 12  ? RYBELSUS 7 MG TABS Take 1 tablet by mouth daily.    ? sacubitril-valsartan (ENTRESTO) 49-51 MG Take 1 tablet by mouth 2 (two) times daily. 180 tablet 3  ? ticagrelor (BRILINTA) 90 MG TABS tablet Take 1 tablet (90 mg total) by mouth 2 (two) times daily. 180 tablet 3  ? metoprolol succinate (TOPROL-XL) 50 MG 24 hr tablet Take 1 tablet (50 mg total) by mouth daily. 90 tablet 3  ? spironolactone (ALDACTONE) 25 MG tablet Take 1 tablet (25 mg total) by mouth daily. 90 tablet 3  ? ?No current facility-administered medications for this encounter.  ? ? ?No Known Allergies ? ?  ?Social History  ? ?Socioeconomic History  ? Marital status: Single  ?  Spouse name: Not on file  ? Number of children: 1  ? Years of education: Not on file  ? Highest education level: High school graduate  ?Occupational History  ? Occupation: retired  ?Tobacco Use  ? Smoking status: Former  ?  Packs/day: 0.50  ?  Years: 20.00  ?  Pack years: 10.00  ?  Types: Cigarettes  ?  Quit date: 08/04/2020  ?  Years since quitting: 1.2  ? Smokeless tobacco: Never  ? Tobacco comments:  ?  Quit upon admission to hospital.  ?Vaping Use  ? Vaping Use: Never used  ?Substance and Sexual Activity  ? Alcohol use: Yes  ?  Alcohol/week: 0.0 standard drinks  ?  Comment: occassionally  does not drink everyday. Occasional beer.  ? Drug use: Not Currently  ?  Comment: cocaine  ? Sexual activity: Yes  ?Other Topics Concern  ? Not on file  ?Social History Narrative  ? Not on file  ? ?Social Determinants of Health  ? ?Financial Resource Strain: Low Risk   ? Difficulty of Paying Living Expenses: Not very hard  ?Food Insecurity: No Food  Insecurity  ? Worried About Charity fundraiser in the Last Year: Never true  ? Ran Out of Food in the Last Year: Never true  ?Transportation Needs: No Transportation Needs  ? Lack of Transportation (Medical): No  ? Lack of Transportation (Non-Medical): No  ?Physical Activity: Not on file  ?Stress: Not on file  ?Social Connections: Not on file  ?Intimate Partner Violence: Not on file  ? ? ?Family History  ?Problem Relation Age of Onset  ? Dementia Mother   ? CVA Father   ? ? ?Vitals:  ? 11/12/21 1430  ?BP: 130/90  ?Pulse: 75  ?SpO2: 99%  ?Weight: 88 kg (194 lb)  ? ?Wt Readings from Last 3 Encounters:  ?11/12/21 88 kg (194 lb)  ?10/31/21 85.3 kg (188 lb)  ?10/03/21 86.1 kg (189 lb 12.8 oz)  ? ?PHYSICAL EXAM: ?General: NAD ?Neck: No JVD, no thyromegaly or thyroid nodule.  ?Lungs: Clear to auscultation bilaterally with normal respiratory effort. ?CV: Nondisplaced PMI.  Heart regular S1/S2, no S3/S4, no murmur.  No peripheral edema.  No carotid bruit.  Normal pedal pulses.  ?Abdomen: Soft, nontender, no hepatosplenomegaly, no distention.  ?Skin: Intact without lesions or rashes.  ?Neurologic: Alert and oriented x 3.  ?Psych: Normal affect. ?Extremities: No clubbing or cyanosis.  ?HEENT: Normal.  ? ?ASSESSMENT & PLAN: ?1. Chronic systolic CHF: Ischemic cardiomyopathy.  Echo (1/23) with EF 30-35%, moderate MR, normal RV.  Echo today showed EF 30-35% with mildly decreased RV function, IVC normal. NYHA class I-II, not volume overloaded on exam.  ?- Continue Entresto 49/51 bid.  ?- Increase spironolactone to 25 mg daily.  BMET today and again in 10 days.   ?- Continue empagliflozin ?- Increase Toprol XL to 25 mg daily.  ?- EF is persistently low post-MI, will refer to EP for ICD.  Not CRT candidate with narrow QRS.   ?2.  CAD: STEMI in 1/23 with 99% pLAD and CTO RCA, he had DES to LAD. No chest pain.  ?- Continue ASA 81 and ticagrelor.  ?- Continue statin.  ?- Cardiac rehab referral to [facility].  ?3. Type 2 diabetes: Per  PCP.  ?4. Tobacco Abuse: Congratulated again on quitting.  ?5. CKD Stage IIIa: BMET today.  ? ?Followup 2 months.  ? ?Loralie Champagne ?11/13/2021 ? ?

## 2021-11-21 ENCOUNTER — Telehealth (HOSPITAL_COMMUNITY): Payer: Self-pay

## 2021-11-21 NOTE — Telephone Encounter (Signed)
Lmtrc, letter mailed to address on file x3rd attempt.  ?

## 2021-11-21 NOTE — Telephone Encounter (Signed)
-----   Message from Larey Dresser, MD sent at 11/12/2021  4:25 PM EDT ----- ?He will need to follow a low K diet and repeat BMET in 1 week after increasing spironolactone as K is high normal today.  Make sure no K supplement.  ?

## 2021-11-26 ENCOUNTER — Encounter (HOSPITAL_COMMUNITY): Payer: Self-pay

## 2021-11-26 ENCOUNTER — Encounter (HOSPITAL_COMMUNITY)
Admission: RE | Admit: 2021-11-26 | Discharge: 2021-11-26 | Disposition: A | Discharge status: Home / Self Care | Payer: Medicare Other | Source: Ambulatory Visit | Attending: Cardiology | Admitting: Cardiology

## 2021-11-26 VITALS — BP 100/68 | HR 81 | Ht 71.0 in | Wt 183.2 lb

## 2021-11-26 DIAGNOSIS — Z955 Presence of coronary angioplasty implant and graft: Secondary | ICD-10-CM | POA: Diagnosis not present

## 2021-11-26 DIAGNOSIS — I213 ST elevation (STEMI) myocardial infarction of unspecified site: Secondary | ICD-10-CM | POA: Insufficient documentation

## 2021-11-26 NOTE — Progress Notes (Signed)
Cardiac Individual Treatment Plan ? ?Patient Details  ?Name: Jose Clark ?MRN: [id number] ?Date of Birth: 02/29/1951 ?Referring Provider:   ?Flowsheet Row CARDIAC REHAB PHASE II ORIENTATION from 11/26/2021 in [place]  ?Referring Provider Dr. Aundra Dubin  ? ?  ? ? ?Initial Encounter Date:  ?Flowsheet Row CARDIAC REHAB PHASE II ORIENTATION from 11/26/2021 in [place]  ?Date 11/26/21  ? ?  ? ? ?Visit Diagnosis: ST elevation myocardial infarction (STEMI), unspecified artery ([place]) ? ?Status post coronary artery stent placement ? ?Patient's Home Medications on Admission: ? ?Current Outpatient Medications:  ?  aspirin EC 81 MG tablet, Take 1 tablet (81 mg total) by mouth daily. Swallow whole., Disp: 90 tablet, Rfl: 3 ?  atorvastatin (LIPITOR) 80 MG tablet, Take 1 tablet (80 mg total) by mouth daily., Disp: 90 tablet, Rfl: 3 ?  empagliflozin (JARDIANCE) 25 MG TABS tablet, Take 25 mg by mouth daily., Disp: , Rfl:  ?  insulin glargine (LANTUS) 100 unit/mL SOPN, Inject 40 Units into the skin at bedtime., Disp: , Rfl:  ?  insulin NPH-insulin regular (NOVOLIN 70/30) (70-30) 100 UNIT/ML injection, Inject 10-25 Units into the skin daily as needed (CBG >180)., Disp: , Rfl:  ?  metoprolol succinate (TOPROL-XL) 25 MG 24 hr tablet, Take 25 mg by mouth daily., Disp: , Rfl:  ?  nitroGLYCERIN (NITROSTAT) 0.4 MG SL tablet, Place 1 tablet (0.4 mg total) under the tongue every 5 (five) minutes as needed., Disp: 25 tablet, Rfl: 12 ?  RYBELSUS 7 MG TABS, Take 1 tablet by mouth daily., Disp: , Rfl:  ?  sacubitril-valsartan (ENTRESTO) 49-51 MG, Take 1 tablet by mouth 2 (two) times daily., Disp: 180 tablet, Rfl: 3 ?  spironolactone (ALDACTONE) 25 MG tablet, Take 1 tablet (25 mg total) by mouth daily., Disp: 90 tablet, Rfl: 3 ?  ticagrelor (BRILINTA) 90 MG TABS tablet, Take 1 tablet (90 mg total) by mouth 2 (two) times daily., Disp: 180 tablet, Rfl: 3 ?  furosemide (LASIX) 20 MG tablet, Take 1 tablet (20  mg total) by mouth daily as needed for fluid or edema. (Patient not taking: Reported on 11/25/2021), Disp: 30 tablet, Rfl: 2 ?  metoprolol succinate (TOPROL-XL) 50 MG 24 hr tablet, Take 1 tablet (50 mg total) by mouth daily., Disp: 90 tablet, Rfl: 3 ? ?Past Medical History: ?Past Medical History:  ?Diagnosis Date  ? CAD (coronary artery disease)   ? Chronic kidney disease, stage 3a ([place])   ? Diabetes ([place])   ? x 20 yrs  ? Diabetes mellitus   ? Heart disease   ? Hepatitis C   ? HFrEF (heart failure with reduced ejection fraction) ([place])   ? Ischemic cardiomyopathy   ? Mitral regurgitation   ? Tobacco abuse   ? ? ?Tobacco Use: ?Social History  ? ?Tobacco Use  ?Smoking Status Former  ? Packs/day: 0.50  ? Years: 20.00  ? Pack years: 10.00  ? Types: Cigarettes  ? Quit date: 08/04/2020  ? Years since quitting: 1.3  ?Smokeless Tobacco Never  ?Tobacco Comments  ? Quit upon admission to hospital.  ? ? ?Labs: ?Review Flowsheet   ? ?  ?  Latest Ref Rng & Units 08/05/2021 08/06/2021 09/17/2021  ?Labs for ITP Cardiac and Pulmonary Rehab  ?Cholestrol 0 - 200 mg/dL  180   147    ?LDL (calc) 0 - 99 mg/dL  110   75    ?Direct LDL 0 - 99 mg/dL   65.4    ?  HDL-C >40 mg/dL  51   56    ?Trlycerides <150 mg/dL  96   79    ?Hemoglobin A1c 4.8 - 5.6 % 7.8      ?  ? ? Multiple values from one day are sorted in reverse-chronological order  ?  ?  ? ? ?Capillary Blood Glucose: ?Lab Results  ?Component Value Date  ? GLUCAP 70 08/06/2021  ? GLUCAP 155 (H) 08/05/2021  ? GLUCAP 141 (H) 08/05/2021  ? GLUCAP 110 (H) 08/05/2021  ? GLUCAP 84 08/05/2021  ? ? POCT Glucose   ? ? [place] Name 11/26/21 8676  ?  ?  ?  ?  ?  ? POCT Blood Glucose  ? Pre-Exercise 138 mg/dL      ? ?  ?  ? ?  ? ? ?Exercise Target Goals: ?Exercise Program Goal: ?Individual exercise prescription set using results from initial 6 min walk test and THRR while considering  patient?s activity barriers and safety.  ? ?Exercise Prescription Goal: ?Starting with aerobic activity 30 plus minutes a  day, 3 days per week for initial exercise prescription. Provide home exercise prescription and guidelines that participant acknowledges understanding prior to discharge. ? ?Activity Barriers & Risk Stratification: ? Activity Barriers & Cardiac Risk Stratification - 11/26/21 0850   ? ?  ? Activity Barriers & Cardiac Risk Stratification  ? Activity Barriers Decreased Ventricular Function;Other (comment)   ? Comments bunion on right foot   ? Cardiac Risk Stratification High   ? ?  ?  ? ?  ? ? ?6 Minute Walk: ? 6 Minute Walk   ? ? [place] Name 11/26/21 1013  ?  ?  ?  ? 6 Minute Walk  ? Phase Initial    ? Distance 1500 feet    ? Walk Time 6 minutes    ? # of Rest Breaks 0    ? MPH 2.84    ? METS 3.32    ? RPE 11    ? VO2 Peak 11.63    ? Symptoms No    ? Resting HR 81 bpm    ? Resting BP 100/68    ? Resting Oxygen Saturation  100 %    ? Exercise Oxygen Saturation  during 6 min walk 98 %    ? Max Ex. HR 93 bpm    ? Max Ex. BP 116/70    ? 2 Minute Post BP 100/66    ? ?  ?  ? ?  ? ? ?Oxygen Initial Assessment: ? ? ?Oxygen Re-Evaluation: ? ? ?Oxygen Discharge (Final Oxygen Re-Evaluation): ? ? ?Initial Exercise Prescription: ? Initial Exercise Prescription - 11/26/21 1000   ? ?  ? Date of Initial Exercise RX and Referring Provider  ? Date 11/26/21   ? Referring Provider Dr. Aundra Dubin   ? Expected Discharge Date 02/21/22   ?  ? Treadmill  ? MPH 1.5   ? Grade 0   ? Minutes 17   ?  ? Recumbant Elliptical  ? Level 1   ? RPM 40   ? Minutes 22   ?  ? Prescription Details  ? Frequency (times per week) 3   ? Duration Progress to 30 minutes of continuous aerobic without signs/symptoms of physical distress   ?  ? Intensity  ? THRR 40-80% of Max Heartrate 60-121   ? Ratings of Perceived Exertion 11-13   ?  ? Resistance Training  ? Training Prescription Yes   ? Weight 3   ?  Reps 10-15   ? ?  ?  ? ?  ? ? ?Perform Capillary Blood Glucose checks as needed. ? ?Exercise Prescription Changes: ? ? ?Exercise Comments: ? ? ?Exercise Goals and Review: ? ?  Exercise Goals   ? ? [place] Name 11/26/21 1016  ?  ?  ?  ?  ?  ? Exercise Goals  ? Increase Physical Activity Yes      ? Intervention Provide advice, education, support and counseling about physical activity/exercise needs.;Develop an individualized exercise prescription for aerobic and resistive training based on initial evaluation findings, risk stratification, comorbidities and participant's personal goals.      ? Expected Outcomes Short Term: Attend rehab on a regular basis to increase amount of physical activity.;Long Term: Add in home exercise to make exercise part of routine and to increase amount of physical activity.;Long Term: Exercising regularly at least 3-5 days a week.      ? Increase Strength and Stamina Yes      ? Intervention Provide advice, education, support and counseling about physical activity/exercise needs.;Develop an individualized exercise prescription for aerobic and resistive training based on initial evaluation findings, risk stratification, comorbidities and participant's personal goals.      ? Expected Outcomes Short Term: Increase workloads from initial exercise prescription for resistance, speed, and METs.;Short Term: Perform resistance training exercises routinely during rehab and add in resistance training at home;Long Term: Improve cardiorespiratory fitness, muscular endurance and strength as measured by increased METs and functional capacity (6MWT)      ? Able to understand and use rate of perceived exertion (RPE) scale Yes      ? Intervention Provide education and explanation on how to use RPE scale      ? Expected Outcomes Short Term: Able to use RPE daily in rehab to express subjective intensity level;Long Term:  Able to use RPE to guide intensity level when exercising independently      ? Knowledge and understanding of Target Heart Rate Range (THRR) Yes      ? Intervention Provide education and explanation of THRR including how the numbers were predicted and where they are  located for reference      ? Expected Outcomes Short Term: Able to state/look up THRR;Long Term: Able to use THRR to govern intensity when exercising independently;Short Term: Able to use daily as guideline

## 2021-11-29 DIAGNOSIS — E1165 Type 2 diabetes mellitus with hyperglycemia: Secondary | ICD-10-CM | POA: Diagnosis not present

## 2021-12-02 ENCOUNTER — Encounter (HOSPITAL_COMMUNITY)
Admission: RE | Admit: 2021-12-02 | Discharge: 2021-12-02 | Disposition: A | Discharge status: Home / Self Care | Payer: Medicare Other | Source: Ambulatory Visit | Attending: Cardiology | Admitting: Cardiology

## 2021-12-02 DIAGNOSIS — Z955 Presence of coronary angioplasty implant and graft: Secondary | ICD-10-CM | POA: Diagnosis not present

## 2021-12-02 DIAGNOSIS — I213 ST elevation (STEMI) myocardial infarction of unspecified site: Secondary | ICD-10-CM | POA: Diagnosis not present

## 2021-12-02 NOTE — Progress Notes (Signed)
Daily Session Note ? ?Patient Details  ?Name: Jose Clark ?MRN: [id number] ?Date of Birth: 1950-11-14 ?Referring Provider:   ?Flowsheet Row CARDIAC REHAB PHASE II ORIENTATION from 11/26/2021 in [place]  ?Referring Provider Dr. Aundra Dubin  ? ?  ? ? ?Encounter Date: 12/02/2021 ? ?Check In: ? Session Check In - 12/02/21 0815   ? ?  ? Check-In  ? Supervising physician immediately available to respond to emergencies [facility] MD immediately available   ? Physician(s) Dr Harl Bowie   ? Location [facility]   ? Staff Present Hoy Register, MS, ACSM-CEP, Exercise Physiologist;Heather Otho Ket, BS, Exercise Physiologist   ? Virtual Visit No   ? Medication changes reported     No   ? Tobacco Cessation No Change   ? Warm-up and Cool-down Performed as group-led instruction   ? Resistance Training Performed Yes   ? VAD Patient? No   ? PAD/SET Patient? No   ?  ? Pain Assessment  ? Currently in Pain? No/denies   ? Multiple Pain Sites No   ? ?  ?  ? ?  ? ? ?Capillary Blood Glucose: ?No results found for this or any previous visit (from the past 24 hour(s)). ? ? ? ?Social History  ? ?Tobacco Use  ?Smoking Status Former  ? Packs/day: 0.50  ? Years: 20.00  ? Pack years: 10.00  ? Types: Cigarettes  ? Quit date: 08/04/2020  ? Years since quitting: 1.3  ?Smokeless Tobacco Never  ?Tobacco Comments  ? Quit upon admission to hospital.  ? ? ?Goals Met:  ?Independence with exercise equipment ?Exercise tolerated well ?No report of concerns or symptoms today ?Strength training completed today ? ?Goals Unmet:  ?Not Applicable ? ?Comments: checkout at [place]. ? ? ?Dr. Carlyle Dolly is Medical Director for [place] ?

## 2021-12-04 ENCOUNTER — Encounter (HOSPITAL_COMMUNITY)
Admission: RE | Admit: 2021-12-04 | Discharge: 2021-12-04 | Disposition: A | Discharge status: Home / Self Care | Payer: Medicare Other | Source: Ambulatory Visit | Attending: Cardiology | Admitting: Cardiology

## 2021-12-04 DIAGNOSIS — I213 ST elevation (STEMI) myocardial infarction of unspecified site: Secondary | ICD-10-CM

## 2021-12-04 DIAGNOSIS — Z955 Presence of coronary angioplasty implant and graft: Secondary | ICD-10-CM | POA: Diagnosis not present

## 2021-12-04 NOTE — Progress Notes (Signed)
Daily Session Note ? ?Patient Details  ?Name: Jose Clark ?MRN: [id number] ?Date of Birth: 27-Feb-1951 ?Referring Provider:   ?Flowsheet Row CARDIAC REHAB PHASE II ORIENTATION from 11/26/2021 in [place]  ?Referring Provider Dr. Aundra Dubin  ? ?  ? ? ?Encounter Date: 12/04/2021 ? ?Check In: ? Session Check In - 12/04/21 0811   ? ?  ? Check-In  ? Supervising physician immediately available to respond to emergencies [facility] MD immediately available   ? Physician(s) Dr Harl Bowie   ? Location [facility]   ? Staff Present Geanie Cooley, RN;Heather Otho Ket, BS, Exercise Physiologist;Dalton Fletcher, MS, ACSM-CEP, Exercise Physiologist   ? Virtual Visit No   ? Medication changes reported     No   ? Fall or balance concerns reported    No   ? Tobacco Cessation No Change   ? Warm-up and Cool-down Performed as group-led instruction   ? Resistance Training Performed Yes   ? VAD Patient? No   ? PAD/SET Patient? No   ?  ? Pain Assessment  ? Currently in Pain? No/denies   ? Multiple Pain Sites No   ? ?  ?  ? ?  ? ? ?Capillary Blood Glucose: ?No results found for this or any previous visit (from the past 24 hour(s)). ? ? ? ?Social History  ? ?Tobacco Use  ?Smoking Status Former  ? Packs/day: 0.50  ? Years: 20.00  ? Pack years: 10.00  ? Types: Cigarettes  ? Quit date: 08/04/2020  ? Years since quitting: 1.3  ?Smokeless Tobacco Never  ?Tobacco Comments  ? Quit upon admission to hospital.  ? ? ?Goals Met:  ?Independence with exercise equipment ?Exercise tolerated well ?No report of concerns or symptoms today ?Strength training completed today ? ?Goals Unmet:  ?Not Applicable ? ?Comments: check out @ 9:15am ? ? ?Dr. Carlyle Dolly is Medical Director for [place] ?

## 2021-12-06 ENCOUNTER — Encounter (HOSPITAL_COMMUNITY)
Admission: RE | Admit: 2021-12-06 | Discharge: 2021-12-06 | Disposition: A | Discharge status: Home / Self Care | Payer: Medicare Other | Source: Ambulatory Visit | Attending: Cardiology | Admitting: Cardiology

## 2021-12-06 DIAGNOSIS — Z955 Presence of coronary angioplasty implant and graft: Secondary | ICD-10-CM

## 2021-12-06 DIAGNOSIS — I213 ST elevation (STEMI) myocardial infarction of unspecified site: Secondary | ICD-10-CM

## 2021-12-06 NOTE — Progress Notes (Signed)
Daily Session Note  Patient Details  Name: SIMRAN MANNIS MRN: [id number] Date of Birth: September 20, 1950 Referring Provider:   Flowsheet Row CARDIAC REHAB PHASE II ORIENTATION from 11/26/2021 in [place]  Referring Provider Dr. Aundra Dubin       Encounter Date: 12/06/2021  Check In:  Session Check In - 12/06/21 0807       Check-In   Supervising physician immediately available to respond to emergencies [facility] MD immediately available    Physician(s) Dr Vanita Panda    Location [facility]    Staff Present Geanie Cooley, RN;Heather Otho Ket, BS, Exercise Physiologist;Nissa Stannard Hassell Done, RN, BSN    Virtual Visit No    Medication changes reported     No    Fall or balance concerns reported    No    Tobacco Cessation No Change    Warm-up and Cool-down Performed as group-led instruction    Resistance Training Performed Yes    VAD Patient? No    PAD/SET Patient? No      Pain Assessment   Currently in Pain? No/denies    Multiple Pain Sites No             Capillary Blood Glucose: No results found for this or any previous visit (from the past 24 hour(s)).    Social History   Tobacco Use  Smoking Status Former   Packs/day: 0.50   Years: 20.00   Pack years: 10.00   Types: Cigarettes   Quit date: 08/04/2020   Years since quitting: 1.3  Smokeless Tobacco Never  Tobacco Comments   Quit upon admission to hospital.    Goals Met:  Independence with exercise equipment Exercise tolerated well No report of concerns or symptoms today Strength training completed today  Goals Unmet:  Not Applicable  Comments: Check out at 0915.   Dr. Carlyle Dolly is Medical Director for [facility] Cardiac Rehab

## 2021-12-09 ENCOUNTER — Ambulatory Visit: Payer: Medicare Other | Admitting: Podiatry

## 2021-12-09 ENCOUNTER — Encounter (HOSPITAL_COMMUNITY)
Admission: RE | Admit: 2021-12-09 | Discharge: 2021-12-09 | Disposition: A | Discharge status: Home / Self Care | Payer: Medicare Other | Source: Ambulatory Visit | Attending: Cardiology | Admitting: Cardiology

## 2021-12-09 VITALS — Wt 184.5 lb

## 2021-12-09 DIAGNOSIS — Z955 Presence of coronary angioplasty implant and graft: Secondary | ICD-10-CM

## 2021-12-09 DIAGNOSIS — I213 ST elevation (STEMI) myocardial infarction of unspecified site: Secondary | ICD-10-CM

## 2021-12-09 NOTE — Progress Notes (Signed)
Daily Session Note  Patient Details  Name: LYNARD POSTLEWAIT MRN: [id number] Date of Birth: 01/26/1951 Referring Provider:   Flowsheet Row CARDIAC REHAB PHASE II ORIENTATION from 11/26/2021 in [place]  Referring Provider Dr. Aundra Dubin       Encounter Date: 12/09/2021  Check In:  Session Check In - 12/09/21 0809       Check-In   Supervising physician immediately available to respond to emergencies [facility] MD immediately available    Physician(s) Dr Gardiner Rhyme    Location [facility]    Staff Present Aundra Dubin, RN, Joanette Gula, RN, BSN;Heather Otho Ket, BS, Exercise Physiologist    Virtual Visit No    Medication changes reported     No    Fall or balance concerns reported    No    Tobacco Cessation No Change    Warm-up and Cool-down Performed as group-led instruction    Resistance Training Performed Yes    VAD Patient? No    PAD/SET Patient? No      Pain Assessment   Currently in Pain? No/denies    Multiple Pain Sites No             Capillary Blood Glucose: No results found for this or any previous visit (from the past 24 hour(s)).    Social History   Tobacco Use  Smoking Status Former   Packs/day: 0.50   Years: 20.00   Pack years: 10.00   Types: Cigarettes   Quit date: 08/04/2020   Years since quitting: 1.3  Smokeless Tobacco Never  Tobacco Comments   Quit upon admission to hospital.    Goals Met:  Independence with exercise equipment Exercise tolerated well No report of concerns or symptoms today Strength training completed today  Goals Unmet:  Not Applicable  Comments: Checkout at 0915.   Dr. Carlyle Dolly is Medical Director for [facility] Cardiac Rehab

## 2021-12-11 ENCOUNTER — Encounter (HOSPITAL_COMMUNITY)
Admission: RE | Admit: 2021-12-11 | Discharge: 2021-12-11 | Disposition: A | Discharge status: Home / Self Care | Payer: Medicare Other | Source: Ambulatory Visit | Attending: Cardiology | Admitting: Cardiology

## 2021-12-11 DIAGNOSIS — Z955 Presence of coronary angioplasty implant and graft: Secondary | ICD-10-CM | POA: Diagnosis not present

## 2021-12-11 DIAGNOSIS — E785 Hyperlipidemia, unspecified: Secondary | ICD-10-CM | POA: Diagnosis not present

## 2021-12-11 DIAGNOSIS — E1165 Type 2 diabetes mellitus with hyperglycemia: Secondary | ICD-10-CM | POA: Diagnosis not present

## 2021-12-11 DIAGNOSIS — I213 ST elevation (STEMI) myocardial infarction of unspecified site: Secondary | ICD-10-CM | POA: Diagnosis not present

## 2021-12-11 DIAGNOSIS — I10 Essential (primary) hypertension: Secondary | ICD-10-CM | POA: Diagnosis not present

## 2021-12-11 NOTE — Progress Notes (Signed)
Cardiac Individual Treatment Plan  Patient Details  Name: Jose Clark MRN: [id number] Date of Birth: 05-11-1951 Referring Provider:   Flowsheet Row CARDIAC REHAB PHASE II ORIENTATION from 11/26/2021 in [place]  Referring Provider Dr. Aundra Dubin       Initial Encounter Date:  Flowsheet Row CARDIAC REHAB PHASE II ORIENTATION from 11/26/2021 in [place]  Date 11/26/21       Visit Diagnosis: ST elevation myocardial infarction (STEMI), unspecified artery [facility])  Status post coronary artery stent placement  Patient's Home Medications on Admission:  Current Outpatient Medications:    aspirin EC 81 MG tablet, Take 1 tablet (81 mg total) by mouth daily. Swallow whole., Disp: 90 tablet, Rfl: 3   atorvastatin (LIPITOR) 80 MG tablet, Take 1 tablet (80 mg total) by mouth daily., Disp: 90 tablet, Rfl: 3   empagliflozin (JARDIANCE) 25 MG TABS tablet, Take 25 mg by mouth daily., Disp: , Rfl:    furosemide (LASIX) 20 MG tablet, Take 1 tablet (20 mg total) by mouth daily as needed for fluid or edema. (Patient not taking: Reported on 11/25/2021), Disp: 30 tablet, Rfl: 2   insulin glargine (LANTUS) 100 unit/mL SOPN, Inject 40 Units into the skin at bedtime., Disp: , Rfl:    insulin NPH-insulin regular (NOVOLIN 70/30) (70-30) 100 UNIT/ML injection, Inject 10-25 Units into the skin daily as needed (CBG >180)., Disp: , Rfl:    metoprolol succinate (TOPROL-XL) 25 MG 24 hr tablet, Take 25 mg by mouth daily., Disp: , Rfl:    metoprolol succinate (TOPROL-XL) 50 MG 24 hr tablet, Take 1 tablet (50 mg total) by mouth daily., Disp: 90 tablet, Rfl: 3   nitroGLYCERIN (NITROSTAT) 0.4 MG SL tablet, Place 1 tablet (0.4 mg total) under the tongue every 5 (five) minutes as needed., Disp: 25 tablet, Rfl: 12   RYBELSUS 7 MG TABS, Take 1 tablet by mouth daily., Disp: , Rfl:    sacubitril-valsartan (ENTRESTO) 49-51 MG, Take 1 tablet by mouth 2 (two) times daily., Disp: 180 tablet,  Rfl: 3   spironolactone (ALDACTONE) 25 MG tablet, Take 1 tablet (25 mg total) by mouth daily., Disp: 90 tablet, Rfl: 3   ticagrelor (BRILINTA) 90 MG TABS tablet, Take 1 tablet (90 mg total) by mouth 2 (two) times daily., Disp: 180 tablet, Rfl: 3  Past Medical History: Past Medical History:  Diagnosis Date   CAD (coronary artery disease)    Chronic kidney disease, stage 3a (HCC)    Diabetes ([place])    x 20 yrs   Diabetes mellitus    Heart disease    Hepatitis C    HFrEF (heart failure with reduced ejection fraction) (HCC)    Ischemic cardiomyopathy    Mitral regurgitation    Tobacco abuse     Tobacco Use: Social History   Tobacco Use  Smoking Status Former   Packs/day: 0.50   Years: 20.00   Pack years: 10.00   Types: Cigarettes   Quit date: 08/04/2020   Years since quitting: 1.3  Smokeless Tobacco Never  Tobacco Comments   Quit upon admission to hospital.    Labs: Review Flowsheet        Latest Ref Rng & Units 08/05/2021 08/06/2021 09/17/2021  Labs for ITP Cardiac and Pulmonary Rehab  Cholestrol 0 - 200 mg/dL  180   147    LDL (calc) 0 - 99 mg/dL  110   75    Direct LDL 0 - 99 mg/dL   65.4  HDL-C >40 mg/dL  51   56    Trlycerides <150 mg/dL  96   79    Hemoglobin A1c 4.8 - 5.6 % 7.8              Capillary Blood Glucose: Lab Results  Component Value Date   GLUCAP 70 08/06/2021   GLUCAP 155 (H) 08/05/2021   GLUCAP 141 (H) 08/05/2021   GLUCAP 110 (H) 08/05/2021   GLUCAP 84 08/05/2021    POCT Glucose     Row Name 11/26/21 0917             POCT Blood Glucose   Pre-Exercise 138 mg/dL                Exercise Target Goals: Exercise Program Goal: Individual exercise prescription set using results from initial 6 min walk test and THRR while considering  patient's activity barriers and safety.   Exercise Prescription Goal: Starting with aerobic activity 30 plus minutes a day, 3 days per week for initial exercise prescription. Provide home exercise  prescription and guidelines that participant acknowledges understanding prior to discharge.  Activity Barriers & Risk Stratification:  Activity Barriers & Cardiac Risk Stratification - 11/26/21 0850       Activity Barriers & Cardiac Risk Stratification   Activity Barriers Decreased Ventricular Function;Other (comment)    Comments bunion on right foot    Cardiac Risk Stratification High             6 Minute Walk:  6 Minute Walk     Row Name 11/26/21 1013         6 Minute Walk   Phase Initial     Distance 1500 feet     Walk Time 6 minutes     # of Rest Breaks 0     MPH 2.84     METS 3.32     RPE 11     VO2 Peak 11.63     Symptoms No     Resting HR 81 bpm     Resting BP 100/68     Resting Oxygen Saturation  100 %     Exercise Oxygen Saturation  during 6 min walk 98 %     Max Ex. HR 93 bpm     Max Ex. BP 116/70     2 Minute Post BP 100/66              Oxygen Initial Assessment:   Oxygen Re-Evaluation:   Oxygen Discharge (Final Oxygen Re-Evaluation):   Initial Exercise Prescription:  Initial Exercise Prescription - 11/26/21 1000       Date of Initial Exercise RX and Referring Provider   Date 11/26/21    Referring Provider Dr. Aundra Dubin    Expected Discharge Date 02/21/22      Treadmill   MPH 1.5    Grade 0    Minutes 17      Recumbant Elliptical   Level 1    RPM 40    Minutes 22      Prescription Details   Frequency (times per week) 3    Duration Progress to 30 minutes of continuous aerobic without signs/symptoms of physical distress      Intensity   THRR 40-80% of Max Heartrate 60-121    Ratings of Perceived Exertion 11-13      Resistance Training   Training Prescription Yes    Weight 3    Reps 10-15  Perform Capillary Blood Glucose checks as needed.  Exercise Prescription Changes:   Exercise Prescription Changes     Row Name 12/09/21 1000             Response to Exercise   Blood Pressure (Admit) 112/60        Blood Pressure (Exercise) 112/64       Blood Pressure (Exit) 100/62       Heart Rate (Admit) 80 bpm       Heart Rate (Exercise) 118 bpm       Heart Rate (Exit) 89 bpm       Rating of Perceived Exertion (Exercise) 11       Duration Continue with 30 min of aerobic exercise without signs/symptoms of physical distress.       Intensity THRR unchanged         Progression   Progression Continue to progress workloads to maintain intensity without signs/symptoms of physical distress.         Resistance Training   Training Prescription Yes       Weight 4       Reps 10-15       Time 10 Minutes         Treadmill   MPH 1.5       Grade 0       Minutes 17       METs 2.15         Recumbant Elliptical   Level 1       RPM 64       Minutes 22       METs 4.3                Exercise Comments:   Exercise Goals and Review:   Exercise Goals     Row Name 11/26/21 1016 12/09/21 1033           Exercise Goals   Increase Physical Activity Yes Yes      Intervention Provide advice, education, support and counseling about physical activity/exercise needs.;Develop an individualized exercise prescription for aerobic and resistive training based on initial evaluation findings, risk stratification, comorbidities and participant's personal goals. Provide advice, education, support and counseling about physical activity/exercise needs.;Develop an individualized exercise prescription for aerobic and resistive training based on initial evaluation findings, risk stratification, comorbidities and participant's personal goals.      Expected Outcomes Short Term: Attend rehab on a regular basis to increase amount of physical activity.;Long Term: Add in home exercise to make exercise part of routine and to increase amount of physical activity.;Long Term: Exercising regularly at least 3-5 days a week. Short Term: Attend rehab on a regular basis to increase amount of physical activity.;Long Term: Add in home  exercise to make exercise part of routine and to increase amount of physical activity.;Long Term: Exercising regularly at least 3-5 days a week.      Increase Strength and Stamina Yes Yes      Intervention Provide advice, education, support and counseling about physical activity/exercise needs.;Develop an individualized exercise prescription for aerobic and resistive training based on initial evaluation findings, risk stratification, comorbidities and participant's personal goals. Provide advice, education, support and counseling about physical activity/exercise needs.;Develop an individualized exercise prescription for aerobic and resistive training based on initial evaluation findings, risk stratification, comorbidities and participant's personal goals.      Expected Outcomes Short Term: Increase workloads from initial exercise prescription for resistance, speed, and METs.;Short Term: Perform resistance training exercises routinely during rehab and  add in resistance training at home;Long Term: Improve cardiorespiratory fitness, muscular endurance and strength as measured by increased METs and functional capacity (6MWT) Short Term: Increase workloads from initial exercise prescription for resistance, speed, and METs.;Short Term: Perform resistance training exercises routinely during rehab and add in resistance training at home;Long Term: Improve cardiorespiratory fitness, muscular endurance and strength as measured by increased METs and functional capacity (6MWT)      Able to understand and use rate of perceived exertion (RPE) scale Yes Yes      Intervention Provide education and explanation on how to use RPE scale Provide education and explanation on how to use RPE scale      Expected Outcomes Short Term: Able to use RPE daily in rehab to express subjective intensity level;Long Term:  Able to use RPE to guide intensity level when exercising independently Short Term: Able to use RPE daily in rehab to express  subjective intensity level;Long Term:  Able to use RPE to guide intensity level when exercising independently      Knowledge and understanding of Target Heart Rate Range (THRR) Yes Yes      Intervention Provide education and explanation of THRR including how the numbers were predicted and where they are located for reference Provide education and explanation of THRR including how the numbers were predicted and where they are located for reference      Expected Outcomes Short Term: Able to state/look up THRR;Long Term: Able to use THRR to govern intensity when exercising independently;Short Term: Able to use daily as guideline for intensity in rehab Short Term: Able to state/look up THRR;Long Term: Able to use THRR to govern intensity when exercising independently;Short Term: Able to use daily as guideline for intensity in rehab      Able to check pulse independently Yes Yes      Intervention Provide education and demonstration on how to check pulse in carotid and radial arteries.;Review the importance of being able to check your own pulse for safety during independent exercise Provide education and demonstration on how to check pulse in carotid and radial arteries.;Review the importance of being able to check your own pulse for safety during independent exercise      Expected Outcomes Short Term: Able to explain why pulse checking is important during independent exercise;Long Term: Able to check pulse independently and accurately Short Term: Able to explain why pulse checking is important during independent exercise;Long Term: Able to check pulse independently and accurately      Understanding of Exercise Prescription Yes Yes      Intervention Provide education, explanation, and written materials on patient's individual exercise prescription Provide education, explanation, and written materials on patient's individual exercise prescription      Expected Outcomes Short Term: Able to explain program exercise  prescription;Long Term: Able to explain home exercise prescription to exercise independently Short Term: Able to explain program exercise prescription;Long Term: Able to explain home exercise prescription to exercise independently               Exercise Goals Re-Evaluation :  Exercise Goals Re-Evaluation     Row Name 12/09/21 1034             Exercise Goal Re-Evaluation   Exercise Goals Review Increase Physical Activity;Increase Strength and Stamina;Able to understand and use rate of perceived exertion (RPE) scale;Knowledge and understanding of Target Heart Rate Range (THRR);Able to check pulse independently;Understanding of Exercise Prescription       Comments Pt has completed 5 sessions  of cardiac rehab. He is motivated during class and eagar to improve his everyday activities. He is currently exercsising at 4.3 METs on the ellp. Will continue to montior and progress as able.       Expected Outcomes Through exercise at home and at rehab, the patient will meet their stated goals.                 Discharge Exercise Prescription (Final Exercise Prescription Changes):  Exercise Prescription Changes - 12/09/21 1000       Response to Exercise   Blood Pressure (Admit) 112/60    Blood Pressure (Exercise) 112/64    Blood Pressure (Exit) 100/62    Heart Rate (Admit) 80 bpm    Heart Rate (Exercise) 118 bpm    Heart Rate (Exit) 89 bpm    Rating of Perceived Exertion (Exercise) 11    Duration Continue with 30 min of aerobic exercise without signs/symptoms of physical distress.    Intensity THRR unchanged      Progression   Progression Continue to progress workloads to maintain intensity without signs/symptoms of physical distress.      Resistance Training   Training Prescription Yes    Weight 4    Reps 10-15    Time 10 Minutes      Treadmill   MPH 1.5    Grade 0    Minutes 17    METs 2.15      Recumbant Elliptical   Level 1    RPM 64    Minutes 22    METs 4.3              Nutrition:  Target Goals: Understanding of nutrition guidelines, daily intake of sodium '1500mg'$ , cholesterol '200mg'$ , calories 30% from fat and 7% or less from saturated fats, daily to have 5 or more servings of fruits and vegetables.  Biometrics:  Pre Biometrics - 11/26/21 1016       Pre Biometrics   Height '5\' 11"'$  (1.803 m)    Weight 83.1 kg    Waist Circumference 38.5 inches    Hip Circumference 38 inches    Waist to Hip Ratio 1.01 %    BMI (Calculated) 25.56    Triceps Skinfold 10 mm    % Body Fat 24.2 %    Grip Strength 46.5 kg    Flexibility 12 in    Single Leg Stand 11 seconds              Nutrition Therapy Plan and Nutrition Goals:  Nutrition Therapy & Goals - 11/26/21 0823       Intervention Plan   Intervention Nutrition handout(s) given to patient.    Expected Outcomes Short Term Goal: Understand basic principles of dietary content, such as calories, fat, sodium, cholesterol and nutrients.             Nutrition Assessments:  Nutrition Assessments - 11/26/21 0823       MEDFICTS Scores   Pre Score 36            MEDIFICTS Score Key: ?70 Need to make dietary changes  40-70 Heart Healthy Diet ? 40 Therapeutic Level Cholesterol Diet   Picture Your Plate Scores: <52 Unhealthy dietary pattern with much room for improvement. 41-50 Dietary pattern unlikely to meet recommendations for good health and room for improvement. 51-60 More healthful dietary pattern, with some room for improvement.  >60 Healthy dietary pattern, although there may be some specific behaviors that could be improved.  Nutrition Goals Re-Evaluation:   Nutrition Goals Discharge (Final Nutrition Goals Re-Evaluation):   Psychosocial: Target Goals: Acknowledge presence or absence of significant depression and/or stress, maximize coping skills, provide positive support system. Participant is able to verbalize types and ability to use techniques and skills needed for  reducing stress and depression.  Initial Review & Psychosocial Screening:  Initial Psych Review & Screening - 11/26/21 0855       Initial Review   Current issues with None Identified      Family Dynamics   Good Support System? Yes    Comments His support system includes his two daughters.      Barriers   Psychosocial barriers to participate in program There are no identifiable barriers or psychosocial needs.      Screening Interventions   Interventions Encouraged to exercise    Expected Outcomes Long Term goal: The participant improves quality of Life and PHQ9 Scores as seen by post scores and/or verbalization of changes;Short Term goal: Identification and review with participant of any Quality of Life or Depression concerns found by scoring the questionnaire.             Quality of Life Scores:  Quality of Life - 11/26/21 1017       Quality of Life   Select Quality of Life      Quality of Life Scores   Health/Function Pre 26.17 %    Socioeconomic Pre 26.13 %    Psych/Spiritual Pre 23.31 %    Family Pre 30 %    GLOBAL Pre 26 %            Scores of 19 and below usually indicate a poorer quality of life in these areas.  A difference of  2-3 points is a clinically meaningful difference.  A difference of 2-3 points in the total score of the Quality of Life Index has been associated with significant improvement in overall quality of life, self-image, physical symptoms, and general health in studies assessing change in quality of life.  PHQ-9: Review Flowsheet        11/26/2021  Depression screen PHQ 2/9  Decreased Interest 1  Down, Depressed, Hopeless 0  PHQ - 2 Score 1  Altered sleeping 0  Tired, decreased energy 0  Change in appetite 0  Feeling bad or failure about yourself  0  Trouble concentrating 0  Moving slowly or fidgety/restless 0  Suicidal thoughts 0  PHQ-9 Score 1  Difficult doing work/chores Not difficult at all         Interpretation of Total  Score  Total Score Depression Severity:  1-4 = Minimal depression, 5-9 = Mild depression, 10-14 = Moderate depression, 15-19 = Moderately severe depression, 20-27 = Severe depression   Psychosocial Evaluation and Intervention:  Psychosocial Evaluation - 11/26/21 0918       Psychosocial Evaluation & Interventions   Interventions Encouraged to exercise with the program and follow exercise prescription    Comments Pt has no barriers to participating in CR. He has no identifiable psychosocial issues. He scored a 1 on his PHQ-9 and he relates this to his decreased energy levels since his STEMI. He reports that he has a good support system with his two daughter. He quit smoking 08/03/2021 when he had his STEMI. He would only smoke when he would drink alcohol, and states that he would smoke no more than a quarter of a pack per day. He has also quit drinking, and he states that he has had  no problem giving up alcohol or smoking. His goals while in the program are to decrease his SOB with exertion, and to improve his strength and stamina. He is eager to begin the program. He is interested in setting up an advanced directive, and I have provided the chaplain with his information, so that they can schedule a meeting.    Expected Outcomes Pt will continue to have no identfiable psychosocial issues.    Continue Psychosocial Services  No Follow up required             Psychosocial Re-Evaluation:  Psychosocial Re-Evaluation     Kathleen Name 12/02/21 0903             Psychosocial Re-Evaluation   Current issues with None Identified       Comments Patient is new to the program starting today 5/15. He continues to have no psychosocial barriers identified. We will continue to monitor his progress in the program.       Expected Outcomes Patient will continue to have no psychosocial barriers identified.       Interventions Stress management education;Relaxation education;Encouraged to attend Cardiac  Rehabilitation for the exercise       Continue Psychosocial Services  No Follow up required                Psychosocial Discharge (Final Psychosocial Re-Evaluation):  Psychosocial Re-Evaluation - 12/02/21 0903       Psychosocial Re-Evaluation   Current issues with None Identified    Comments Patient is new to the program starting today 5/15. He continues to have no psychosocial barriers identified. We will continue to monitor his progress in the program.    Expected Outcomes Patient will continue to have no psychosocial barriers identified.    Interventions Stress management education;Relaxation education;Encouraged to attend Cardiac Rehabilitation for the exercise    Continue Psychosocial Services  No Follow up required             Vocational Rehabilitation: Provide vocational rehab assistance to qualifying candidates.   Vocational Rehab Evaluation & Intervention:  Vocational Rehab - 11/26/21 0859       Initial Vocational Rehab Evaluation & Intervention   Assessment shows need for Vocational Rehabilitation No      Vocational Rehab Re-Evaulation   Comments He is retired.             Education: Education Goals: Education classes will be provided on a weekly basis, covering required topics. Participant will state understanding/return demonstration of topics presented.  Learning Barriers/Preferences:  Learning Barriers/Preferences - 11/26/21 0856       Learning Barriers/Preferences   Learning Barriers None    Learning Preferences Audio;Verbal Instruction             Education Topics: Hypertension, Hypertension Reduction -Define heart disease and high blood pressure. Discus how high blood pressure affects the body and ways to reduce high blood pressure.   Exercise and Your Heart -Discuss why it is important to exercise, the FITT principles of exercise, normal and abnormal responses to exercise, and how to exercise safely.   Angina -Discuss  definition of angina, causes of angina, treatment of angina, and how to decrease risk of having angina.   Cardiac Medications -Review what the following cardiac medications are used for, how they affect the body, and side effects that may occur when taking the medications.  Medications include Aspirin, Beta blockers, calcium channel blockers, ACE Inhibitors, angiotensin receptor blockers, diuretics, digoxin, and antihyperlipidemics.   Congestive Heart Failure -  Discuss the definition of CHF, how to live with CHF, the signs and symptoms of CHF, and how keep track of weight and sodium intake.   Heart Disease and Intimacy -Discus the effect sexual activity has on the heart, how changes occur during intimacy as we age, and safety during sexual activity.   Smoking Cessation / COPD -Discuss different methods to quit smoking, the health benefits of quitting smoking, and the definition of COPD. Flowsheet Row CARDIAC REHAB PHASE II EXERCISE from 12/04/2021 in [place]  Date 12/04/21  Educator pb  Instruction Review Code 1- Verbalizes Understanding       Nutrition I: Fats -Discuss the types of cholesterol, what cholesterol does to the heart, and how cholesterol levels can be controlled.   Nutrition II: Labels -Discuss the different components of food labels and how to read food label   Heart Parts/Heart Disease and PAD -Discuss the anatomy of the heart, the pathway of blood circulation through the heart, and these are affected by heart disease.   Stress I: Signs and Symptoms -Discuss the causes of stress, how stress may lead to anxiety and depression, and ways to limit stress.   Stress II: Relaxation -Discuss different types of relaxation techniques to limit stress.   Warning Signs of Stroke / TIA -Discuss definition of a stroke, what the signs and symptoms are of a stroke, and how to identify when someone is having stroke.   Knowledge Questionnaire  Score:  Knowledge Questionnaire Score - 11/26/21 0857       Knowledge Questionnaire Score   Pre Score 26/28             Core Components/Risk Factors/Patient Goals at Admission:  Personal Goals and Risk Factors at Admission - 11/26/21 0901       Core Components/Risk Factors/Patient Goals on Admission   Improve shortness of breath with ADL's Yes    Intervention Provide education, individualized exercise plan and daily activity instruction to help decrease symptoms of SOB with activities of daily living.    Expected Outcomes Short Term: Improve cardiorespiratory fitness to achieve a reduction of symptoms when performing ADLs;Long Term: Be able to perform more ADLs without symptoms or delay the onset of symptoms    Diabetes Yes    Intervention Provide education about signs/symptoms and action to take for hypo/hyperglycemia.;Provide education about proper nutrition, including hydration, and aerobic/resistive exercise prescription along with prescribed medications to achieve blood glucose in normal ranges: Fasting glucose 65-99 mg/dL    Expected Outcomes Short Term: Participant verbalizes understanding of the signs/symptoms and immediate care of hyper/hypoglycemia, proper foot care and importance of medication, aerobic/resistive exercise and nutrition plan for blood glucose control.;Long Term: Attainment of HbA1C < 7%.    Heart Failure Yes    Intervention Provide a combined exercise and nutrition program that is supplemented with education, support and counseling about heart failure. Directed toward relieving symptoms such as shortness of breath, decreased exercise tolerance, and extremity edema.    Expected Outcomes Improve functional capacity of life;Short term: Attendance in program 2-3 days a week with increased exercise capacity. Reported lower sodium intake. Reported increased fruit and vegetable intake. Reports medication compliance.;Short term: Daily weights obtained and reported for  increase. Utilizing diuretic protocols set by physician.;Long term: Adoption of self-care skills and reduction of barriers for early signs and symptoms recognition and intervention leading to self-care maintenance.    Personal Goal Other Yes    Personal Goal improve strength and stamina    Intervention Attend  CR three times per week and begin a home exercise program of 2-4 days per week    Expected Outcomes Pt will report gains in strength and stamina, and he will improve on his walk test distance.             Core Components/Risk Factors/Patient Goals Review:   Goals and Risk Factor Review     Row Name 12/02/21 0906             Core Components/Risk Factors/Patient Goals Review   Personal Goals Review Diabetes;Heart Failure;Other       Review Patient was referred to CR with STEMI/DES. He has multiple risk factors for CAD and is participating in the program for risk modification. He started the program today 5/15. He last A1C was 7.8%. His DM is controlled with insulin and jardiance. His personal goals for the program are to decrease his SOB with activity and improve his strength and staminal. We will continue to monitor his progress as he works towards meeting these goals.       Expected Outcomes Patient will complete the program meeting both personal and program goals.                Core Components/Risk Factors/Patient Goals at Discharge (Final Review):   Goals and Risk Factor Review - 12/02/21 0906       Core Components/Risk Factors/Patient Goals Review   Personal Goals Review Diabetes;Heart Failure;Other    Review Patient was referred to CR with STEMI/DES. He has multiple risk factors for CAD and is participating in the program for risk modification. He started the program today 5/15. He last A1C was 7.8%. His DM is controlled with insulin and jardiance. His personal goals for the program are to decrease his SOB with activity and improve his strength and staminal. We will  continue to monitor his progress as he works towards meeting these goals.    Expected Outcomes Patient will complete the program meeting both personal and program goals.             ITP Comments:   Comments: ITP REVIEW Pt is making expected progress toward Cardiac Rehab goals after completing 5 sessions. Recommend continued exercise, life style modification, education, and increased stamina and strength.

## 2021-12-11 NOTE — Progress Notes (Signed)
Daily Session Note  Patient Details  Name: West N Daloia MRN: [id number] Date of Birth: 01/06/1951 Referring Provider:   Flowsheet Row CARDIAC REHAB PHASE II ORIENTATION from 11/26/2021 in [facility]  Referring Provider Dr. McLean       Encounter Date: 12/11/2021  Check In:  Session Check In - 12/11/21 0815       Check-In   Supervising physician immediately available to respond to emergencies [facility] MD immediately available    Physician(s) Dr. McDowell    Location [facility]    Staff Present Alekzander Cardell, RN;Heather Jachimiak, BS, Exercise Physiologist;Dalton Fletcher, MS, ACSM-CEP, Exercise Physiologist    Virtual Visit No    Medication changes reported     No    Fall or balance concerns reported    No    Tobacco Cessation No Change    Warm-up and Cool-down Performed as group-led instruction    Resistance Training Performed Yes    VAD Patient? No    PAD/SET Patient? No      Pain Assessment   Currently in Pain? No/denies    Multiple Pain Sites No             Capillary Blood Glucose: No results found for this or any previous visit (from the past 24 hour(s)).    Social History   Tobacco Use  Smoking Status Former   Packs/day: 0.50   Years: 20.00   Pack years: 10.00   Types: Cigarettes   Quit date: 08/04/2020   Years since quitting: 1.3  Smokeless Tobacco Never  Tobacco Comments   Quit upon admission to hospital.    Goals Met:  Independence with exercise equipment Exercise tolerated well No report of concerns or symptoms today Strength training completed today  Goals Unmet:  Not Applicable  Comments: checkout @ 9:15am   Dr. Jonathan Branch is Medical Director for [facility] 

## 2021-12-11 NOTE — Progress Notes (Signed)
Daily Session Note  Patient Details  Name: Jose Clark MRN: [id number] Date of Birth: 10/12/1950 Referring Provider:   Flowsheet Row CARDIAC REHAB PHASE II ORIENTATION from 11/26/2021 in [facility]  Referring Provider Dr. McLean       Encounter Date: 12/11/2021  Check In:  Session Check In - 12/11/21 0815       Check-In   Supervising physician immediately available to respond to emergencies [facility] MD immediately available    Physician(s) Dr. McDowell    Location [facility]    Staff Present Phyllis Billingsley, RN;Heather Jachimiak, BS, Exercise Physiologist;Dalton Fletcher, MS, ACSM-CEP, Exercise Physiologist    Virtual Visit No    Medication changes reported     No    Fall or balance concerns reported    No    Tobacco Cessation No Change    Warm-up and Cool-down Performed as group-led instruction    Resistance Training Performed Yes    VAD Patient? No    PAD/SET Patient? No      Pain Assessment   Currently in Pain? No/denies    Multiple Pain Sites No             Capillary Blood Glucose: No results found for this or any previous visit (from the past 24 hour(s)).    Social History   Tobacco Use  Smoking Status Former   Packs/day: 0.50   Years: 20.00   Pack years: 10.00   Types: Cigarettes   Quit date: 08/04/2020   Years since quitting: 1.3  Smokeless Tobacco Never  Tobacco Comments   Quit upon admission to hospital.    Goals Met:  Independence with exercise equipment Exercise tolerated well No report of concerns or symptoms today Strength training completed today  Goals Unmet:  Not Applicable  Comments: checkout @ 9:15am   Dr. Jonathan Branch is Medical Director for [facility] 

## 2021-12-13 ENCOUNTER — Encounter (HOSPITAL_COMMUNITY): Payer: Medicare Other

## 2021-12-13 DIAGNOSIS — R931 Abnormal findings on diagnostic imaging of heart and coronary circulation: Secondary | ICD-10-CM | POA: Diagnosis not present

## 2021-12-13 DIAGNOSIS — I252 Old myocardial infarction: Secondary | ICD-10-CM | POA: Diagnosis not present

## 2021-12-13 DIAGNOSIS — N1831 Chronic kidney disease, stage 3a: Secondary | ICD-10-CM | POA: Diagnosis not present

## 2021-12-13 DIAGNOSIS — I10 Essential (primary) hypertension: Secondary | ICD-10-CM | POA: Diagnosis not present

## 2021-12-13 DIAGNOSIS — Z955 Presence of coronary angioplasty implant and graft: Secondary | ICD-10-CM | POA: Diagnosis not present

## 2021-12-13 DIAGNOSIS — E1165 Type 2 diabetes mellitus with hyperglycemia: Secondary | ICD-10-CM | POA: Diagnosis not present

## 2021-12-13 DIAGNOSIS — R809 Proteinuria, unspecified: Secondary | ICD-10-CM | POA: Diagnosis not present

## 2021-12-13 DIAGNOSIS — E785 Hyperlipidemia, unspecified: Secondary | ICD-10-CM | POA: Diagnosis not present

## 2021-12-13 DIAGNOSIS — R7401 Elevation of levels of liver transaminase levels: Secondary | ICD-10-CM | POA: Diagnosis not present

## 2021-12-13 DIAGNOSIS — E870 Hyperosmolality and hypernatremia: Secondary | ICD-10-CM | POA: Diagnosis not present

## 2021-12-16 ENCOUNTER — Encounter (HOSPITAL_COMMUNITY): Payer: Medicare Other

## 2021-12-17 NOTE — Progress Notes (Shared)
[facility] Note  12/20/2021     CHIEF COMPLAINT Patient presents for No chief complaint on file.    HISTORY OF PRESENT ILLNESS: Jose Clark is a 71 y.o. male who presents to the clinic today for:    Pt has been lost to follow up since November 2022, pt had MI on 01.14.23, pt states he had 1 stent place and his chest pain has since improved   Referring physician: Celene Squibb, MD [address],  [place] [address]  HISTORICAL INFORMATION:   Selected notes from the MEDICAL RECORD NUMBER Referred by Dr. Kathlen Mody for DME   CURRENT MEDICATIONS: No current outpatient medications on file. (Ophthalmic Drugs)   No current facility-administered medications for this visit. (Ophthalmic Drugs)   Current Outpatient Medications (Other)  Medication Sig   aspirin EC 81 MG tablet Take 1 tablet (81 mg total) by mouth daily. Swallow whole.   atorvastatin (LIPITOR) 80 MG tablet Take 1 tablet (80 mg total) by mouth daily.   empagliflozin (JARDIANCE) 25 MG TABS tablet Take 25 mg by mouth daily.   furosemide (LASIX) 20 MG tablet Take 1 tablet (20 mg total) by mouth daily as needed for fluid or edema. (Patient not taking: Reported on 11/25/2021)   insulin glargine (LANTUS) 100 unit/mL SOPN Inject 40 Units into the skin at bedtime.   insulin NPH-insulin regular (NOVOLIN 70/30) (70-30) 100 UNIT/ML injection Inject 10-25 Units into the skin daily as needed (CBG >180).   metoprolol succinate (TOPROL-XL) 25 MG 24 hr tablet Take 25 mg by mouth daily.   metoprolol succinate (TOPROL-XL) 50 MG 24 hr tablet Take 1 tablet (50 mg total) by mouth daily.   nitroGLYCERIN (NITROSTAT) 0.4 MG SL tablet Place 1 tablet (0.4 mg total) under the tongue every 5 (five) minutes as needed.   RYBELSUS 7 MG TABS Take 1 tablet by mouth daily.   sacubitril-valsartan (ENTRESTO) 49-51 MG Take 1 tablet by mouth 2 (two) times daily.   spironolactone (ALDACTONE) 25 MG tablet Take 1 tablet (25  mg total) by mouth daily.   ticagrelor (BRILINTA) 90 MG TABS tablet Take 1 tablet (90 mg total) by mouth 2 (two) times daily.   No current facility-administered medications for this visit. (Other)   REVIEW OF SYSTEMS:   ALLERGIES No Known Allergies  PAST MEDICAL HISTORY Past Medical History:  Diagnosis Date   CAD (coronary artery disease)    Chronic kidney disease, stage 3a (HCC)    Diabetes ([place])    x 20 yrs   Diabetes mellitus    Heart disease    Hepatitis C    HFrEF (heart failure with reduced ejection fraction) (HCC)    Ischemic cardiomyopathy    Mitral regurgitation    Tobacco abuse    Past Surgical History:  Procedure Laterality Date   CIRCUMCISION     30 years ago   COLONOSCOPY N/A 01/24/2016   Procedure: COLONOSCOPY;  Surgeon: Rogene Houston, MD;  [facility];  Service: Endoscopy;  Laterality: N/A;  1130   CORONARY/GRAFT ACUTE MI REVASCULARIZATION N/A 08/03/2021   Procedure: Coronary/Graft Acute MI Revascularization;  Surgeon: Burnell Blanks, MD;  Location: [facility];  Service: Cardiovascular;  Laterality: N/A;   LEFT HEART CATH AND CORONARY ANGIOGRAPHY N/A 08/03/2021   Procedure: LEFT HEART CATH AND CORONARY ANGIOGRAPHY;  Surgeon: Burnell Blanks, MD;  Location: [facility];  Service: Cardiovascular;  Laterality: N/A;   FAMILY  HISTORY Family History  Problem Relation Age of Onset   Dementia Mother    CVA Father    SOCIAL HISTORY Social History   Tobacco Use   Smoking status: Former    Packs/day: 0.50    Years: 20.00    Pack years: 10.00    Types: Cigarettes    Quit date: 08/04/2020    Years since quitting: 1.3   Smokeless tobacco: Never   Tobacco comments:    Quit upon admission to hospital.  Vaping Use   Vaping Use: Never used  Substance Use Topics   Alcohol use: Not Currently    Comment: occassionally  does not drink everyday. Occasional beer.   Drug use: Not Currently    Comment: cocaine        OPHTHALMIC EXAM: Not recorded    IMAGING AND PROCEDURES  Imaging and Procedures for 12/20/2021          ASSESSMENT/PLAN:    ICD-10-CM   1. Proliferative diabetic retinopathy of both eyes with macular edema associated with type 2 diabetes mellitus ([place])  U98.1191     2. Essential hypertension  I10     3. Hypertensive retinopathy of both eyes  H35.033     4. Pseudophakia, both eyes  Z96.1       1. Proliferative diabetic retinopathy, both eyes  - pt lost to f/u since Nov 2022 (5 mos) due to health issues and MI in Jan 2023 - s/p IVA OD #1 (09.14.22), #2 (10.18.22)  - s/p PRP OS 11.04.22 (incomiplete / segmental only) - pt unable to tolerate laser with topical anesthesia -- **will need subconj block for PRP** - exam shows scattered MA, DBH, early NVE OU - FA (09.14.22) shows +focal NVE OU, late leaking MA's OU and vascular perfusion defect OU - OCT shows persistent IRF/DME OD  - recommend holding injection today due to MI in January - pt in agreement - f/u 6-8 weeks -- DFE/OCT/FA (transit OD), possible injection  2,3. Hypertensive retinopathy OU - discussed importance of tight BP control - monitor   4. Pseudophakia OU  - s/p CE/IOL  - IOL in good position, doing well  - monitor   Ophthalmic Meds Ordered this visit:  No orders of the defined types were placed in this encounter.    No follow-ups on file.  There are no Patient Instructions on file for this visit.   Explained the diagnoses, plan, and follow up with the patient and they expressed understanding.  Patient expressed understanding of the importance of proper follow up care.   This document serves as a record of services personally performed by Gardiner Sleeper, MD, PhD. It was created on their behalf by Leonie Douglas, an ophthalmic technician. The creation of this record is the provider's dictation and/or activities during the visit.    Electronically signed by: Leonie Douglas COA, 12/17/21  11:02  AM   Gardiner Sleeper, M.D., Ph.D. Diseases & Surgery of the Retina and Vitreous [facility]: M myopia (nearsighted); A astigmatism; H hyperopia (farsighted); P presbyopia; Mrx spectacle prescription;  CTL contact lenses; OD right eye; OS left eye; OU both eyes  XT exotropia; ET esotropia; PEK punctate epithelial keratitis; PEE punctate epithelial erosions; DES dry eye syndrome; MGD meibomian gland dysfunction; ATs artificial tears; PFAT's preservative free artificial tears; [place] nuclear sclerotic cataract; PSC posterior subcapsular cataract; ERM epi-retinal membrane; PVD posterior vitreous detachment; RD retinal detachment; DM diabetes mellitus; DR diabetic retinopathy; NPDR  non-proliferative diabetic retinopathy; PDR proliferative diabetic retinopathy; CSME clinically significant macular edema; DME diabetic macular edema; dbh dot blot hemorrhages; CWS cotton wool spot; POAG primary open angle glaucoma; C/D cup-to-disc ratio; HVF humphrey visual field; GVF goldmann visual field; OCT optical coherence tomography; IOP intraocular pressure; BRVO Branch retinal vein occlusion; CRVO central retinal vein occlusion; CRAO central retinal artery occlusion; BRAO branch retinal artery occlusion; RT retinal tear; SB scleral buckle; PPV pars plana vitrectomy; VH Vitreous hemorrhage; PRP panretinal laser photocoagulation; IVK intravitreal kenalog; VMT vitreomacular traction; MH Macular hole;  NVD neovascularization of the disc; NVE neovascularization elsewhere; AREDS age related eye disease study; ARMD age related macular degeneration; POAG primary open angle glaucoma; EBMD epithelial/anterior basement membrane dystrophy; ACIOL anterior chamber intraocular lens; IOL intraocular lens; PCIOL posterior chamber intraocular lens; Phaco/IOL phacoemulsification with intraocular lens placement; [place] photorefractive keratectomy; LASIK laser assisted in situ keratomileusis; HTN hypertension;  DM diabetes mellitus; COPD chronic obstructive pulmonary disease

## 2021-12-18 ENCOUNTER — Encounter (HOSPITAL_COMMUNITY)
Admission: RE | Admit: 2021-12-18 | Discharge: 2021-12-18 | Disposition: A | Discharge status: Home / Self Care | Payer: Medicare Other | Source: Ambulatory Visit | Attending: Cardiology | Admitting: Cardiology

## 2021-12-18 DIAGNOSIS — I213 ST elevation (STEMI) myocardial infarction of unspecified site: Secondary | ICD-10-CM

## 2021-12-18 DIAGNOSIS — Z955 Presence of coronary angioplasty implant and graft: Secondary | ICD-10-CM | POA: Diagnosis not present

## 2021-12-18 NOTE — Progress Notes (Signed)
Daily Session Note  Patient Details  Name: KHAI ARRONA MRN: [id number] Date of Birth: January 25, 1951 Referring Provider:   Flowsheet Row CARDIAC REHAB PHASE II ORIENTATION from 11/26/2021 in [facility]  Referring Provider Dr. Aundra Dubin       Encounter Date: 12/18/2021  Check In:  Session Check In - 12/18/21 0811       Check-In   Supervising physician immediately available to respond to emergencies [facility] MD immediately available    Physician(s) Dr Harl Bowie    Location [facility]    Staff Present Geanie Cooley, RN;Aleria Maheu Hassell Done, RN, Bjorn Loser, MS, ACSM-CEP, Exercise Physiologist    Virtual Visit No    Medication changes reported     No    Fall or balance concerns reported    No    Tobacco Cessation No Change    Warm-up and Cool-down Performed as group-led instruction    Resistance Training Performed Yes    VAD Patient? No    PAD/SET Patient? No      Pain Assessment   Currently in Pain? No/denies    Multiple Pain Sites No             Capillary Blood Glucose: No results found for this or any previous visit (from the past 24 hour(s)).    Social History   Tobacco Use  Smoking Status Former   Packs/day: 0.50   Years: 20.00   Pack years: 10.00   Types: Cigarettes   Quit date: 08/04/2020   Years since quitting: 1.3  Smokeless Tobacco Never  Tobacco Comments   Quit upon admission to hospital.    Goals Met:  Independence with exercise equipment Exercise tolerated well No report of concerns or symptoms today Strength training completed today  Goals Unmet:  Not Applicable  Comments: Checkout at 0915.   Dr. Carlyle Dolly is Medical Director for [facility]

## 2021-12-20 ENCOUNTER — Encounter (HOSPITAL_COMMUNITY)
Admission: RE | Admit: 2021-12-20 | Discharge: 2021-12-20 | Disposition: A | Discharge status: Home / Self Care | Payer: Medicare Other | Source: Ambulatory Visit | Attending: Cardiology | Admitting: Cardiology

## 2021-12-20 ENCOUNTER — Encounter (INDEPENDENT_AMBULATORY_CARE_PROVIDER_SITE_OTHER): Payer: Medicare Other | Admitting: Ophthalmology

## 2021-12-20 DIAGNOSIS — I213 ST elevation (STEMI) myocardial infarction of unspecified site: Secondary | ICD-10-CM | POA: Diagnosis not present

## 2021-12-20 DIAGNOSIS — Z955 Presence of coronary angioplasty implant and graft: Secondary | ICD-10-CM | POA: Insufficient documentation

## 2021-12-20 DIAGNOSIS — I5022 Chronic systolic (congestive) heart failure: Secondary | ICD-10-CM | POA: Insufficient documentation

## 2021-12-20 DIAGNOSIS — I10 Essential (primary) hypertension: Secondary | ICD-10-CM

## 2021-12-20 DIAGNOSIS — E113513 Type 2 diabetes mellitus with proliferative diabetic retinopathy with macular edema, bilateral: Secondary | ICD-10-CM

## 2021-12-20 DIAGNOSIS — H35033 Hypertensive retinopathy, bilateral: Secondary | ICD-10-CM

## 2021-12-20 DIAGNOSIS — Z961 Presence of intraocular lens: Secondary | ICD-10-CM

## 2021-12-20 DIAGNOSIS — I255 Ischemic cardiomyopathy: Secondary | ICD-10-CM | POA: Insufficient documentation

## 2021-12-20 NOTE — Progress Notes (Signed)
Daily Session Note  Patient Details  Name: Jose Clark MRN: [id number] Date of Birth: 07-04-1951 Referring Provider:   Flowsheet Row CARDIAC REHAB PHASE II ORIENTATION from 11/26/2021 in [facility]  Referring Provider Dr. Aundra Dubin       Encounter Date: 12/20/2021  Check In:  Session Check In - 12/20/21 0806       Check-In   Supervising physician immediately available to respond to emergencies [facility] MD immediately available    Physician(s) Dr Gardiner Rhyme    Location [facility]    Staff Present Aundra Dubin, RN, Joanette Gula, RN, Bjorn Loser, MS, ACSM-CEP, Exercise Physiologist    Virtual Visit No    Medication changes reported     No    Fall or balance concerns reported    No    Tobacco Cessation No Change    Warm-up and Cool-down Performed as group-led instruction    Resistance Training Performed Yes    VAD Patient? No    PAD/SET Patient? No      Pain Assessment   Currently in Pain? No/denies    Multiple Pain Sites No             Capillary Blood Glucose: No results found for this or any previous visit (from the past 24 hour(s)).    Social History   Tobacco Use  Smoking Status Former   Packs/day: 0.50   Years: 20.00   Pack years: 10.00   Types: Cigarettes   Quit date: 08/04/2020   Years since quitting: 1.3  Smokeless Tobacco Never  Tobacco Comments   Quit upon admission to hospital.    Goals Met:  Independence with exercise equipment Exercise tolerated well No report of concerns or symptoms today Strength training completed today  Goals Unmet:  Not Applicable  Comments: Checkout at 0915.   Dr. Carlyle Dolly is Medical Director for [facility]

## 2021-12-23 ENCOUNTER — Encounter (HOSPITAL_COMMUNITY)
Admission: RE | Admit: 2021-12-23 | Discharge: 2021-12-23 | Disposition: A | Discharge status: Home / Self Care | Payer: Medicare Other | Source: Ambulatory Visit | Attending: Cardiology | Admitting: Cardiology

## 2021-12-23 VITALS — Wt 184.1 lb

## 2021-12-23 DIAGNOSIS — Z955 Presence of coronary angioplasty implant and graft: Secondary | ICD-10-CM | POA: Diagnosis not present

## 2021-12-23 DIAGNOSIS — I213 ST elevation (STEMI) myocardial infarction of unspecified site: Secondary | ICD-10-CM | POA: Diagnosis not present

## 2021-12-23 NOTE — Progress Notes (Signed)
Daily Session Note  Patient Details  Name: ZYMEIR SALMINEN MRN: [id number] Date of Birth: 02/23/1951 Referring Provider:   Flowsheet Row CARDIAC REHAB PHASE II ORIENTATION from 11/26/2021 in [facility]  Referring Provider Dr. Aundra Dubin       Encounter Date: 12/23/2021  Check In:  Session Check In - 12/23/21 0815       Check-In   Supervising physician immediately available to respond to emergencies [facility] MD immediately available    Physician(s) Dr. Marlou Porch    Location [facility]    Staff Present Aundra Dubin, RN, Bjorn Loser, MS, ACSM-CEP, Exercise Physiologist    Virtual Visit No    Medication changes reported     No    Fall or balance concerns reported    No    Tobacco Cessation No Change    Warm-up and Cool-down Performed as group-led instruction    Resistance Training Performed Yes    VAD Patient? No    PAD/SET Patient? No      Pain Assessment   Currently in Pain? No/denies    Multiple Pain Sites No             Capillary Blood Glucose: No results found for this or any previous visit (from the past 24 hour(s)).    Social History   Tobacco Use  Smoking Status Former   Packs/day: 0.50   Years: 20.00   Pack years: 10.00   Types: Cigarettes   Quit date: 08/04/2020   Years since quitting: 1.3  Smokeless Tobacco Never  Tobacco Comments   Quit upon admission to hospital.    Goals Met:  Independence with exercise equipment Exercise tolerated well No report of concerns or symptoms today Strength training completed today  Goals Unmet:  Not Applicable  Comments: Check out 915.   Dr. Carlyle Dolly is Medical Director for [facility]

## 2021-12-23 NOTE — Progress Notes (Signed)
I have reviewed a Home Exercise Prescription with [name] . Jose Clark is not currently exercising at home.  The patient was advised to walk 5 days a week for 30-45 minutes.  [name] and I discussed how to progress their exercise prescription.  The patient stated that their goals were build his strength and endurance.  The patient stated that they understand the exercise prescription.  We reviewed exercise guidelines, target heart rate during exercise, RPE Scale, weather conditions, NTG use, endpoints for exercise, warmup and cool down.  Patient is encouraged to come to me with any questions. I will continue to follow up with the patient to assist them with progression and safety.

## 2021-12-25 ENCOUNTER — Encounter: Payer: Self-pay | Admitting: Internal Medicine

## 2021-12-25 ENCOUNTER — Ambulatory Visit (INDEPENDENT_AMBULATORY_CARE_PROVIDER_SITE_OTHER): Payer: Medicare Other | Admitting: Internal Medicine

## 2021-12-25 ENCOUNTER — Encounter (HOSPITAL_COMMUNITY)
Admission: RE | Admit: 2021-12-25 | Discharge: 2021-12-25 | Disposition: A | Discharge status: Home / Self Care | Payer: Medicare Other | Source: Ambulatory Visit | Attending: Cardiology | Admitting: Cardiology

## 2021-12-25 DIAGNOSIS — I213 ST elevation (STEMI) myocardial infarction of unspecified site: Secondary | ICD-10-CM

## 2021-12-25 DIAGNOSIS — I255 Ischemic cardiomyopathy: Secondary | ICD-10-CM

## 2021-12-25 DIAGNOSIS — Z955 Presence of coronary angioplasty implant and graft: Secondary | ICD-10-CM | POA: Diagnosis not present

## 2021-12-25 DIAGNOSIS — I5022 Chronic systolic (congestive) heart failure: Secondary | ICD-10-CM

## 2021-12-25 LAB — CBC
Hematocrit: 45.5 % (ref 37.5–51.0)
Hemoglobin: 15.8 g/dL (ref 13.0–17.7)
MCH: 31.5 pg (ref 26.6–33.0)
MCHC: 34.7 g/dL (ref 31.5–35.7)
MCV: 91 fL (ref 79–97)
Platelets: 172 10*3/uL (ref 150–450)
RBC: 5.02 x10E6/uL (ref 4.14–5.80)
RDW: 13.7 % (ref 11.6–15.4)
WBC: 6.6 10*3/uL (ref 3.4–10.8)

## 2021-12-25 LAB — BASIC METABOLIC PANEL
BUN/Creatinine Ratio: 13 (ref 10–24)
BUN: 16 mg/dL (ref 8–27)
CO2: 26 mmol/L (ref 20–29)
Calcium: 9.1 mg/dL (ref 8.6–10.2)
Chloride: 104 mmol/L (ref 96–106)
Creatinine, Ser: 1.28 mg/dL — ABNORMAL HIGH (ref 0.76–1.27)
Glucose: 88 mg/dL (ref 70–99)
Potassium: 5.1 mmol/L (ref 3.5–5.2)
Sodium: 136 mmol/L (ref 134–144)
eGFR: 61 mL/min/{1.73_m2} (ref >59)

## 2021-12-25 NOTE — Patient Instructions (Signed)
Medication Instructions:  Your physician recommends that you continue on your current medications as directed. Please refer to the Current Medication list given to you today.  *If you need a refill on your cardiac medications before your next appointment, please call your pharmacy*   Lab Work: CBC and BMET today  If you have labs (blood work) drawn today and your tests are completely normal, you will receive your results only by: [place] (if you have MyChart) OR A paper copy in the mail If you have any lab test that is abnormal or we need to change your treatment, we will call you to review the results.   Testing/Procedures: Your physician has recommended that you have a defibrillator inserted. An implantable cardioverter defibrillator (ICD) is a small device that is placed in your chest or, in rare cases, your abdomen. This device uses electrical pulses or shocks to help control life-threatening, irregular heartbeats that could lead the heart to suddenly stop beating (sudden cardiac arrest). Leads are attached to the ICD that goes into your heart. This is done in the hospital and usually requires an overnight stay. Please see the instruction sheet given to you today for more information.    Follow-Up: At [facility], you and your health needs are our priority.  As part of our continuing mission to provide you with exceptional heart care, we have created designated Provider Care Teams.  These Care Teams include your primary Cardiologist (physician) and Advanced Practice Providers (APPs -  Physician Assistants and Nurse Practitioners) who all work together to provide you with the care you need, when you need it.  We recommend signing up for the patient portal called "MyChart".  Sign up information is provided on this After Visit Summary.  MyChart is used to connect with patients for Virtual Visits (Telemedicine).  Patients are able to view lab/test results, encounter notes, upcoming  appointments, etc.  Non-urgent messages can be sent to your provider as well.   To learn more about what you can do with MyChart, go to [email or web address].    Your next appointment:   To be scheduled  Important Information About Sugar

## 2021-12-25 NOTE — H&P (View-Only) (Signed)
ELECTROPHYSIOLOGY CONSULT NOTE  Patient ID: Jose Clark, MRN: [id number], DOB/AGE: 1950-09-02 71 y.o. Admit date: (Not on file) Date of Consult: 12/25/2021  Primary Physician: Celene Squibb, MD Primary Cardiologist: DM     Jose Clark is a 71 y.o. male who is being seen today for the evaluation of ICD at the request of DM.    HPI Jose Clark is a 71 y.o. male referred for consideration of an ICD  He states he has a history of remote MI  Initially presented To [facility] 1/23 with shortness of breath in the context of NSTEMI.  Catheterization multivessel disease with LAD stenting.  Treated with Entresto spironolactone metoprolol and empagliflozin  Denies chest pain.  Some dyspnea on exertion probably a flight of stairs.  No edema.  Sleeps on 3 pillows but denies nocturnal dyspnea; no orthopnea.  History of syncope-remote associated with standing from bending.  No palpitations.  Cigarettes and alcohol stopped with his MI in January 2023  DATE TEST EF   1/23 Echo   30-35 %   1/23 LHC  LADp-m 99%>>stent RCAp 99%; RCAm-d 100% L>>R collaterals CXo 40%; OM2 60%  4/23 Echo  30-35% MR mod   Date Cr K Hgb  4/23 1.37 5.1 14.7            Past Medical History:  Diagnosis Date   CAD (coronary artery disease)    Chronic kidney disease, stage 3a (HCC)    Diabetes ([place])    x 20 yrs   Diabetes mellitus    Heart disease    Hepatitis C    HFrEF (heart failure with reduced ejection fraction) (HCC)    Ischemic cardiomyopathy    Mitral regurgitation    Tobacco abuse       Surgical History:  Past Surgical History:  Procedure Laterality Date   CIRCUMCISION     30 years ago   COLONOSCOPY N/A 01/24/2016   Procedure: COLONOSCOPY;  Surgeon: Rogene Houston, MD;  [facility];  Service: Endoscopy;  Laterality: N/A;  1130   CORONARY/GRAFT ACUTE MI REVASCULARIZATION N/A 08/03/2021   Procedure: Coronary/Graft Acute MI Revascularization;  Surgeon: Burnell Blanks, MD;  Location: [place] [facility];  Service: Cardiovascular;  Laterality: N/A;   LEFT HEART CATH AND CORONARY ANGIOGRAPHY N/A 08/03/2021   Procedure: LEFT HEART CATH AND CORONARY ANGIOGRAPHY;  Surgeon: Burnell Blanks, MD;  Location: [place] [facility];  Service: Cardiovascular;  Laterality: N/A;     Home Meds: Current Meds  Medication Sig   aspirin EC 81 MG tablet Take 1 tablet (81 mg total) by mouth daily. Swallow whole.   atorvastatin (LIPITOR) 80 MG tablet Take 1 tablet (80 mg total) by mouth daily.   empagliflozin (JARDIANCE) 25 MG TABS tablet Take 25 mg by mouth daily.   furosemide (LASIX) 20 MG tablet Take 1 tablet (20 mg total) by mouth daily as needed for fluid or edema.   insulin glargine (LANTUS) 100 unit/mL SOPN Inject 40 Units into the skin at bedtime.   insulin NPH-insulin regular (NOVOLIN 70/30) (70-30) 100 UNIT/ML injection Inject 10-25 Units into the skin daily as needed (CBG >180).   metoprolol succinate (TOPROL-XL) 25 MG 24 hr tablet Take 25 mg by mouth daily.   metoprolol succinate (TOPROL-XL) 50 MG 24 hr tablet Take 1 tablet (50 mg total) by mouth daily.   nitroGLYCERIN (NITROSTAT) 0.4 MG SL tablet Place 1 tablet (0.4 mg total) under the tongue  every 5 (five) minutes as needed.   RYBELSUS 7 MG TABS Take 1 tablet by mouth daily.   sacubitril-valsartan (ENTRESTO) 49-51 MG Take 1 tablet by mouth 2 (two) times daily.   spironolactone (ALDACTONE) 25 MG tablet Take 1 tablet (25 mg total) by mouth daily.   ticagrelor (BRILINTA) 90 MG TABS tablet Take 1 tablet (90 mg total) by mouth 2 (two) times daily.    Allergies: No Known Allergies  Social History   Socioeconomic History   Marital status: Single    Spouse name: Not on file   Number of children: 1   Years of education: Not on file   Highest education level: High school graduate  Occupational History   Occupation: retired  Tobacco Use   Smoking status: Former    Packs/day: 0.50    Years: 20.00    Pack  years: 10.00    Types: Cigarettes    Quit date: 08/04/2020    Years since quitting: 1.3   Smokeless tobacco: Never   Tobacco comments:    Quit upon admission to hospital.  Vaping Use   Vaping Use: Never used  Substance and Sexual Activity   Alcohol use: Not Currently    Comment: occassionally  does not drink everyday. Occasional beer.   Drug use: Not Currently    Comment: cocaine   Sexual activity: Yes  Other Topics Concern   Not on file  Social History Narrative   Not on file   Social Determinants of Health   Financial Resource Strain: Low Risk    Difficulty of Paying Living Expenses: Not very hard  Food Insecurity: No Food Insecurity   Worried About Running Out of Food in the Last Year: Never true   Ran Out of Food in the Last Year: Never true  Transportation Needs: No Transportation Needs   Lack of Transportation (Medical): No   Lack of Transportation (Non-Medical): No  Physical Activity: Not on file  Stress: Not on file  Social Connections: Not on file  Intimate Partner Violence: Not on file     Family History  Problem Relation Age of Onset   Dementia Mother    CVA Father      ROS:  Please see the history of present illness.     All other systems reviewed and negative.    Physical Exam Blood pressure 118/66, pulse 71, height '5\' 11"'$  (1.803 m), weight 184 lb (83.5 kg), SpO2 97 %. General: Well developed, well nourished male in no acute distress. Head: Normocephalic, atraumatic, sclera non-icteric, no xanthomas, nares are without discharge. EENT: normal  Lymph Nodes:  none Neck: Negative for carotid bruits. JVD not elevated. Back:without scoliosis kyphosis  Lungs: Clear bilaterally to auscultation without wheezes, rales, or rhonchi. Breathing is unlabored. Heart: RRR with S1 S2. No murmur . No rubs, or gallops appreciated. Abdomen: Soft, non-tender, non-distended with normoactive bowel sounds. No hepatomegaly. No rebound/guarding. No obvious abdominal  masses. Msk:  Strength and tone appear normal for age. Extremities: No clubbing or cyanosis. No edema.  Distal pedal pulses are 2+ and equal bilaterally. Skin: Warm and Dry Neuro: Alert and oriented X 3. CN III-XII intact Grossly normal sensory and motor function . Psych:  Responds to questions appropriately with a normal affect.        EKG: Sinus at 71 17/08/39   Assessment and Plan:  Ischemic cardiomyopathy  Congestive heart failure-class IIb-IIIa  Patient has persistent left ventricular dysfunction despite guideline directed medical therapy and revascularization.  With his ischemic  heart disease, it is appropriate that he be considered for an ICD for primary prevention with his modest congestive heart failure.   We have discussed the relative benefits and merits of transvenous versus subcutaneous ICD implantation.  The advantages of the former including battery longevity, history derived from use in randomized controlled trials and perhaps a somewhat lower rate of inappropriate ICD discharges. Advantages of the latter  include the fact that it is extravascular,  Resulting different implications of device infection and it being non-transvalvular.    He would like to proceed with a subcutaneous device.   Virl Axe

## 2021-12-25 NOTE — Progress Notes (Signed)
Daily Session Note  Patient Details  Name: Jose Clark MRN: [id number] Date of Birth: 11/06/1950 Referring Provider:   Flowsheet Row CARDIAC REHAB PHASE II ORIENTATION from 11/26/2021 in [place]  Referring Provider Dr. Aundra Dubin       Encounter Date: 12/25/2021  Check In:  Session Check In - 12/25/21 0808       Check-In   Supervising physician immediately available to respond to emergencies [facility] MD immediately available    Physician(s) Dr Johnsie Cancel    Location [facility]    Staff Present Hoy Register, MS, ACSM-CEP, Exercise Physiologist;Angla Delahunt Hassell Done, RN, BSN;Heather Otho Ket, BS, Exercise Physiologist    Virtual Visit No    Medication changes reported     No    Fall or balance concerns reported    No    Tobacco Cessation No Change    Warm-up and Cool-down Performed as group-led instruction    Resistance Training Performed Yes    VAD Patient? No    PAD/SET Patient? No      Pain Assessment   Currently in Pain? No/denies    Multiple Pain Sites No             Capillary Blood Glucose: No results found for this or any previous visit (from the past 24 hour(s)).    Social History   Tobacco Use  Smoking Status Former   Packs/day: 0.50   Years: 20.00   Pack years: 10.00   Types: Cigarettes   Quit date: 08/04/2020   Years since quitting: 1.3  Smokeless Tobacco Never  Tobacco Comments   Quit upon admission to hospital.    Goals Met:  Independence with exercise equipment Exercise tolerated well No report of concerns or symptoms today Strength training completed today  Goals Unmet:  Not Applicable  Comments: Checkout at 0915.   Dr. Carlyle Dolly is Medical Director for [facility]

## 2021-12-25 NOTE — Progress Notes (Signed)
ELECTROPHYSIOLOGY CONSULT NOTE  Patient ID: Jose Clark, MRN: [id number], DOB/AGE: 1951-04-29 71 y.o. Admit date: (Not on file) Date of Consult: 12/25/2021  Primary Physician: Celene Squibb, MD Primary Cardiologist: DM     Jose Clark is a 71 y.o. male who is being seen today for the evaluation of ICD at the request of DM.    HPI Jose Clark is a 71 y.o. male referred for consideration of an ICD  He states he has a history of remote MI  Initially presented To [facility] 1/23 with shortness of breath in the context of NSTEMI.  Catheterization multivessel disease with LAD stenting.  Treated with Entresto spironolactone metoprolol and empagliflozin  Denies chest pain.  Some dyspnea on exertion probably a flight of stairs.  No edema.  Sleeps on 3 pillows but denies nocturnal dyspnea; no orthopnea.  History of syncope-remote associated with standing from bending.  No palpitations.  Cigarettes and alcohol stopped with his MI in January 2023  DATE TEST EF   1/23 Echo   30-35 %   1/23 LHC  LADp-m 99%>>stent RCAp 99%; RCAm-d 100% L>>R collaterals CXo 40%; OM2 60%  4/23 Echo  30-35% MR mod   Date Cr K Hgb  4/23 1.37 5.1 14.7            Past Medical History:  Diagnosis Date   CAD (coronary artery disease)    Chronic kidney disease, stage 3a (HCC)    Diabetes ([place])    x 20 yrs   Diabetes mellitus    Heart disease    Hepatitis C    HFrEF (heart failure with reduced ejection fraction) (HCC)    Ischemic cardiomyopathy    Mitral regurgitation    Tobacco abuse       Surgical History:  Past Surgical History:  Procedure Laterality Date   CIRCUMCISION     30 years ago   COLONOSCOPY N/A 01/24/2016   Procedure: COLONOSCOPY;  Surgeon: Rogene Houston, MD;  [facility];  Service: Endoscopy;  Laterality: N/A;  1130   CORONARY/GRAFT ACUTE MI REVASCULARIZATION N/A 08/03/2021   Procedure: Coronary/Graft Acute MI Revascularization;  Surgeon: Burnell Blanks, MD;  Location: [place] [facility];  Service: Cardiovascular;  Laterality: N/A;   LEFT HEART CATH AND CORONARY ANGIOGRAPHY N/A 08/03/2021   Procedure: LEFT HEART CATH AND CORONARY ANGIOGRAPHY;  Surgeon: Burnell Blanks, MD;  Location: [place] [facility];  Service: Cardiovascular;  Laterality: N/A;     Home Meds: Current Meds  Medication Sig   aspirin EC 81 MG tablet Take 1 tablet (81 mg total) by mouth daily. Swallow whole.   atorvastatin (LIPITOR) 80 MG tablet Take 1 tablet (80 mg total) by mouth daily.   empagliflozin (JARDIANCE) 25 MG TABS tablet Take 25 mg by mouth daily.   furosemide (LASIX) 20 MG tablet Take 1 tablet (20 mg total) by mouth daily as needed for fluid or edema.   insulin glargine (LANTUS) 100 unit/mL SOPN Inject 40 Units into the skin at bedtime.   insulin NPH-insulin regular (NOVOLIN 70/30) (70-30) 100 UNIT/ML injection Inject 10-25 Units into the skin daily as needed (CBG >180).   metoprolol succinate (TOPROL-XL) 25 MG 24 hr tablet Take 25 mg by mouth daily.   metoprolol succinate (TOPROL-XL) 50 MG 24 hr tablet Take 1 tablet (50 mg total) by mouth daily.   nitroGLYCERIN (NITROSTAT) 0.4 MG SL tablet Place 1 tablet (0.4 mg total) under the tongue  every 5 (five) minutes as needed.   RYBELSUS 7 MG TABS Take 1 tablet by mouth daily.   sacubitril-valsartan (ENTRESTO) 49-51 MG Take 1 tablet by mouth 2 (two) times daily.   spironolactone (ALDACTONE) 25 MG tablet Take 1 tablet (25 mg total) by mouth daily.   ticagrelor (BRILINTA) 90 MG TABS tablet Take 1 tablet (90 mg total) by mouth 2 (two) times daily.    Allergies: No Known Allergies  Social History   Socioeconomic History   Marital status: Single    Spouse name: Not on file   Number of children: 1   Years of education: Not on file   Highest education level: High school graduate  Occupational History   Occupation: retired  Tobacco Use   Smoking status: Former    Packs/day: 0.50    Years: 20.00    Pack  years: 10.00    Types: Cigarettes    Quit date: 08/04/2020    Years since quitting: 1.3   Smokeless tobacco: Never   Tobacco comments:    Quit upon admission to hospital.  Vaping Use   Vaping Use: Never used  Substance and Sexual Activity   Alcohol use: Not Currently    Comment: occassionally  does not drink everyday. Occasional beer.   Drug use: Not Currently    Comment: cocaine   Sexual activity: Yes  Other Topics Concern   Not on file  Social History Narrative   Not on file   Social Determinants of Health   Financial Resource Strain: Low Risk    Difficulty of Paying Living Expenses: Not very hard  Food Insecurity: No Food Insecurity   Worried About Running Out of Food in the Last Year: Never true   Ran Out of Food in the Last Year: Never true  Transportation Needs: No Transportation Needs   Lack of Transportation (Medical): No   Lack of Transportation (Non-Medical): No  Physical Activity: Not on file  Stress: Not on file  Social Connections: Not on file  Intimate Partner Violence: Not on file     Family History  Problem Relation Age of Onset   Dementia Mother    CVA Father      ROS:  Please see the history of present illness.     All other systems reviewed and negative.    Physical Exam Blood pressure 118/66, pulse 71, height '5\' 11"'$  (1.803 m), weight 184 lb (83.5 kg), SpO2 97 %. General: Well developed, well nourished male in no acute distress. Head: Normocephalic, atraumatic, sclera non-icteric, no xanthomas, nares are without discharge. EENT: normal  Lymph Nodes:  none Neck: Negative for carotid bruits. JVD not elevated. Back:without scoliosis kyphosis  Lungs: Clear bilaterally to auscultation without wheezes, rales, or rhonchi. Breathing is unlabored. Heart: RRR with S1 S2. No murmur . No rubs, or gallops appreciated. Abdomen: Soft, non-tender, non-distended with normoactive bowel sounds. No hepatomegaly. No rebound/guarding. No obvious abdominal  masses. Msk:  Strength and tone appear normal for age. Extremities: No clubbing or cyanosis. No edema.  Distal pedal pulses are 2+ and equal bilaterally. Skin: Warm and Dry Neuro: Alert and oriented X 3. CN III-XII intact Grossly normal sensory and motor function . Psych:  Responds to questions appropriately with a normal affect.        EKG: Sinus at 71 17/08/39   Assessment and Plan:  Ischemic cardiomyopathy  Congestive heart failure-class IIb-IIIa  Patient has persistent left ventricular dysfunction despite guideline directed medical therapy and revascularization.  With his ischemic  heart disease, it is appropriate that he be considered for an ICD for primary prevention with his modest congestive heart failure.   We have discussed the relative benefits and merits of transvenous versus subcutaneous ICD implantation.  The advantages of the former including battery longevity, history derived from use in randomized controlled trials and perhaps a somewhat lower rate of inappropriate ICD discharges. Advantages of the latter  include the fact that it is extravascular,  Resulting different implications of device infection and it being non-transvalvular.    He would like to proceed with a subcutaneous device.   Virl Axe

## 2021-12-27 ENCOUNTER — Encounter (HOSPITAL_COMMUNITY)
Admission: RE | Admit: 2021-12-27 | Discharge: 2021-12-27 | Disposition: A | Discharge status: Home / Self Care | Payer: Medicare Other | Source: Ambulatory Visit | Attending: Cardiology | Admitting: Cardiology

## 2021-12-27 DIAGNOSIS — I213 ST elevation (STEMI) myocardial infarction of unspecified site: Secondary | ICD-10-CM | POA: Diagnosis not present

## 2021-12-27 DIAGNOSIS — Z955 Presence of coronary angioplasty implant and graft: Secondary | ICD-10-CM | POA: Diagnosis not present

## 2021-12-27 NOTE — Progress Notes (Signed)
Daily Session Note  Patient Details  Name: Jose Clark MRN: [id number] Date of Birth: 1951/04/22 Referring Provider:   Flowsheet Row CARDIAC REHAB PHASE II ORIENTATION from 11/26/2021 in [place]  Referring Provider Dr. Aundra Dubin       Encounter Date: 12/27/2021  Check In:  Session Check In - 12/27/21 0804       Check-In   Supervising physician immediately available to respond to emergencies [facility] MD immediately available    Physician(s) Dr Domenic Polite    Location [facility]    Staff Present Roslynn Holte Hassell Done, RN, Bjorn Loser, MS, ACSM-CEP, Exercise Physiologist;Heather Zigmund Daniel, Exercise Physiologist    Virtual Visit No    Medication changes reported     No    Fall or balance concerns reported    No    Tobacco Cessation No Change    Warm-up and Cool-down Performed as group-led instruction    Resistance Training Performed Yes    VAD Patient? No    PAD/SET Patient? No      Pain Assessment   Currently in Pain? No/denies    Multiple Pain Sites No             Capillary Blood Glucose: No results found for this or any previous visit (from the past 24 hour(s)).    Social History   Tobacco Use  Smoking Status Former   Packs/day: 0.50   Years: 20.00   Total pack years: 10.00   Types: Cigarettes   Quit date: 08/04/2020   Years since quitting: 1.3  Smokeless Tobacco Never  Tobacco Comments   Quit upon admission to hospital.    Goals Met:  Independence with exercise equipment Exercise tolerated well No report of concerns or symptoms today Strength training completed today  Goals Unmet:  Not Applicable  Comments: Checkout at [place]   Dr. Carlyle Dolly is Medical Director for [facility]

## 2021-12-29 DIAGNOSIS — E1165 Type 2 diabetes mellitus with hyperglycemia: Secondary | ICD-10-CM | POA: Diagnosis not present

## 2021-12-30 ENCOUNTER — Encounter (HOSPITAL_COMMUNITY)
Admission: RE | Admit: 2021-12-30 | Discharge: 2021-12-30 | Disposition: A | Discharge status: Home / Self Care | Payer: Medicare Other | Source: Ambulatory Visit | Attending: Cardiology | Admitting: Cardiology

## 2021-12-30 DIAGNOSIS — I213 ST elevation (STEMI) myocardial infarction of unspecified site: Secondary | ICD-10-CM | POA: Diagnosis not present

## 2021-12-30 DIAGNOSIS — Z955 Presence of coronary angioplasty implant and graft: Secondary | ICD-10-CM

## 2021-12-30 NOTE — Progress Notes (Signed)
Daily Session Note  Patient Details  Name: Jose Clark MRN: [id number] Date of Birth: 1950-08-03 Referring Provider:   Flowsheet Row CARDIAC REHAB PHASE II ORIENTATION from 11/26/2021 in [place]  Referring Provider Dr. Aundra Dubin       Encounter Date: 12/30/2021  Check In:  Session Check In - 12/30/21 0814       Check-In   Supervising physician immediately available to respond to emergencies [facility] MD immediately available    Physician(s) Dr. Debara Pickett    Location [facility]    Staff Present Redge Gainer, BS, Exercise Physiologist;Dalton Kris Mouton, MS, ACSM-CEP, Exercise Physiologist    Virtual Visit No    Medication changes reported     No    Fall or balance concerns reported    No    Tobacco Cessation No Change    Warm-up and Cool-down Performed as group-led instruction    Resistance Training Performed Yes    VAD Patient? No    PAD/SET Patient? No      Pain Assessment   Currently in Pain? No/denies    Multiple Pain Sites No             Capillary Blood Glucose: No results found for this or any previous visit (from the past 24 hour(s)).    Social History   Tobacco Use  Smoking Status Former   Packs/day: 0.50   Years: 20.00   Total pack years: 10.00   Types: Cigarettes   Quit date: 08/04/2020   Years since quitting: 1.4  Smokeless Tobacco Never  Tobacco Comments   Quit upon admission to hospital.    Goals Met:  Independence with exercise equipment Exercise tolerated well No report of concerns or symptoms today  Goals Unmet:  Not Applicable  Comments: check out 0915   Dr. Carlyle Dolly is Medical Director for [facility]

## 2022-01-01 ENCOUNTER — Encounter (HOSPITAL_COMMUNITY)
Admission: RE | Admit: 2022-01-01 | Discharge: 2022-01-01 | Disposition: A | Discharge status: Home / Self Care | Payer: Medicare Other | Source: Ambulatory Visit | Attending: Cardiology | Admitting: Cardiology

## 2022-01-01 DIAGNOSIS — Z955 Presence of coronary angioplasty implant and graft: Secondary | ICD-10-CM

## 2022-01-01 DIAGNOSIS — I213 ST elevation (STEMI) myocardial infarction of unspecified site: Secondary | ICD-10-CM | POA: Diagnosis not present

## 2022-01-01 NOTE — Progress Notes (Signed)
Daily Session Note  Patient Details  Name: Jose Clark MRN: [id number] Date of Birth: 04/20/1951 Referring Provider:   Flowsheet Row CARDIAC REHAB PHASE II ORIENTATION from 11/26/2021 in [place]  Referring Provider Dr. Aundra Dubin       Encounter Date: 01/01/2022  Check In:  Session Check In - 01/01/22 0815       Check-In   Supervising physician immediately available to respond to emergencies [facility] MD immediately available    Physician(s) Dr. Radford Pax    Location [facility]    Staff Present Redge Gainer, BS, Exercise Physiologist;Dalton Kris Mouton, MS, ACSM-CEP, Exercise Physiologist;Daphyne Hassell Done, RN, BSN    Virtual Visit No    Medication changes reported     No    Fall or balance concerns reported    No    Tobacco Cessation No Change    Warm-up and Cool-down Performed as group-led instruction    Resistance Training Performed Yes    VAD Patient? No    PAD/SET Patient? No      Pain Assessment   Currently in Pain? No/denies    Multiple Pain Sites No             Capillary Blood Glucose: No results found for this or any previous visit (from the past 24 hour(s)).    Social History   Tobacco Use  Smoking Status Former   Packs/day: 0.50   Years: 20.00   Total pack years: 10.00   Types: Cigarettes   Quit date: 08/04/2020   Years since quitting: 1.4  Smokeless Tobacco Never  Tobacco Comments   Quit upon admission to hospital.    Goals Met:  Independence with exercise equipment Exercise tolerated well No report of concerns or symptoms today Strength training completed today  Goals Unmet:  Not Applicable  Comments: Check out 0915   Dr. Carlyle Dolly is Medical Director for [facility]

## 2022-01-03 ENCOUNTER — Encounter (HOSPITAL_COMMUNITY): Payer: Medicare Other

## 2022-01-06 ENCOUNTER — Encounter (HOSPITAL_COMMUNITY)
Admission: RE | Admit: 2022-01-06 | Discharge: 2022-01-06 | Disposition: A | Discharge status: Home / Self Care | Payer: Medicare Other | Source: Ambulatory Visit | Attending: Cardiology | Admitting: Cardiology

## 2022-01-06 VITALS — Wt 183.2 lb

## 2022-01-06 DIAGNOSIS — Z955 Presence of coronary angioplasty implant and graft: Secondary | ICD-10-CM | POA: Diagnosis not present

## 2022-01-06 DIAGNOSIS — I213 ST elevation (STEMI) myocardial infarction of unspecified site: Secondary | ICD-10-CM

## 2022-01-06 NOTE — Progress Notes (Signed)
Daily Session Note  Patient Details  Name: Jose Clark MRN: [id number] Date of Birth: 03-04-1951 Referring Provider:   Flowsheet Row CARDIAC REHAB PHASE II ORIENTATION from 11/26/2021 in [place]  Referring Provider Dr. Aundra Dubin       Encounter Date: 01/06/2022  Check In:  Session Check In - 01/06/22 0815       Check-In   Supervising physician immediately available to respond to emergencies [facility] MD immediately available    Physician(s) Dr. Johney Frame    Location [facility]    Staff Present Redge Gainer, BS, Exercise Physiologist;Dalton Kris Mouton, MS, ACSM-CEP, Exercise Physiologist;Daphyne Hassell Done, RN, BSN    Virtual Visit No    Medication changes reported     No    Fall or balance concerns reported    No    Tobacco Cessation No Change    Warm-up and Cool-down Performed as group-led instruction    Resistance Training Performed Yes    VAD Patient? No    PAD/SET Patient? No      Pain Assessment   Currently in Pain? No/denies    Multiple Pain Sites No             Capillary Blood Glucose: No results found for this or any previous visit (from the past 24 hour(s)).    Social History   Tobacco Use  Smoking Status Former   Packs/day: 0.50   Years: 20.00   Total pack years: 10.00   Types: Cigarettes   Quit date: 08/04/2020   Years since quitting: 1.4  Smokeless Tobacco Never  Tobacco Comments   Quit upon admission to hospital.    Goals Met:  Independence with exercise equipment Exercise tolerated well No report of concerns or symptoms today Strength training completed today  Goals Unmet:  Not Applicable  Comments: check out 0915   Dr. Carlyle Dolly is Medical Director for [facility]

## 2022-01-08 ENCOUNTER — Encounter (HOSPITAL_COMMUNITY): Payer: Medicare Other

## 2022-01-08 NOTE — Progress Notes (Signed)
Cardiac Individual Treatment Plan  Patient Details  Name: Jose Clark MRN: [id number] Date of Birth: 1951/02/05 Referring Provider:   Flowsheet Row CARDIAC REHAB PHASE II ORIENTATION from 11/26/2021 in [place]  Referring Provider Dr. Aundra Dubin       Initial Encounter Date:  Flowsheet Row CARDIAC REHAB PHASE II ORIENTATION from 11/26/2021 in [place]  Date 11/26/21       Visit Diagnosis: Status post coronary artery stent placement  ST elevation myocardial infarction (STEMI), unspecified artery ([place])  Patient's Home Medications on Admission:  Current Outpatient Medications:    aspirin EC 81 MG tablet, Take 1 tablet (81 mg total) by mouth daily. Swallow whole., Disp: 90 tablet, Rfl: 3   atorvastatin (LIPITOR) 80 MG tablet, Take 1 tablet (80 mg total) by mouth daily., Disp: 90 tablet, Rfl: 3   empagliflozin (JARDIANCE) 25 MG TABS tablet, Take 25 mg by mouth daily., Disp: , Rfl:    furosemide (LASIX) 20 MG tablet, Take 1 tablet (20 mg total) by mouth daily as needed for fluid or edema., Disp: 30 tablet, Rfl: 2   insulin glargine (LANTUS) 100 unit/mL SOPN, Inject 40 Units into the skin at bedtime., Disp: , Rfl:    insulin NPH-insulin regular (NOVOLIN 70/30) (70-30) 100 UNIT/ML injection, Inject 10-25 Units into the skin daily as needed (CBG >180)., Disp: , Rfl:    metoprolol succinate (TOPROL-XL) 25 MG 24 hr tablet, Take 25 mg by mouth daily., Disp: , Rfl:    metoprolol succinate (TOPROL-XL) 50 MG 24 hr tablet, Take 1 tablet (50 mg total) by mouth daily., Disp: 90 tablet, Rfl: 3   nitroGLYCERIN (NITROSTAT) 0.4 MG SL tablet, Place 1 tablet (0.4 mg total) under the tongue every 5 (five) minutes as needed., Disp: 25 tablet, Rfl: 12   RYBELSUS 7 MG TABS, Take 1 tablet by mouth daily., Disp: , Rfl:    sacubitril-valsartan (ENTRESTO) 49-51 MG, Take 1 tablet by mouth 2 (two) times daily., Disp: 180 tablet, Rfl: 3   spironolactone (ALDACTONE) 25 MG  tablet, Take 1 tablet (25 mg total) by mouth daily., Disp: 90 tablet, Rfl: 3   ticagrelor (BRILINTA) 90 MG TABS tablet, Take 1 tablet (90 mg total) by mouth 2 (two) times daily., Disp: 180 tablet, Rfl: 3  Past Medical History: Past Medical History:  Diagnosis Date   CAD (coronary artery disease)    Chronic kidney disease, stage 3a (HCC)    Diabetes ([place])    x 20 yrs   Diabetes mellitus    Heart disease    Hepatitis C    HFrEF (heart failure with reduced ejection fraction) (HCC)    Ischemic cardiomyopathy    Mitral regurgitation    Tobacco abuse     Tobacco Use: Social History   Tobacco Use  Smoking Status Former   Packs/day: 0.50   Years: 20.00   Total pack years: 10.00   Types: Cigarettes   Quit date: 08/04/2020   Years since quitting: 1.4  Smokeless Tobacco Never  Tobacco Comments   Quit upon admission to hospital.    Labs: Review Flowsheet       Latest Ref Rng & Units 08/05/2021 08/06/2021 09/17/2021  Labs for ITP Cardiac and Pulmonary Rehab  Cholestrol 0 - 200 mg/dL - 180  147   LDL (calc) 0 - 99 mg/dL - 110  75   Direct LDL 0 - 99 mg/dL - - 65.4   HDL-C >40 mg/dL - 51  56   Trlycerides <  150 mg/dL - 96  79   Hemoglobin A1c 4.8 - 5.6 % 7.8  - -    Capillary Blood Glucose: Lab Results  Component Value Date   GLUCAP 70 08/06/2021   GLUCAP 155 (H) 08/05/2021   GLUCAP 141 (H) 08/05/2021   GLUCAP 110 (H) 08/05/2021   GLUCAP 84 08/05/2021    POCT Glucose     Row Name 11/26/21 0917             POCT Blood Glucose   Pre-Exercise 138 mg/dL                Exercise Target Goals: Exercise Program Goal: Individual exercise prescription set using results from initial 6 min walk test and THRR while considering  patient's activity barriers and safety.   Exercise Prescription Goal: Starting with aerobic activity 30 plus minutes a day, 3 days per week for initial exercise prescription. Provide home exercise prescription and guidelines that participant  acknowledges understanding prior to discharge.  Activity Barriers & Risk Stratification:  Activity Barriers & Cardiac Risk Stratification - 11/26/21 0850       Activity Barriers & Cardiac Risk Stratification   Activity Barriers Decreased Ventricular Function;Other (comment)    Comments bunion on right foot    Cardiac Risk Stratification High             6 Minute Walk:  6 Minute Walk     Row Name 11/26/21 1013         6 Minute Walk   Phase Initial     Distance 1500 feet     Walk Time 6 minutes     # of Rest Breaks 0     MPH 2.84     METS 3.32     RPE 11     VO2 Peak 11.63     Symptoms No     Resting HR 81 bpm     Resting BP 100/68     Resting Oxygen Saturation  100 %     Exercise Oxygen Saturation  during 6 min walk 98 %     Max Ex. HR 93 bpm     Max Ex. BP 116/70     2 Minute Post BP 100/66              Oxygen Initial Assessment:   Oxygen Re-Evaluation:   Oxygen Discharge (Final Oxygen Re-Evaluation):   Initial Exercise Prescription:  Initial Exercise Prescription - 11/26/21 1000       Date of Initial Exercise RX and Referring Provider   Date 11/26/21    Referring Provider Dr. Aundra Dubin    Expected Discharge Date 02/21/22      Treadmill   MPH 1.5    Grade 0    Minutes 17      Recumbant Elliptical   Level 1    RPM 40    Minutes 22      Prescription Details   Frequency (times per week) 3    Duration Progress to 30 minutes of continuous aerobic without signs/symptoms of physical distress      Intensity   THRR 40-80% of Max Heartrate 60-121    Ratings of Perceived Exertion 11-13      Resistance Training   Training Prescription Yes    Weight 3    Reps 10-15             Perform Capillary Blood Glucose checks as needed.  Exercise Prescription Changes:   Exercise Prescription Changes  [place] Name 12/09/21 1000 12/23/21 1200 12/23/21 1500 01/06/22 1000       Response to Exercise   Blood Pressure (Admit) 112/60 -- 128/70  104/64    Blood Pressure (Exercise) 112/64 -- 90/60 110/58    Blood Pressure (Exit) 100/62 -- 90/90 108/58    Heart Rate (Admit) 80 bpm -- 94 bpm 90 bpm    Heart Rate (Exercise) 118 bpm -- 136 bpm 113 bpm    Heart Rate (Exit) 89 bpm -- 103 bpm 96 bpm    Rating of Perceived Exertion (Exercise) 11 -- 12 12    Duration Continue with 30 min of aerobic exercise without signs/symptoms of physical distress. -- Continue with 30 min of aerobic exercise without signs/symptoms of physical distress. Continue with 30 min of aerobic exercise without signs/symptoms of physical distress.    Intensity THRR unchanged -- THRR unchanged THRR unchanged      Progression   Progression Continue to progress workloads to maintain intensity without signs/symptoms of physical distress. -- Continue to progress workloads to maintain intensity without signs/symptoms of physical distress. Continue to progress workloads to maintain intensity without signs/symptoms of physical distress.      Resistance Training   Training Prescription Yes -- Yes Yes    Weight 4 -- 4 4    Reps 10-15 -- 10-15 10-15    Time 10 Minutes -- 10 Minutes 10 Minutes      Treadmill   MPH 1.5 -- 2.4 2.3    Grade 0 -- 1 1.3    Minutes 17 -- 17 17    METs 2.15 -- 3.17 3.08      Recumbant Elliptical   Level 1 -- 4 4    RPM 64 -- 65 62    Minutes 22 -- 22 22    METs 4.3 -- 4.3 4.7      Home Exercise Plan   Plans to continue exercise at -- Home (comment) -- --    Frequency -- Add 2 additional days to program exercise sessions. -- --    Initial Home Exercises Provided -- 12/23/21 -- --             Exercise Comments:   Exercise Comments     Row Name 12/23/21 1253           Exercise Comments home exercise reviewed                Exercise Goals and Review:   Exercise Goals     Row Name 11/26/21 1016 12/09/21 1033 01/06/22 1036         Exercise Goals   Increase Physical Activity Yes Yes Yes     Intervention Provide  advice, education, support and counseling about physical activity/exercise needs.;Develop an individualized exercise prescription for aerobic and resistive training based on initial evaluation findings, risk stratification, comorbidities and participant's personal goals. Provide advice, education, support and counseling about physical activity/exercise needs.;Develop an individualized exercise prescription for aerobic and resistive training based on initial evaluation findings, risk stratification, comorbidities and participant's personal goals. Provide advice, education, support and counseling about physical activity/exercise needs.;Develop an individualized exercise prescription for aerobic and resistive training based on initial evaluation findings, risk stratification, comorbidities and participant's personal goals.     Expected Outcomes Short Term: Attend rehab on a regular basis to increase amount of physical activity.;Long Term: Add in home exercise to make exercise part of routine and to increase amount of physical activity.;Long Term: Exercising regularly at least 3-5 days a  week. Short Term: Attend rehab on a regular basis to increase amount of physical activity.;Long Term: Add in home exercise to make exercise part of routine and to increase amount of physical activity.;Long Term: Exercising regularly at least 3-5 days a week. Short Term: Attend rehab on a regular basis to increase amount of physical activity.;Long Term: Add in home exercise to make exercise part of routine and to increase amount of physical activity.;Long Term: Exercising regularly at least 3-5 days a week.     Increase Strength and Stamina Yes Yes Yes     Intervention Provide advice, education, support and counseling about physical activity/exercise needs.;Develop an individualized exercise prescription for aerobic and resistive training based on initial evaluation findings, risk stratification, comorbidities and participant's  personal goals. Provide advice, education, support and counseling about physical activity/exercise needs.;Develop an individualized exercise prescription for aerobic and resistive training based on initial evaluation findings, risk stratification, comorbidities and participant's personal goals. Provide advice, education, support and counseling about physical activity/exercise needs.;Develop an individualized exercise prescription for aerobic and resistive training based on initial evaluation findings, risk stratification, comorbidities and participant's personal goals.     Expected Outcomes Short Term: Increase workloads from initial exercise prescription for resistance, speed, and METs.;Short Term: Perform resistance training exercises routinely during rehab and add in resistance training at home;Long Term: Improve cardiorespiratory fitness, muscular endurance and strength as measured by increased METs and functional capacity (6MWT) Short Term: Increase workloads from initial exercise prescription for resistance, speed, and METs.;Short Term: Perform resistance training exercises routinely during rehab and add in resistance training at home;Long Term: Improve cardiorespiratory fitness, muscular endurance and strength as measured by increased METs and functional capacity (6MWT) Short Term: Increase workloads from initial exercise prescription for resistance, speed, and METs.;Short Term: Perform resistance training exercises routinely during rehab and add in resistance training at home;Long Term: Improve cardiorespiratory fitness, muscular endurance and strength as measured by increased METs and functional capacity (6MWT)     Able to understand and use rate of perceived exertion (RPE) scale Yes Yes Yes     Intervention Provide education and explanation on how to use RPE scale Provide education and explanation on how to use RPE scale Provide education and explanation on how to use RPE scale     Expected Outcomes  Short Term: Able to use RPE daily in rehab to express subjective intensity level;Long Term:  Able to use RPE to guide intensity level when exercising independently Short Term: Able to use RPE daily in rehab to express subjective intensity level;Long Term:  Able to use RPE to guide intensity level when exercising independently Short Term: Able to use RPE daily in rehab to express subjective intensity level;Long Term:  Able to use RPE to guide intensity level when exercising independently     Knowledge and understanding of Target Heart Rate Range (THRR) Yes Yes Yes     Intervention Provide education and explanation of THRR including how the numbers were predicted and where they are located for reference Provide education and explanation of THRR including how the numbers were predicted and where they are located for reference Provide education and explanation of THRR including how the numbers were predicted and where they are located for reference     Expected Outcomes Short Term: Able to state/look up THRR;Long Term: Able to use THRR to govern intensity when exercising independently;Short Term: Able to use daily as guideline for intensity in rehab Short Term: Able to state/look up THRR;Long Term: Able to use THRR  to govern intensity when exercising independently;Short Term: Able to use daily as guideline for intensity in rehab Short Term: Able to state/look up THRR;Long Term: Able to use THRR to govern intensity when exercising independently;Short Term: Able to use daily as guideline for intensity in rehab     Able to check pulse independently Yes Yes Yes     Intervention Provide education and demonstration on how to check pulse in carotid and radial arteries.;Review the importance of being able to check your own pulse for safety during independent exercise Provide education and demonstration on how to check pulse in carotid and radial arteries.;Review the importance of being able to check your own pulse for  safety during independent exercise Provide education and demonstration on how to check pulse in carotid and radial arteries.;Review the importance of being able to check your own pulse for safety during independent exercise     Expected Outcomes Short Term: Able to explain why pulse checking is important during independent exercise;Long Term: Able to check pulse independently and accurately Short Term: Able to explain why pulse checking is important during independent exercise;Long Term: Able to check pulse independently and accurately Short Term: Able to explain why pulse checking is important during independent exercise;Long Term: Able to check pulse independently and accurately     Understanding of Exercise Prescription Yes Yes Yes     Intervention Provide education, explanation, and written materials on patient's individual exercise prescription Provide education, explanation, and written materials on patient's individual exercise prescription Provide education, explanation, and written materials on patient's individual exercise prescription     Expected Outcomes Short Term: Able to explain program exercise prescription;Long Term: Able to explain home exercise prescription to exercise independently Short Term: Able to explain program exercise prescription;Long Term: Able to explain home exercise prescription to exercise independently Short Term: Able to explain program exercise prescription;Long Term: Able to explain home exercise prescription to exercise independently              Exercise Goals Re-Evaluation :  Exercise Goals Re-Evaluation     Row Name 12/09/21 1034 01/06/22 1037           Exercise Goal Re-Evaluation   Exercise Goals Review Increase Physical Activity;Increase Strength and Stamina;Able to understand and use rate of perceived exertion (RPE) scale;Knowledge and understanding of Target Heart Rate Range (THRR);Able to check pulse independently;Understanding of Exercise  Prescription Increase Physical Activity;Increase Strength and Stamina;Able to understand and use rate of perceived exertion (RPE) scale;Knowledge and understanding of Target Heart Rate Range (THRR);Able to check pulse independently;Understanding of Exercise Prescription      Comments Pt has completed 5 sessions of cardiac rehab. He is motivated during class and eagar to improve his everyday activities. He is currently exercsising at 4.3 METs on the ellp. Will continue to montior and progress as able. Pt has completed 14 sessions of cardiac rehab. He is very motivated during class and pushes himself to increase his workload each time. He has started walking outside of class on the days off. He is currently exercising at 4.7 METs on the ellp. Will continue to monitor and progress as able.      Expected Outcomes Through exercise at home and at rehab, the patient will meet their stated goals. Through exercise at home and at rehab, the patient will meet their stated goals.                Discharge Exercise Prescription (Final Exercise Prescription Changes):  Exercise Prescription Changes - 01/06/22  1000       Response to Exercise   Blood Pressure (Admit) 104/64    Blood Pressure (Exercise) 110/58    Blood Pressure (Exit) 108/58    Heart Rate (Admit) 90 bpm    Heart Rate (Exercise) 113 bpm    Heart Rate (Exit) 96 bpm    Rating of Perceived Exertion (Exercise) 12    Duration Continue with 30 min of aerobic exercise without signs/symptoms of physical distress.    Intensity THRR unchanged      Progression   Progression Continue to progress workloads to maintain intensity without signs/symptoms of physical distress.      Resistance Training   Training Prescription Yes    Weight 4    Reps 10-15    Time 10 Minutes      Treadmill   MPH 2.3    Grade 1.3    Minutes 17    METs 3.08      Recumbant Elliptical   Level 4    RPM 62    Minutes 22    METs 4.7             Nutrition:   Target Goals: Understanding of nutrition guidelines, daily intake of sodium '1500mg'$ , cholesterol '200mg'$ , calories 30% from fat and 7% or less from saturated fats, daily to have 5 or more servings of fruits and vegetables.  Biometrics:  Pre Biometrics - 11/26/21 1016       Pre Biometrics   Height '5\' 11"'$  (1.803 m)    Weight 83.1 kg    Waist Circumference 38.5 inches    Hip Circumference 38 inches    Waist to Hip Ratio 1.01 %    BMI (Calculated) 25.56    Triceps Skinfold 10 mm    % Body Fat 24.2 %    Grip Strength 46.5 kg    Flexibility 12 in    Single Leg Stand 11 seconds              Nutrition Therapy Plan and Nutrition Goals:  Nutrition Therapy & Goals - 11/26/21 0823       Intervention Plan   Intervention Nutrition handout(s) given to patient.    Expected Outcomes Short Term Goal: Understand basic principles of dietary content, such as calories, fat, sodium, cholesterol and nutrients.             Nutrition Assessments:  Nutrition Assessments - 11/26/21 0823       MEDFICTS Scores   Pre Score 36            MEDIFICTS Score Key: ?70 Need to make dietary changes  40-70 Heart Healthy Diet ? 40 Therapeutic Level Cholesterol Diet   Picture Your Plate Scores: <40 Unhealthy dietary pattern with much room for improvement. 41-50 Dietary pattern unlikely to meet recommendations for good health and room for improvement. 51-60 More healthful dietary pattern, with some room for improvement.  >60 Healthy dietary pattern, although there may be some specific behaviors that could be improved.    Nutrition Goals Re-Evaluation:   Nutrition Goals Discharge (Final Nutrition Goals Re-Evaluation):   Psychosocial: Target Goals: Acknowledge presence or absence of significant depression and/or stress, maximize coping skills, provide positive support system. Participant is able to verbalize types and ability to use techniques and skills needed for reducing stress and  depression.  Initial Review & Psychosocial Screening:  Initial Psych Review & Screening - 11/26/21 0855       Initial Review   Current issues with None Identified  Family Dynamics   Good Support System? Yes    Comments His support system includes his two daughters.      Barriers   Psychosocial barriers to participate in program There are no identifiable barriers or psychosocial needs.      Screening Interventions   Interventions Encouraged to exercise    Expected Outcomes Long Term goal: The participant improves quality of Life and PHQ9 Scores as seen by post scores and/or verbalization of changes;Short Term goal: Identification and review with participant of any Quality of Life or Depression concerns found by scoring the questionnaire.             Quality of Life Scores:  Quality of Life - 11/26/21 1017       Quality of Life   Select Quality of Life      Quality of Life Scores   Health/Function Pre 26.17 %    Socioeconomic Pre 26.13 %    Psych/Spiritual Pre 23.31 %    Family Pre 30 %    GLOBAL Pre 26 %            Scores of 19 and below usually indicate a poorer quality of life in these areas.  A difference of  2-3 points is a clinically meaningful difference.  A difference of 2-3 points in the total score of the Quality of Life Index has been associated with significant improvement in overall quality of life, self-image, physical symptoms, and general health in studies assessing change in quality of life.  PHQ-9: Review Flowsheet       11/26/2021  Depression screen PHQ 2/9  Decreased Interest 1  Down, Depressed, Hopeless 0  PHQ - 2 Score 1  Altered sleeping 0  Tired, decreased energy 0  Change in appetite 0  Feeling bad or failure about yourself  0  Trouble concentrating 0  Moving slowly or fidgety/restless 0  Suicidal thoughts 0  PHQ-9 Score 1  Difficult doing work/chores Not difficult at all   Interpretation of Total Score  Total Score  Depression Severity:  1-4 = Minimal depression, 5-9 = Mild depression, 10-14 = Moderate depression, 15-19 = Moderately severe depression, 20-27 = Severe depression   Psychosocial Evaluation and Intervention:  Psychosocial Evaluation - 11/26/21 0918       Psychosocial Evaluation & Interventions   Interventions Encouraged to exercise with the program and follow exercise prescription    Comments Pt has no barriers to participating in CR. He has no identifiable psychosocial issues. He scored a 1 on his PHQ-9 and he relates this to his decreased energy levels since his STEMI. He reports that he has a good support system with his two daughter. He quit smoking 08/03/2021 when he had his STEMI. He would only smoke when he would drink alcohol, and states that he would smoke no more than a quarter of a pack per day. He has also quit drinking, and he states that he has had no problem giving up alcohol or smoking. His goals while in the program are to decrease his SOB with exertion, and to improve his strength and stamina. He is eager to begin the program. He is interested in setting up an advanced directive, and I have provided the chaplain with his information, so that they can schedule a meeting.    Expected Outcomes Pt will continue to have no identfiable psychosocial issues.    Continue Psychosocial Services  No Follow up required  Psychosocial Re-Evaluation:  Psychosocial Re-Evaluation     Row Name 12/02/21 4496 12/30/21 0732           Psychosocial Re-Evaluation   Current issues with None Identified None Identified      Comments Patient is new to the program starting today 5/15. He continues to have no psychosocial barriers identified. We will continue to monitor his progress in the program. Patient has completed 11 sessions. He continues to have no psychosocial barriers identified. He is doing well in the program enjoying the sessions. We will continue to monitor his progress in the  program.      Expected Outcomes Patient will continue to have no psychosocial barriers identified. Patient will continue to have no psychosocial barriers identified.      Interventions Stress management education;Relaxation education;Encouraged to attend Cardiac Rehabilitation for the exercise Stress management education;Relaxation education;Encouraged to attend Cardiac Rehabilitation for the exercise      Continue Psychosocial Services  No Follow up required No Follow up required               Psychosocial Discharge (Final Psychosocial Re-Evaluation):  Psychosocial Re-Evaluation - 12/30/21 0732       Psychosocial Re-Evaluation   Current issues with None Identified    Comments Patient has completed 11 sessions. He continues to have no psychosocial barriers identified. He is doing well in the program enjoying the sessions. We will continue to monitor his progress in the program.    Expected Outcomes Patient will continue to have no psychosocial barriers identified.    Interventions Stress management education;Relaxation education;Encouraged to attend Cardiac Rehabilitation for the exercise    Continue Psychosocial Services  No Follow up required             Vocational Rehabilitation: Provide vocational rehab assistance to qualifying candidates.   Vocational Rehab Evaluation & Intervention:  Vocational Rehab - 11/26/21 0859       Initial Vocational Rehab Evaluation & Intervention   Assessment shows need for Vocational Rehabilitation No      Vocational Rehab Re-Evaulation   Comments He is retired.             Education: Education Goals: Education classes will be provided on a weekly basis, covering required topics. Participant will state understanding/return demonstration of topics presented.  Learning Barriers/Preferences:  Learning Barriers/Preferences - 11/26/21 0856       Learning Barriers/Preferences   Learning Barriers None    Learning Preferences  Audio;Verbal Instruction             Education Topics: Hypertension, Hypertension Reduction -Define heart disease and high blood pressure. Discus how high blood pressure affects the body and ways to reduce high blood pressure.   Exercise and Your Heart -Discuss why it is important to exercise, the FITT principles of exercise, normal and abnormal responses to exercise, and how to exercise safely.   Angina -Discuss definition of angina, causes of angina, treatment of angina, and how to decrease risk of having angina.   Cardiac Medications -Review what the following cardiac medications are used for, how they affect the body, and side effects that may occur when taking the medications.  Medications include Aspirin, Beta blockers, calcium channel blockers, ACE Inhibitors, angiotensin receptor blockers, diuretics, digoxin, and antihyperlipidemics.   Congestive Heart Failure -Discuss the definition of CHF, how to live with CHF, the signs and symptoms of CHF, and how keep track of weight and sodium intake.   Heart Disease and Intimacy -Discus the effect sexual activity  has on the heart, how changes occur during intimacy as we age, and safety during sexual activity.   Smoking Cessation / COPD -Discuss different methods to quit smoking, the health benefits of quitting smoking, and the definition of COPD. Flowsheet Row CARDIAC REHAB PHASE II EXERCISE from 01/01/2022 in [place]  Date 12/04/21  Educator pb  Instruction Review Code 1- Verbalizes Understanding       Nutrition I: Fats -Discuss the types of cholesterol, what cholesterol does to the heart, and how cholesterol levels can be controlled. Flowsheet Row CARDIAC REHAB PHASE II EXERCISE from 01/01/2022 in [facility]  Date 12/11/21  Educator pb  Instruction Review Code 1- Verbalizes Understanding       Nutrition II: Labels -Discuss the different components of food labels and  how to read food label [place] from 01/01/2022 in [facility]  Date 12/25/21  Educator DF  Instruction Review Code 1- Verbalizes Understanding       Heart Parts/Heart Disease and PAD -Discuss the anatomy of the heart, the pathway of blood circulation through the heart, and these are affected by heart disease. Flowsheet Row CARDIAC REHAB PHASE II EXERCISE from 01/01/2022 in [place]  Date 01/01/22  Educator DF  Instruction Review Code 2- Demonstrated Understanding       Stress I: Signs and Symptoms -Discuss the causes of stress, how stress may lead to anxiety and depression, and ways to limit stress.   Stress II: Relaxation -Discuss different types of relaxation techniques to limit stress.   Warning Signs of Stroke / TIA -Discuss definition of a stroke, what the signs and symptoms are of a stroke, and how to identify when someone is having stroke.   Knowledge Questionnaire Score:  Knowledge Questionnaire Score - 11/26/21 0857       Knowledge Questionnaire Score   Pre Score 26/28             Core Components/Risk Factors/Patient Goals at Admission:  Personal Goals and Risk Factors at Admission - 11/26/21 0901       Core Components/Risk Factors/Patient Goals on Admission   Improve shortness of breath with ADL's Yes    Intervention Provide education, individualized exercise plan and daily activity instruction to help decrease symptoms of SOB with activities of daily living.    Expected Outcomes Short Term: Improve cardiorespiratory fitness to achieve a reduction of symptoms when performing ADLs;Long Term: Be able to perform more ADLs without symptoms or delay the onset of symptoms    Diabetes Yes    Intervention Provide education about signs/symptoms and action to take for hypo/hyperglycemia.;Provide education about proper nutrition, including hydration, and aerobic/resistive exercise  prescription along with prescribed medications to achieve blood glucose in normal ranges: Fasting glucose 65-99 mg/dL    Expected Outcomes Short Term: Participant verbalizes understanding of the signs/symptoms and immediate care of hyper/hypoglycemia, proper foot care and importance of medication, aerobic/resistive exercise and nutrition plan for blood glucose control.;Long Term: Attainment of HbA1C < 7%.    Heart Failure Yes    Intervention Provide a combined exercise and nutrition program that is supplemented with education, support and counseling about heart failure. Directed toward relieving symptoms such as shortness of breath, decreased exercise tolerance, and extremity edema.    Expected Outcomes Improve functional capacity of life;Short term: Attendance in program 2-3 days a week with increased exercise capacity. Reported lower sodium intake. Reported increased fruit and vegetable intake. Reports medication  compliance.;Short term: Daily weights obtained and reported for increase. Utilizing diuretic protocols set by physician.;Long term: Adoption of self-care skills and reduction of barriers for early signs and symptoms recognition and intervention leading to self-care maintenance.    Personal Goal Other Yes    Personal Goal improve strength and stamina    Intervention Attend CR three times per week and begin a home exercise program of 2-4 days per week    Expected Outcomes Pt will report gains in strength and stamina, and he will improve on his walk test distance.             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 He has multiple risk factors for CAD and is participating in the program for risk  modification. He started the program today 5/15. He last A1C was 7.8%. His DM is controlled with insulin and jardiance. His personal goals for the program are to decrease his SOB with activity and improve his strength and staminal. We will continue to monitor his progress as he works towards meeting these goals. Patient has completed 11 sesisons. His current weight is 183 lbs gaining 1 lb since the last 30 day review. His last A1C was 08/05/21 at 7.8%. He is doing well in the program with consistent attendance and progressions. His blood pressue is well controlled and soft at times. 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 It was decided to move forward with ICD implant which is scheduled 01/20/22. His personal goals for the program continue to be to decrease his SOB with activity and improve his strength and stamina. We will continue to monitor his progress as he works towards meeting these goals.      Expected Outcomes Patient will complete the program meeting both personal and program goals. Patient will complete the program meeting both personal and program goals.               Core Components/Risk Factors/Patient Goals at Discharge (Final Review):   Goals and Risk Factor Review - 12/30/21 0735       Core Components/Risk Factors/Patient Goals Review   Personal Goals Review Diabetes;Heart Failure;Other    Review Patient has completed 11 sesisons. His current weight is 183 lbs gaining 1 lb since the last 30 day review. His last A1C was 08/05/21 at 7.8%. He is doing well in the program with consistent attendance and progressions. His blood pressue is well controlled and soft at times. 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 It was decided to move forward with ICD implant which is scheduled 01/20/22. His personal goals for the program continue to be to decrease his SOB with activity and improve his strength and stamina.  We will continue to monitor his progress as he works towards meeting these goals.    Expected Outcomes Patient will complete the program meeting both personal and program goals.             ITP Comments:   Comments: ITP REVIEW Pt is making expected progress toward Cardiac Rehab goals after completing 14 sessions. Recommend continued exercise, life style modification, education, and increased stamina and strength.

## 2022-01-10 ENCOUNTER — Encounter (HOSPITAL_COMMUNITY): Payer: Medicare Other

## 2022-01-13 ENCOUNTER — Encounter (HOSPITAL_COMMUNITY): Payer: Medicare Other

## 2022-01-15 ENCOUNTER — Encounter (HOSPITAL_COMMUNITY): Payer: Medicare Other

## 2022-01-17 ENCOUNTER — Encounter (HOSPITAL_COMMUNITY): Payer: Medicare Other

## 2022-01-17 NOTE — Pre-Procedure Instructions (Signed)
Instructed patient on the following items: Arrival time 0530 Nothing to eat or drink after midnight No meds AM of procedure Responsible person to drive you home and stay with you for 24 hrs Wash with special soap night before and morning of procedure Patient on Plavix- don't take doses on Sunday

## 2022-01-20 ENCOUNTER — Ambulatory Visit (HOSPITAL_COMMUNITY): Payer: Medicare Other

## 2022-01-20 ENCOUNTER — Ambulatory Visit (HOSPITAL_COMMUNITY)
Admission: RE | Admit: 2022-01-20 | Discharge: 2022-01-20 | Disposition: A | Discharge status: Home / Self Care | Payer: Medicare Other | Attending: Internal Medicine | Admitting: Internal Medicine

## 2022-01-20 ENCOUNTER — Encounter (HOSPITAL_COMMUNITY): Payer: Self-pay | Admitting: Internal Medicine

## 2022-01-20 ENCOUNTER — Other Ambulatory Visit: Payer: Self-pay

## 2022-01-20 ENCOUNTER — Encounter (HOSPITAL_COMMUNITY)
Admission: RE | Disposition: A | Discharge status: Home / Self Care | Payer: Medicare Other | Source: Home / Self Care | Attending: Internal Medicine

## 2022-01-20 ENCOUNTER — Encounter (HOSPITAL_COMMUNITY): Payer: Medicare Other

## 2022-01-20 ENCOUNTER — Ambulatory Visit (HOSPITAL_COMMUNITY): Payer: Medicare Other | Admitting: Anesthesiology

## 2022-01-20 ENCOUNTER — Ambulatory Visit (HOSPITAL_BASED_OUTPATIENT_CLINIC_OR_DEPARTMENT_OTHER): Payer: Medicare Other | Admitting: Anesthesiology

## 2022-01-20 DIAGNOSIS — I252 Old myocardial infarction: Secondary | ICD-10-CM | POA: Diagnosis not present

## 2022-01-20 DIAGNOSIS — N1831 Chronic kidney disease, stage 3a: Secondary | ICD-10-CM | POA: Diagnosis not present

## 2022-01-20 DIAGNOSIS — B1920 Unspecified viral hepatitis C without hepatic coma: Secondary | ICD-10-CM | POA: Diagnosis not present

## 2022-01-20 DIAGNOSIS — I255 Ischemic cardiomyopathy: Secondary | ICD-10-CM | POA: Insufficient documentation

## 2022-01-20 DIAGNOSIS — E1122 Type 2 diabetes mellitus with diabetic chronic kidney disease: Secondary | ICD-10-CM | POA: Diagnosis not present

## 2022-01-20 DIAGNOSIS — I2510 Atherosclerotic heart disease of native coronary artery without angina pectoris: Secondary | ICD-10-CM

## 2022-01-20 DIAGNOSIS — Z87891 Personal history of nicotine dependence: Secondary | ICD-10-CM | POA: Insufficient documentation

## 2022-01-20 DIAGNOSIS — Z955 Presence of coronary angioplasty implant and graft: Secondary | ICD-10-CM | POA: Diagnosis not present

## 2022-01-20 DIAGNOSIS — Z794 Long term (current) use of insulin: Secondary | ICD-10-CM | POA: Diagnosis not present

## 2022-01-20 DIAGNOSIS — I429 Cardiomyopathy, unspecified: Secondary | ICD-10-CM

## 2022-01-20 DIAGNOSIS — Z79899 Other long term (current) drug therapy: Secondary | ICD-10-CM | POA: Insufficient documentation

## 2022-01-20 DIAGNOSIS — I5022 Chronic systolic (congestive) heart failure: Secondary | ICD-10-CM | POA: Insufficient documentation

## 2022-01-20 DIAGNOSIS — Z7984 Long term (current) use of oral hypoglycemic drugs: Secondary | ICD-10-CM | POA: Insufficient documentation

## 2022-01-20 DIAGNOSIS — Z95810 Presence of automatic (implantable) cardiac defibrillator: Secondary | ICD-10-CM | POA: Diagnosis not present

## 2022-01-20 DIAGNOSIS — I10 Essential (primary) hypertension: Secondary | ICD-10-CM | POA: Diagnosis not present

## 2022-01-20 HISTORY — PX: SUBQ ICD IMPLANT: EP1223

## 2022-01-20 HISTORY — PX: CARDIAC DEFIBRILLATOR PLACEMENT: SHX171

## 2022-01-20 LAB — GLUCOSE, CAPILLARY
Glucose-Capillary: 99 mg/dL (ref 70–99)
Glucose-Capillary: 80 mg/dL (ref 70–99)
Glucose-Capillary: 87 mg/dL (ref 70–99)
Glucose-Capillary: 117 mg/dL — ABNORMAL HIGH (ref 70–99)

## 2022-01-20 SURGERY — SUBQ ICD IMPLANT
Anesthesia: General

## 2022-01-20 MED ORDER — ACETAMINOPHEN 500 MG PO TABS
1000.0000 mg | ORAL_TABLET | Freq: Once | ORAL | Status: AC
  Administered 2022-01-20: 1000 mg via ORAL
  Filled 2022-01-20: qty 2

## 2022-01-20 MED ORDER — DEXAMETHASONE SODIUM PHOSPHATE 10 MG/ML IJ SOLN
INTRAMUSCULAR | Status: DC | PRN
  Administered 2022-01-20: 5 mg via INTRAVENOUS

## 2022-01-20 MED ORDER — PROPOFOL 10 MG/ML IV BOLUS
INTRAVENOUS | Status: DC | PRN
  Administered 2022-01-20: 80 mg via INTRAVENOUS

## 2022-01-20 MED ORDER — PHENYLEPHRINE HCL-NACL 20-0.9 MG/250ML-% IV SOLN
INTRAVENOUS | Status: DC | PRN
  Administered 2022-01-20: 20 ug/min via INTRAVENOUS

## 2022-01-20 MED ORDER — SUGAMMADEX SODIUM 200 MG/2ML IV SOLN
INTRAVENOUS | Status: DC | PRN
  Administered 2022-01-20: 200 mg via INTRAVENOUS

## 2022-01-20 MED ORDER — SODIUM CHLORIDE 0.9 % IV SOLN
80.0000 mg | INTRAVENOUS | Status: AC
  Administered 2022-01-20: 80 mg

## 2022-01-20 MED ORDER — ROCURONIUM BROMIDE 10 MG/ML (PF) SYRINGE
PREFILLED_SYRINGE | INTRAVENOUS | Status: DC | PRN
  Administered 2022-01-20: 20 mg via INTRAVENOUS
  Administered 2022-01-20: 60 mg via INTRAVENOUS

## 2022-01-20 MED ORDER — BUPIVACAINE HCL (PF) 0.25 % IJ SOLN
INTRAMUSCULAR | Status: AC
  Filled 2022-01-20: qty 60

## 2022-01-20 MED ORDER — CEFAZOLIN SODIUM-DEXTROSE 2-4 GM/100ML-% IV SOLN
INTRAVENOUS | Status: AC
  Filled 2022-01-20: qty 100

## 2022-01-20 MED ORDER — HEPARIN (PORCINE) IN NACL 1000-0.9 UT/500ML-% IV SOLN
INTRAVENOUS | Status: DC | PRN
  Administered 2022-01-20: 500 mL

## 2022-01-20 MED ORDER — CEFAZOLIN SODIUM-DEXTROSE 2-4 GM/100ML-% IV SOLN
2.0000 g | INTRAVENOUS | Status: AC
  Administered 2022-01-20: 2 g via INTRAVENOUS

## 2022-01-20 MED ORDER — ONDANSETRON HCL 4 MG/2ML IJ SOLN: 4.0000 mg | Freq: Four times a day (QID) | INTRAMUSCULAR | Status: DC | PRN

## 2022-01-20 MED ORDER — FENTANYL CITRATE (PF) 250 MCG/5ML IJ SOLN
INTRAMUSCULAR | Status: DC | PRN
  Administered 2022-01-20 (×3): 50 ug via INTRAVENOUS

## 2022-01-20 MED ORDER — HEPARIN (PORCINE) IN NACL 1000-0.9 UT/500ML-% IV SOLN
INTRAVENOUS | Status: AC
  Filled 2022-01-20: qty 500

## 2022-01-20 MED ORDER — BUPIVACAINE HCL (PF) 0.25 % IJ SOLN
INTRAMUSCULAR | Status: DC | PRN
  Administered 2022-01-20: 75 mL

## 2022-01-20 MED ORDER — LACTATED RINGERS IV SOLN: INTRAVENOUS | Status: DC | PRN

## 2022-01-20 MED ORDER — SODIUM CHLORIDE 0.9 % IV SOLN
INTRAVENOUS | Status: AC
  Filled 2022-01-20: qty 2

## 2022-01-20 MED ORDER — PHENYLEPHRINE 80 MCG/ML (10ML) SYRINGE FOR IV PUSH (FOR BLOOD PRESSURE SUPPORT)
PREFILLED_SYRINGE | INTRAVENOUS | Status: DC | PRN
  Administered 2022-01-20: 80 ug via INTRAVENOUS

## 2022-01-20 MED ORDER — CHLORHEXIDINE GLUCONATE 4 % EX LIQD: 4.0000 | Freq: Once | CUTANEOUS | Status: DC

## 2022-01-20 MED ORDER — ACETAMINOPHEN 325 MG PO TABS: 325.0000 mg | ORAL_TABLET | ORAL | Status: DC | PRN

## 2022-01-20 MED ORDER — SODIUM CHLORIDE 0.9 % IV SOLN: INTRAVENOUS | Status: DC

## 2022-01-20 MED ORDER — ONDANSETRON HCL 4 MG/2ML IJ SOLN
INTRAMUSCULAR | Status: DC | PRN
  Administered 2022-01-20: 4 mg via INTRAVENOUS

## 2022-01-20 MED ORDER — BUPIVACAINE HCL (PF) 0.25 % IJ SOLN
INTRAMUSCULAR | Status: AC
  Filled 2022-01-20: qty 30

## 2022-01-20 MED ORDER — LIDOCAINE 2% (20 MG/ML) 5 ML SYRINGE
INTRAMUSCULAR | Status: DC | PRN
  Administered 2022-01-20: 40 mg via INTRAVENOUS

## 2022-01-20 MED ORDER — DEXTROSE-NACL 5-0.9 % IV SOLN: INTRAVENOUS | Status: DC

## 2022-01-20 SURGICAL SUPPLY — 7 items
BLANKET WARM UNDERBOD FULL ACC (MISCELLANEOUS) ×1 IMPLANT
CABLE SURGICAL S-101-97-12 (CABLE) ×3 IMPLANT
HEMOSTAT SURGICEL 2X4 FIBR (HEMOSTASIS) ×1 IMPLANT
ICD SUBCU MRI EMBLEM A219 (ICD Generator) ×1 IMPLANT
LEAD SUBQU EMBLEM 3501 (Pacemaker) ×1 IMPLANT
PAD DEFIB RADIO PHYSIO CONN (PAD) ×4 IMPLANT
TRAY PACEMAKER INSERTION (PACKS) ×3 IMPLANT

## 2022-01-20 NOTE — Transfer of Care (Signed)
Immediate Anesthesia Transfer of Care Note  Patient: Jose Clark  Procedure(s) Performed: SUBQ ICD IMPLANT  Patient Location: Cath Lab  Anesthesia Type:General  Level of Consciousness: awake, alert  and oriented  Airway & Oxygen Therapy: Patient Spontanous Breathing  Post-op Assessment: Report given to RN, Post -op Vital signs reviewed and stable and Patient moving all extremities  Post vital signs: Reviewed and stable  Last Vitals:  Vitals Value Taken Time  BP 119/75 01/20/22 1010  Temp 36.7 C 01/20/22 1011  Pulse    Resp 18 01/20/22 1014  SpO2    Vitals shown include unvalidated device data.  Last Pain:  Vitals:   01/20/22 1011  TempSrc: Temporal  PainSc: Asleep      Patients Stated Pain Goal: 4 (76/39/43 2003)  Complications: No notable events documented.

## 2022-01-20 NOTE — Anesthesia Preprocedure Evaluation (Addendum)
Anesthesia Evaluation  Patient identified by MRN, date of birth, ID band Patient awake    Reviewed: Allergy & Precautions, NPO status , Patient's Chart, lab work & pertinent test results  Airway Mallampati: I       Dental  (+) Edentulous Upper, Edentulous Lower   Pulmonary former smoker,    breath sounds clear to auscultation       Cardiovascular hypertension, Pt. on home beta blockers and Pt. on medications + CAD and + Past MI   Rhythm:Regular Rate:Normal  Echo: 1. Left ventricular ejection fraction, by estimation, is 30 to 35%. The  left ventricle has moderately decreased function. The left ventricle  demonstrates global hypokinesis. The left ventricular internal cavity size  was mildly dilated. Left ventricular  diastolic parameters are consistent with Grade I diastolic dysfunction  (impaired relaxation). The average left ventricular global longitudinal  strain is -13.0 %. The global longitudinal strain is abnormal.  2. Right ventricular systolic function is mildly reduced. The right  ventricular size is normal. Tricuspid regurgitation signal is inadequate  for assessing PA pressure.  3. The mitral valve is grossly normal. Mild mitral valve regurgitation.  4. The aortic valve is grossly normal. There is mild calcification of the  aortic valve. Aortic valve regurgitation is not visualized.  5. The inferior vena cava is normal in size with greater than 50%  respiratory variability, suggesting right atrial pressure of 3 mmHg.    Neuro/Psych negative neurological ROS     GI/Hepatic negative GI ROS, (+) Hepatitis -, C  Endo/Other  diabetes  Renal/GU Renal disease     Musculoskeletal   Abdominal Normal abdominal exam  (+)   Peds  Hematology negative hematology ROS (+)   Anesthesia Other Findings   Reproductive/Obstetrics                           Lab Results  Component Value Date    WBC 6.6 12/25/2021   HGB 15.8 12/25/2021   HCT 45.5 12/25/2021   MCV 91 12/25/2021   PLT 172 12/25/2021   Lab Results  Component Value Date   CREATININE 1.28 (H) 12/25/2021   BUN 16 12/25/2021   NA 136 12/25/2021   K 5.1 12/25/2021   CL 104 12/25/2021   CO2 26 12/25/2021    Anesthesia Physical Anesthesia Plan  ASA: 3  Anesthesia Plan: General   Post-op Pain Management: Tylenol PO (pre-op)* and Minimal or no pain anticipated   Induction: Intravenous  PONV Risk Score and Plan: 2 and Dexamethasone, Ondansetron and Treatment may vary due to age or medical condition  Airway Management Planned: Oral ETT  Additional Equipment:   Intra-op Plan:   Post-operative Plan: Extubation in OR  Informed Consent: I have reviewed the patients History and Physical, chart, labs and discussed the procedure including the risks, benefits and alternatives for the proposed anesthesia with the patient or authorized representative who has indicated his/her understanding and acceptance.     Dental advisory given  Plan Discussed with: CRNA  Anesthesia Plan Comments:         Anesthesia Quick Evaluation

## 2022-01-20 NOTE — Interval H&P Note (Signed)
History and Physical Interval Note:  01/20/2022 7:14 AM  Jose Clark  has presented today for surgery, with the diagnosis of cardiomyopathy.  The various methods of treatment have been discussed with the patient and family. After consideration of risks, benefits and other options for treatment, the patient has consented to  Procedure(s): SUBQ ICD IMPLANT (N/A) as a surgical intervention.  The patient's history has been reviewed, patient examined, no change in status, stable for surgery.  I have reviewed the patient's chart and labs.  Questions were answered to the patient's satisfaction.     Virl Axe

## 2022-01-20 NOTE — Anesthesia Procedure Notes (Signed)
Procedure Name: Intubation Date/Time: 01/20/2022 7:50 AM  Performed by: Amadeo Garnet, CRNAPre-anesthesia Checklist: Patient identified, Emergency Drugs available, Suction available and Patient being monitored Patient Re-evaluated:Patient Re-evaluated prior to induction Oxygen Delivery Method: Circle system utilized Preoxygenation: Pre-oxygenation with 100% oxygen Induction Type: IV induction Ventilation: Mask ventilation without difficulty Laryngoscope Size: Mac and 4 Grade View: Grade I Tube type: Oral Tube size: 7.5 mm Number of attempts: 1 Airway Equipment and Method: Stylet and Oral airway Placement Confirmation: ETT inserted through vocal cords under direct vision, positive ETCO2 and breath sounds checked- equal and bilateral Secured at: 22 cm Tube secured with: Tape Dental Injury: Teeth and Oropharynx as per pre-operative assessment

## 2022-01-20 NOTE — Anesthesia Postprocedure Evaluation (Signed)
Anesthesia Post Note  Patient: Jose Clark  Procedure(s) Performed: SUBQ ICD IMPLANT     Patient location during evaluation: PACU Anesthesia Type: General Level of consciousness: awake and alert Pain management: pain level controlled Vital Signs Assessment: post-procedure vital signs reviewed and stable Respiratory status: spontaneous breathing, nonlabored ventilation, respiratory function stable and patient connected to nasal cannula oxygen Cardiovascular status: blood pressure returned to baseline and stable Postop Assessment: no apparent nausea or vomiting Anesthetic complications: no   No notable events documented.  Last Vitals:  Vitals:   01/20/22 1115 01/20/22 1130  BP: 121/76 (!) 121/94  Pulse: 76 81  Resp: 17 (!) 21  Temp:    SpO2: 99% 97%    Last Pain:  Vitals:   01/20/22 1042  TempSrc: Temporal  PainSc: 0-No pain                 Effie Berkshire

## 2022-01-20 NOTE — Progress Notes (Signed)
Dr Caryl Comes and Joseph Art in to see client and ok to d/c home

## 2022-01-20 NOTE — Addendum Note (Signed)
Addendum  created 01/20/22 1623 by Freddrick March, MD   Attestation recorded in [place], [place] filed

## 2022-01-21 ENCOUNTER — Encounter (HOSPITAL_COMMUNITY): Payer: Self-pay

## 2022-01-21 ENCOUNTER — Other Ambulatory Visit: Payer: Self-pay

## 2022-01-21 ENCOUNTER — Emergency Department (HOSPITAL_COMMUNITY)
Admission: EM | Admit: 2022-01-21 | Discharge: 2022-01-21 | Disposition: A | Discharge status: Home / Self Care | Payer: Medicare Other | Attending: Emergency Medicine | Admitting: Emergency Medicine

## 2022-01-21 DIAGNOSIS — G8918 Other acute postprocedural pain: Secondary | ICD-10-CM | POA: Diagnosis not present

## 2022-01-21 DIAGNOSIS — Z794 Long term (current) use of insulin: Secondary | ICD-10-CM | POA: Insufficient documentation

## 2022-01-21 DIAGNOSIS — Z7982 Long term (current) use of aspirin: Secondary | ICD-10-CM | POA: Insufficient documentation

## 2022-01-21 DIAGNOSIS — I2510 Atherosclerotic heart disease of native coronary artery without angina pectoris: Secondary | ICD-10-CM | POA: Diagnosis not present

## 2022-01-21 DIAGNOSIS — E1122 Type 2 diabetes mellitus with diabetic chronic kidney disease: Secondary | ICD-10-CM | POA: Insufficient documentation

## 2022-01-21 DIAGNOSIS — N189 Chronic kidney disease, unspecified: Secondary | ICD-10-CM | POA: Insufficient documentation

## 2022-01-21 DIAGNOSIS — I509 Heart failure, unspecified: Secondary | ICD-10-CM | POA: Diagnosis not present

## 2022-01-21 DIAGNOSIS — R0789 Other chest pain: Secondary | ICD-10-CM | POA: Insufficient documentation

## 2022-01-21 LAB — CBG MONITORING, ED: Glucose-Capillary: 109 mg/dL — ABNORMAL HIGH (ref 70–99)

## 2022-01-21 MED ORDER — HYDROCODONE-ACETAMINOPHEN 5-325 MG PO TABS: 1.0000 | ORAL_TABLET | Freq: Four times a day (QID) | ORAL | 0 refills | Status: DC | PRN

## 2022-01-21 MED ORDER — OXYCODONE-ACETAMINOPHEN 5-325 MG PO TABS
1.0000 | ORAL_TABLET | Freq: Once | ORAL | Status: AC
  Administered 2022-01-21: 1 via ORAL
  Filled 2022-01-21: qty 1

## 2022-01-21 NOTE — ED Provider Notes (Signed)
[facility] EMERGENCY DEPARTMENT Provider Note   CSN: [id number] Arrival date & time: 01/21/22  6384     History  Chief Complaint  Patient presents with   Post-op Problem    Jose Clark is a 71 y.o. male.  HPI Patient presents for postoperative pain.  Medical history includes DM, CAD, CHF, CKD.  Yesterday, he underwent a ICD implant.  During this procedure, he underwent incisions to his left lateral and anterior chest.  Wounds were dressed with gauze and Tegaderm.  He was advised to treat postoperative pain with over-the-counter medications.  He presents to the ED today due to concern of serosanguineous drainage on gauze dressing.  Drainage has not leaked out of the surrounding Tegaderm.  He also states that he has postoperative pain that is not currently controlled with over-the-counter medications.  Last over-the-counter medications were taken last night.  Patient denies any areas of discomfort other than his surgical incision sites.  He denies any associated symptoms.    Home Medications Prior to Admission medications   Medication Sig Start Date End Date Taking? Authorizing Provider  HYDROcodone-acetaminophen (NORCO/VICODIN) 5-325 MG tablet Take 1 tablet by mouth every 6 (six) hours as needed for up to 12 doses for severe pain. 01/21/22  Yes Godfrey Pick, MD  aspirin EC 81 MG tablet Take 1 tablet (81 mg total) by mouth daily. Swallow whole. 08/06/21 08/06/22  Leanor Kail, PA  atorvastatin (LIPITOR) 80 MG tablet Take 1 tablet (80 mg total) by mouth daily. 08/07/21   Bhagat, Crista Luria, PA  empagliflozin (JARDIANCE) 25 MG TABS tablet Take 25 mg by mouth daily.    [provider]  furosemide (LASIX) 20 MG tablet Take 1 tablet (20 mg total) by mouth daily as needed for fluid or edema. Patient not taking: Reported on 01/10/2022 08/06/21 08/06/22  Leanor Kail, PA  insulin glargine (LANTUS) 100 unit/mL SOPN Inject 40 Units into the skin at bedtime.    [provider]  insulin NPH-insulin regular (NOVOLIN 70/30) (70-30) 100 UNIT/ML injection Inject 10-25 Units into the skin daily as needed (CBG >180).    [provider]  metoprolol succinate (TOPROL-XL) 50 MG 24 hr tablet Take 1 tablet (50 mg total) by mouth daily. 11/12/21   Larey Dresser, MD  nitroGLYCERIN (NITROSTAT) 0.4 MG SL tablet Place 1 tablet (0.4 mg total) under the tongue every 5 (five) minutes as needed. 08/06/21   Bhagat, Bhavinkumar, PA  RYBELSUS 7 MG TABS Take 7 mg by mouth daily. 09/17/21   [provider]  sacubitril-valsartan (ENTRESTO) 49-51 MG Take 1 tablet by mouth 2 (two) times daily. 10/03/21   Larey Dresser, MD  spironolactone (ALDACTONE) 25 MG tablet Take 1 tablet (25 mg total) by mouth daily. 11/12/21   Larey Dresser, MD  ticagrelor (BRILINTA) 90 MG TABS tablet Take 1 tablet (90 mg total) by mouth 2 (two) times daily. 08/06/21   Leanor Kail, PA      Allergies    Patient has no known allergies.    Review of Systems   Review of Systems  Skin:  Positive for wound (Operative).  All other systems reviewed and are negative.   Physical Exam Updated Vital Signs BP 116/79   Pulse 66   Temp 97.9 F (36.6 C) (Oral)   Resp 16   Ht '5\' 11"'$  (1.803 m)   Wt 83 kg   SpO2 100%   BMI 25.52 kg/m  Physical Exam Vitals and nursing note reviewed.  Constitutional:  General: He is not in acute distress.    Appearance: Normal appearance. He is well-developed and normal weight. He is not ill-appearing, toxic-appearing or diaphoretic.  HENT:     Head: Normocephalic and atraumatic.     Right Ear: External ear normal.     Left Ear: External ear normal.     Nose: Nose normal.     Mouth/Throat:     Mouth: Mucous membranes are moist.     Pharynx: Oropharynx is clear.  Eyes:     General: No scleral icterus.    Extraocular Movements: Extraocular movements intact.     Conjunctiva/sclera: Conjunctivae normal.  Cardiovascular:     Rate and Rhythm:  Normal rate and regular rhythm.     Heart sounds: No murmur heard. Pulmonary:     Effort: Pulmonary effort is normal. No respiratory distress.     Breath sounds: Normal breath sounds.     Comments: Incisions on left lateral and anterior chest, expected serosanguineous drainage, no surrounding erythema, expected postoperative tenderness Chest:     Chest wall: Tenderness present.  Abdominal:     Palpations: Abdomen is soft.     Tenderness: There is no abdominal tenderness.  Musculoskeletal:        General: No swelling. Normal range of motion.     Cervical back: Normal range of motion and neck supple.  Skin:    General: Skin is warm and dry.     Coloration: Skin is not jaundiced or pale.  Neurological:     General: No focal deficit present.     Mental Status: He is alert and oriented to person, place, and time.     Cranial Nerves: No cranial nerve deficit.     Sensory: No sensory deficit.     Motor: No weakness.     Coordination: Coordination normal.  Psychiatric:        Mood and Affect: Mood normal.        Behavior: Behavior normal.        Thought Content: Thought content normal.        Judgment: Judgment normal.      ED Results / Procedures / Treatments   Labs (all labs ordered are listed, but only abnormal results are displayed) Labs Reviewed  CBG MONITORING, ED - Abnormal; Notable for the following components:      Result Value   Glucose-Capillary 109 (*)    All other components within normal limits    EKG None  Radiology DG Chest 2 View  Result Date: 01/20/2022 CLINICAL DATA:  Status post ICD implant. EXAM: CHEST - 2 VIEW COMPARISON:  08/03/2021 FINDINGS: Interval placement tunneled ICD with lead along the midline of the anterior chest wall. The ICD generator is in the left lateral chest wall. The lead tip terminates just above the sternal manubrial joint. Heart size appears normal. No pleural effusion or edema. No airspace opacity. No pneumothorax identified.  IMPRESSION: Status post tunneled ICD placement. Electronically Signed   By: Kerby Moors M.D.   On: 01/20/2022 13:37    Procedures Procedures    Medications Ordered in ED Medications  oxyCODONE-acetaminophen (PERCOCET/ROXICET) 5-325 MG per tablet 1 tablet (1 tablet Oral Given 01/21/22 1201)    ED Course/ Medical Decision Making/ A&P                           Medical Decision Making Risk Prescription drug management.   Patient presents for wound check of his recent  surgical incisions.  He underwent ICD placement yesterday.  He has incisions to his anterior chest.  He has been in conversation with his surgeon who instructed him to remove dressings today.  He did not feel comfortable doing this at home.  He was also concerned about some drainage from the left lateral chest wall incision.  On arrival in the ED, patient has normal vital signs.  He is well-appearing on exam.  There is some evidence of small serosanguineous drainage from his lateral surgical incision.  This is soaking through gauze but does not appear to be leaking from under Tegaderm.  Overlying dressings were removed and incisions appear to be healing well.  There are Steri-Strips in place.  No active drainage is identified.  There is no surrounding erythema.  Tenderness is expected in the setting of postop day 1.  Patient was given dose of Percocet for analgesia.  Nonadherent gauze and Tegaderm were placed over his incisions for protection.  He did request a short course of narcotic pain medication to be used as needed at home.  PDMP score was 0 and I feel this is reasonable in the setting of recent surgery.  Patient was advised to follow-up with his surgeon, Dr. Caryl Comes for any further guidance on wound management and pain control.  He was discharged in good condition.        Final Clinical Impression(s) / ED Diagnoses Final diagnoses:  Post-operative pain    Rx / DC Orders ED Discharge Orders          Ordered     HYDROcodone-acetaminophen (NORCO/VICODIN) 5-325 MG tablet  Every 6 hours PRN        01/21/22 1321              Godfrey Pick, MD 01/21/22 1743

## 2022-01-21 NOTE — Discharge Instructions (Addendum)
Continue over-the-counter pain medicine as needed.  Additionally, there was a prescription for narcotic pain medicine was sent to your pharmacy.  Take this for pain that is uncontrolled with the over-the-counter medicine.  Talk to your surgeon to discuss any further postoperative pain or concerns about drainage.  Return to the emergency department for any new symptoms of concern.

## 2022-01-21 NOTE — ED Notes (Signed)
Pt requesting bs to be checked

## 2022-01-21 NOTE — ED Notes (Signed)
Attempted to sign discharge papers but pad not working

## 2022-01-21 NOTE — ED Notes (Signed)
MSE verbal consent . Pad not working

## 2022-01-21 NOTE — ED Triage Notes (Signed)
Pt c/o of post op pain to left chest from defib placement yesterday and wants dressing changed.

## 2022-01-22 ENCOUNTER — Encounter (HOSPITAL_COMMUNITY): Payer: Self-pay | Admitting: Internal Medicine

## 2022-01-22 ENCOUNTER — Encounter (HOSPITAL_COMMUNITY): Payer: Medicare Other

## 2022-01-24 ENCOUNTER — Encounter (HOSPITAL_COMMUNITY): Payer: Medicare Other

## 2022-01-27 ENCOUNTER — Telehealth: Payer: Self-pay

## 2022-01-27 ENCOUNTER — Encounter (HOSPITAL_COMMUNITY): Payer: Medicare Other

## 2022-01-27 NOTE — Telephone Encounter (Signed)
Follow-up after same day discharge: Implant date: 01/20/22 MD: Virl Axe, MD Device: S-ICD Location: left side   Wound check visit: 01/30/22 at 12:00 90 day MD follow-up: 04/24/22 at 1:45  Remote Transmission received:yes  Dressing/sling removed: yes  Confirm OAC restart on: on brillinta  Unsuccessful telephone encounter to patient to follow up post S-ICD implant. Unable to leave message as mailbox is full. Patient is scheduled for wound check this week, 01/30/22 and will follow up at that time.

## 2022-01-27 NOTE — Addendum Note (Signed)
Encounter addended by: Dwana Melena, RN on: 01/27/2022 7:41 AM  Actions taken: Flowsheet data copied forward, Flowsheet accepted

## 2022-01-28 DIAGNOSIS — E1165 Type 2 diabetes mellitus with hyperglycemia: Secondary | ICD-10-CM | POA: Diagnosis not present

## 2022-01-29 ENCOUNTER — Encounter (HOSPITAL_COMMUNITY): Payer: Medicare Other

## 2022-01-30 ENCOUNTER — Ambulatory Visit (INDEPENDENT_AMBULATORY_CARE_PROVIDER_SITE_OTHER): Payer: Medicare Other

## 2022-01-30 DIAGNOSIS — I255 Ischemic cardiomyopathy: Secondary | ICD-10-CM

## 2022-01-30 LAB — CUP PACEART INCLINIC DEVICE CHECK
Date Time Interrogation Session: 20230713121811
Implantable Lead Implant Date: 20230703
Implantable Lead Location: 753862
Implantable Lead Model: 3501
Implantable Lead Serial Number: 235028
Implantable Pulse Generator Implant Date: 20230703
Pulse Gen Serial Number: 174730

## 2022-01-30 NOTE — Patient Instructions (Signed)
   After Your ICD (Implantable Cardiac Defibrillator)    Monitor your defibrillator site for redness, swelling, and drainage. Call the device clinic at [phone] if you experience these symptoms or fever/chills.  Your incision was closed with Steri-strips or staples:  You may shower 7 days after your procedure and wash your incision with soap and water. Avoid lotions, ointments, or perfumes over your incision until it is well-healed.    You may use a hot tub or a pool after your wound check appointment if the incision is completely closed.  Do not lift, push or pull greater than 10 pounds with the affected arm until 6 weeks after your procedure. There are no other restrictions in arm movement after your wound check appointment.  Your ICD is MRI compatible.  Your ICD is designed to protect you from life threatening heart rhythms. Because of this, you may receive a shock.   1 shock with no symptoms:  Call the office during business hours. 1 shock with symptoms (chest pain, chest pressure, dizziness, lightheadedness, shortness of breath, overall feeling unwell):  Call 911. If you experience 2 or more shocks in 24 hours:  Call 911. If you receive a shock, you should not drive.  [place] DMV - no driving for 6 months if you receive appropriate therapy from your ICD.   ICD Alerts:  Some alerts are vibratory and others beep. These are NOT emergencies. Please call our office to let us know. If this occurs at night or on weekends, it can wait until the next business day. Send a remote transmission.  If your device is capable of reading fluid status (for heart failure), you will be offered monthly monitoring to review this with you.   Remote monitoring is used to monitor your ICD from home. This monitoring is scheduled every 91 days by our office. It allows us to keep an eye on the functioning of your device to ensure it is working properly. You will routinely see your Electrophysiologist annually  (more often if necessary).   

## 2022-01-30 NOTE — Progress Notes (Signed)
Wound check appointment. Steri-strips removed. Wound without redness or edema. Incision edges approximated, wound well healed. Normal device function. Subcutaneous ICD check in clinic. 0 untreated episodes; 0 treated episodes; 0 shocks delivered. Electrode impedance status okay. No programming changes. Remaining longevity to ERI 100%.

## 2022-01-31 ENCOUNTER — Encounter (HOSPITAL_COMMUNITY): Payer: Medicare Other

## 2022-01-31 IMAGING — DX DG CHEST 1V PORT
1 series · 1 of 1 positions shown · non-contrast
Comparison: 08/19/2018

CLINICAL DATA: Fevers

EXAM:
PORTABLE CHEST 1 VIEW

[chest ap]
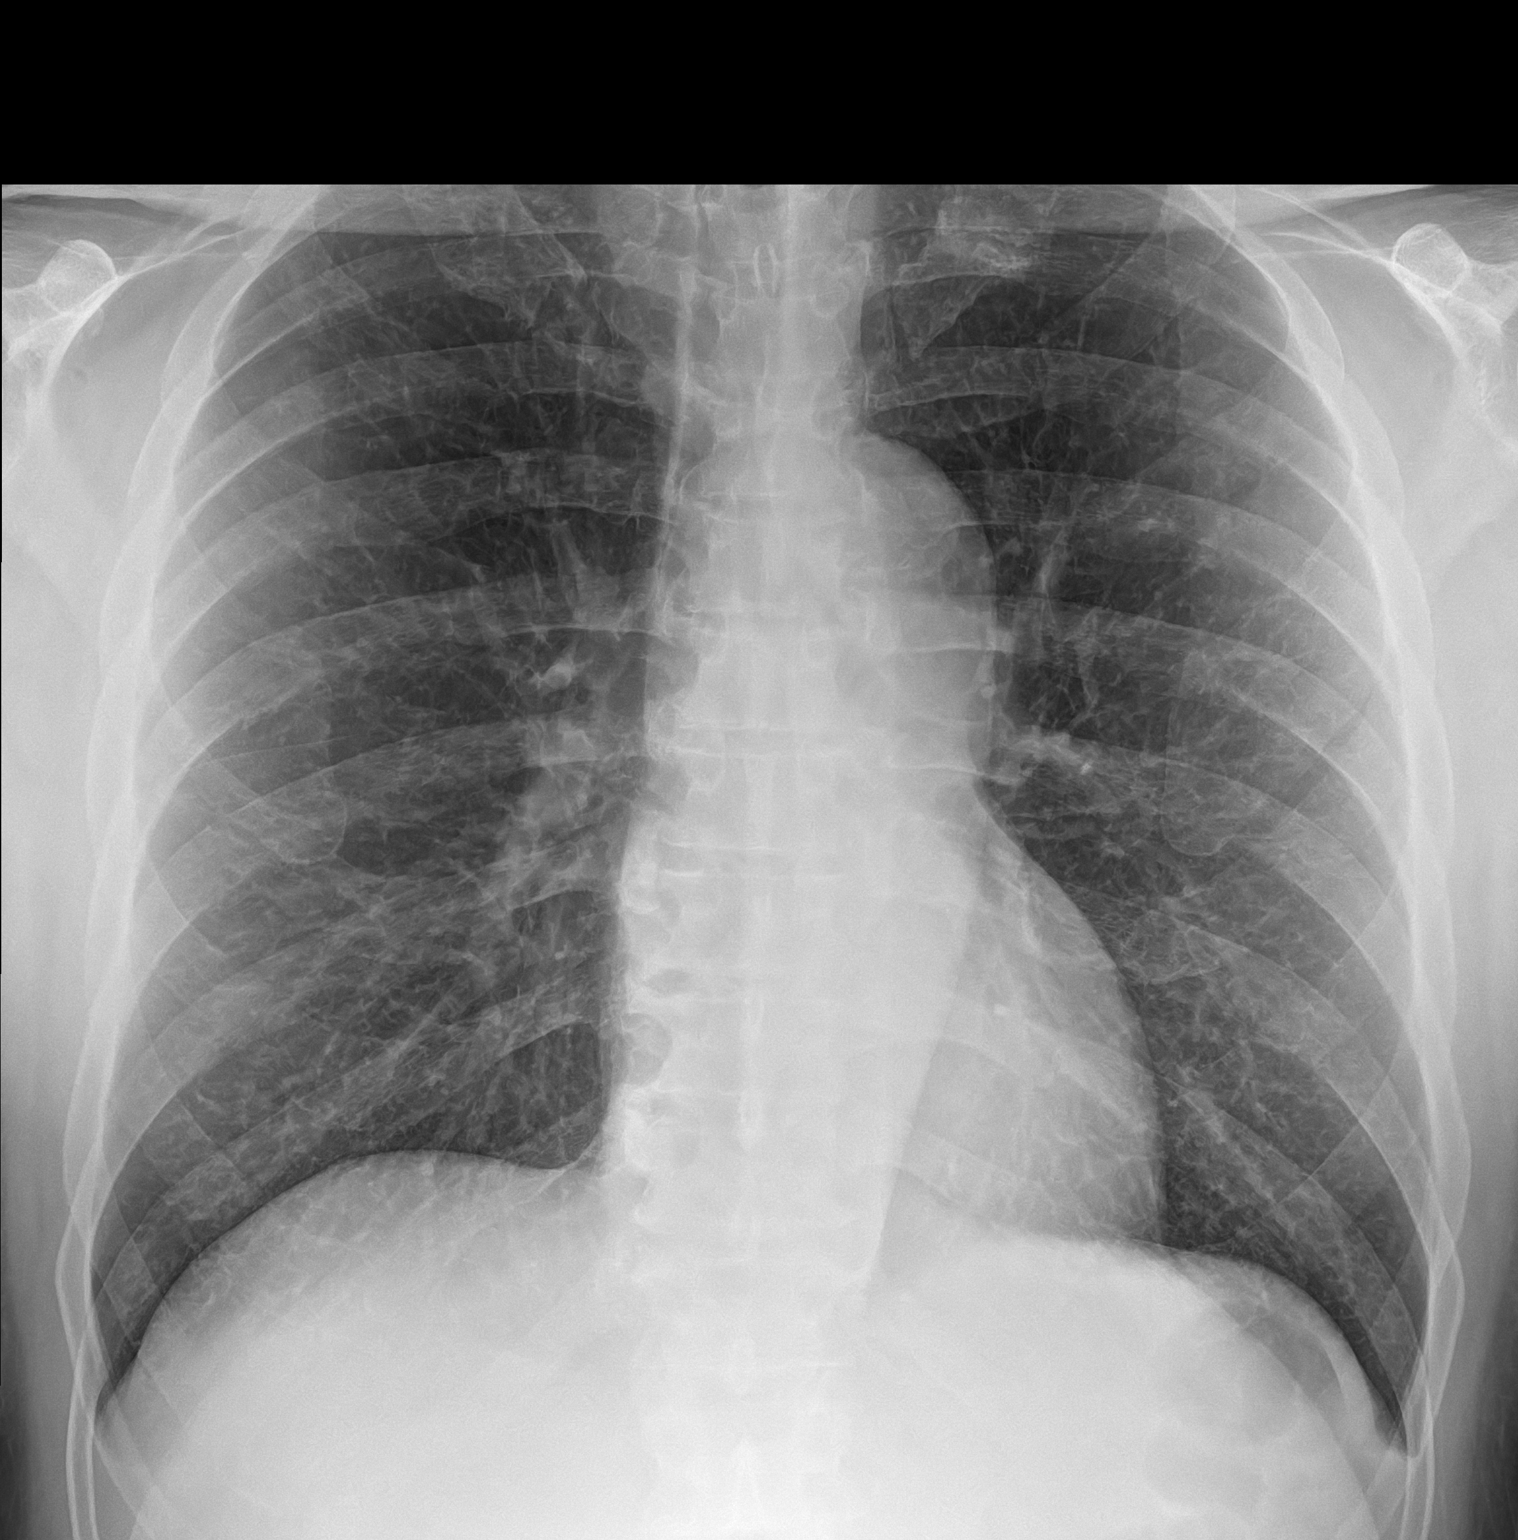

[1 of 1 positions shown; findings below may reference images not displayed]

FINDINGS: The heart size and mediastinal contours are within normal limits.
Both lungs are clear. The visualized skeletal structures are
unremarkable.
IMPRESSION: No active disease.

## 2022-02-03 ENCOUNTER — Encounter (HOSPITAL_COMMUNITY): Payer: Medicare Other

## 2022-02-03 NOTE — Addendum Note (Signed)
Addendum  created 02/03/22 0738 by Effie Berkshire, MD   Intraprocedure Staff edited

## 2022-02-03 NOTE — Addendum Note (Signed)
Encounter addended by: Angelica Ran on: 02/03/2022 8:46 AM  Actions taken: Flowsheet data copied forward, Flowsheet accepted

## 2022-02-05 ENCOUNTER — Encounter (HOSPITAL_COMMUNITY): Payer: Medicare Other

## 2022-02-05 NOTE — Addendum Note (Signed)
Encounter addended by: Dwana Melena, RN on: 02/05/2022 7:36 AM  Actions taken: Clinical Note Signed

## 2022-02-05 NOTE — Progress Notes (Signed)
Cardiac Individual Treatment Plan  Patient Details  Name: HRIDAY STAI MRN: [id number] Date of Birth: 03-Apr-1951 Referring Provider:   Flowsheet Row CARDIAC REHAB PHASE II ORIENTATION from 11/26/2021 in [facility]  Referring Provider Dr. Aundra Dubin       Initial Encounter Date:  Flowsheet Row CARDIAC REHAB PHASE II ORIENTATION from 11/26/2021 in [facility]  Date 11/26/21       Visit Diagnosis: Status post coronary artery stent placement  ST elevation myocardial infarction (STEMI), unspecified artery ([place])  Patient's Home Medications on Admission:  Current Outpatient Medications:    aspirin EC 81 MG tablet, Take 1 tablet (81 mg total) by mouth daily. Swallow whole., Disp: 90 tablet, Rfl: 3   atorvastatin (LIPITOR) 80 MG tablet, Take 1 tablet (80 mg total) by mouth daily., Disp: 90 tablet, Rfl: 3   empagliflozin (JARDIANCE) 25 MG TABS tablet, Take 25 mg by mouth daily., Disp: , Rfl:    furosemide (LASIX) 20 MG tablet, Take 1 tablet (20 mg total) by mouth daily as needed for fluid or edema. (Patient not taking: Reported on 01/10/2022), Disp: 30 tablet, Rfl: 2   HYDROcodone-acetaminophen (NORCO/VICODIN) 5-325 MG tablet, Take 1 tablet by mouth every 6 (six) hours as needed for up to 12 doses for severe pain., Disp: 12 tablet, Rfl: 0   insulin glargine (LANTUS) 100 unit/mL SOPN, Inject 40 Units into the skin at bedtime., Disp: , Rfl:    insulin NPH-insulin regular (NOVOLIN 70/30) (70-30) 100 UNIT/ML injection, Inject 10-25 Units into the skin daily as needed (CBG >180)., Disp: , Rfl:    metoprolol succinate (TOPROL-XL) 50 MG 24 hr tablet, Take 1 tablet (50 mg total) by mouth daily., Disp: 90 tablet, Rfl: 3   nitroGLYCERIN (NITROSTAT) 0.4 MG SL tablet, Place 1 tablet (0.4 mg total) under the tongue every 5 (five) minutes as needed., Disp: 25 tablet, Rfl: 12   RYBELSUS 7 MG TABS, Take 7 mg by mouth daily., Disp: , Rfl:    sacubitril-valsartan  (ENTRESTO) 49-51 MG, Take 1 tablet by mouth 2 (two) times daily., Disp: 180 tablet, Rfl: 3   spironolactone (ALDACTONE) 25 MG tablet, Take 1 tablet (25 mg total) by mouth daily., Disp: 90 tablet, Rfl: 3   ticagrelor (BRILINTA) 90 MG TABS tablet, Take 1 tablet (90 mg total) by mouth 2 (two) times daily., Disp: 180 tablet, Rfl: 3  Past Medical History: Past Medical History:  Diagnosis Date   CAD (coronary artery disease)    Chronic kidney disease, stage 3a (HCC)    Diabetes ([place])    x 20 yrs   Diabetes mellitus    Heart disease    Hepatitis C    HFrEF (heart failure with reduced ejection fraction) (HCC)    Ischemic cardiomyopathy    Mitral regurgitation    Tobacco abuse     Tobacco Use: Social History   Tobacco Use  Smoking Status Former   Packs/day: 0.50   Years: 20.00   Total pack years: 10.00   Types: Cigarettes   Quit date: 08/04/2020   Years since quitting: 1.5  Smokeless Tobacco Never  Tobacco Comments   Quit upon admission to hospital.    Labs: Review Flowsheet       Latest Ref Rng & Units 08/05/2021 08/06/2021 09/17/2021  Labs for ITP Cardiac and Pulmonary Rehab  Cholestrol 0 - 200 mg/dL - 180  147   LDL (calc) 0 - 99 mg/dL - 110  75   Direct LDL 0 -  99 mg/dL - - 65.4   HDL-C >40 mg/dL - 51  56   Trlycerides <150 mg/dL - 96  79   Hemoglobin A1c 4.8 - 5.6 % 7.8  - -    Capillary Blood Glucose: Lab Results  Component Value Date   GLUCAP 109 (H) 01/21/2022   GLUCAP 117 (H) 01/20/2022   GLUCAP 87 01/20/2022   GLUCAP 80 01/20/2022   GLUCAP 99 01/20/2022    POCT Glucose     Row Name 11/26/21 0917             POCT Blood Glucose   Pre-Exercise 138 mg/dL                Exercise Target Goals: Exercise Program Goal: Individual exercise prescription set using results from initial 6 min walk test and THRR while considering  patient's activity barriers and safety.   Exercise Prescription Goal: Starting with aerobic activity 30 plus minutes a day, 3  days per week for initial exercise prescription. Provide home exercise prescription and guidelines that participant acknowledges understanding prior to discharge.  Activity Barriers & Risk Stratification:  Activity Barriers & Cardiac Risk Stratification - 11/26/21 0850       Activity Barriers & Cardiac Risk Stratification   Activity Barriers Decreased Ventricular Function;Other (comment)    Comments bunion on right foot    Cardiac Risk Stratification High             6 Minute Walk:  6 Minute Walk     Row Name 11/26/21 1013         6 Minute Walk   Phase Initial     Distance 1500 feet     Walk Time 6 minutes     # of Rest Breaks 0     MPH 2.84     METS 3.32     RPE 11     VO2 Peak 11.63     Symptoms No     Resting HR 81 bpm     Resting BP 100/68     Resting Oxygen Saturation  100 %     Exercise Oxygen Saturation  during 6 min walk 98 %     Max Ex. HR 93 bpm     Max Ex. BP 116/70     2 Minute Post BP 100/66              Oxygen Initial Assessment:   Oxygen Re-Evaluation:   Oxygen Discharge (Final Oxygen Re-Evaluation):   Initial Exercise Prescription:  Initial Exercise Prescription - 11/26/21 1000       Date of Initial Exercise RX and Referring Provider   Date 11/26/21    Referring Provider Dr. Aundra Dubin    Expected Discharge Date 02/21/22      Treadmill   MPH 1.5    Grade 0    Minutes 17      Recumbant Elliptical   Level 1    RPM 40    Minutes 22      Prescription Details   Frequency (times per week) 3    Duration Progress to 30 minutes of continuous aerobic without signs/symptoms of physical distress      Intensity   THRR 40-80% of Max Heartrate 60-121    Ratings of Perceived Exertion 11-13      Resistance Training   Training Prescription Yes    Weight 3    Reps 10-15             Perform Capillary Blood  Glucose checks as needed.  Exercise Prescription Changes:   Exercise Prescription Changes     Row Name 12/09/21 1000  12/23/21 1200 12/23/21 1500 01/06/22 1000       Response to Exercise   Blood Pressure (Admit) 112/60 -- 128/70 104/64    Blood Pressure (Exercise) 112/64 -- 90/60 110/58    Blood Pressure (Exit) 100/62 -- 90/90 108/58    Heart Rate (Admit) 80 bpm -- 94 bpm 90 bpm    Heart Rate (Exercise) 118 bpm -- 136 bpm 113 bpm    Heart Rate (Exit) 89 bpm -- 103 bpm 96 bpm    Rating of Perceived Exertion (Exercise) 11 -- 12 12    Duration Continue with 30 min of aerobic exercise without signs/symptoms of physical distress. -- Continue with 30 min of aerobic exercise without signs/symptoms of physical distress. Continue with 30 min of aerobic exercise without signs/symptoms of physical distress.    Intensity THRR unchanged -- THRR unchanged THRR unchanged      Progression   Progression Continue to progress workloads to maintain intensity without signs/symptoms of physical distress. -- Continue to progress workloads to maintain intensity without signs/symptoms of physical distress. Continue to progress workloads to maintain intensity without signs/symptoms of physical distress.      Resistance Training   Training Prescription Yes -- Yes Yes    Weight 4 -- 4 4    Reps 10-15 -- 10-15 10-15    Time 10 Minutes -- 10 Minutes 10 Minutes      Treadmill   MPH 1.5 -- 2.4 2.3    Grade 0 -- 1 1.3    Minutes 17 -- 17 17    METs 2.15 -- 3.17 3.08      Recumbant Elliptical   Level 1 -- 4 4    RPM 64 -- 65 62    Minutes 22 -- 22 22    METs 4.3 -- 4.3 4.7      Home Exercise Plan   Plans to continue exercise at -- Home (comment) -- --    Frequency -- Add 2 additional days to program exercise sessions. -- --    Initial Home Exercises Provided -- 12/23/21 -- --             Exercise Comments:   Exercise Comments     Row Name 12/23/21 1253           Exercise Comments home exercise reviewed                Exercise Goals and Review:   Exercise Goals     Row Name 11/26/21 1016 12/09/21  1033 01/06/22 1036 02/03/22 0843       Exercise Goals   Increase Physical Activity Yes Yes Yes Yes    Intervention Provide advice, education, support and counseling about physical activity/exercise needs.;Develop an individualized exercise prescription for aerobic and resistive training based on initial evaluation findings, risk stratification, comorbidities and participant's personal goals. Provide advice, education, support and counseling about physical activity/exercise needs.;Develop an individualized exercise prescription for aerobic and resistive training based on initial evaluation findings, risk stratification, comorbidities and participant's personal goals. Provide advice, education, support and counseling about physical activity/exercise needs.;Develop an individualized exercise prescription for aerobic and resistive training based on initial evaluation findings, risk stratification, comorbidities and participant's personal goals. Provide advice, education, support and counseling about physical activity/exercise needs.;Develop an individualized exercise prescription for aerobic and resistive training based on initial evaluation findings, risk stratification, comorbidities and participant's  personal goals.    Expected Outcomes Short Term: Attend rehab on a regular basis to increase amount of physical activity.;Long Term: Add in home exercise to make exercise part of routine and to increase amount of physical activity.;Long Term: Exercising regularly at least 3-5 days a week. Short Term: Attend rehab on a regular basis to increase amount of physical activity.;Long Term: Add in home exercise to make exercise part of routine and to increase amount of physical activity.;Long Term: Exercising regularly at least 3-5 days a week. Short Term: Attend rehab on a regular basis to increase amount of physical activity.;Long Term: Add in home exercise to make exercise part of routine and to increase amount of  physical activity.;Long Term: Exercising regularly at least 3-5 days a week. Short Term: Attend rehab on a regular basis to increase amount of physical activity.;Long Term: Add in home exercise to make exercise part of routine and to increase amount of physical activity.;Long Term: Exercising regularly at least 3-5 days a week.    Increase Strength and Stamina Yes Yes Yes Yes    Intervention Provide advice, education, support and counseling about physical activity/exercise needs.;Develop an individualized exercise prescription for aerobic and resistive training based on initial evaluation findings, risk stratification, comorbidities and participant's personal goals. Provide advice, education, support and counseling about physical activity/exercise needs.;Develop an individualized exercise prescription for aerobic and resistive training based on initial evaluation findings, risk stratification, comorbidities and participant's personal goals. Provide advice, education, support and counseling about physical activity/exercise needs.;Develop an individualized exercise prescription for aerobic and resistive training based on initial evaluation findings, risk stratification, comorbidities and participant's personal goals. Provide advice, education, support and counseling about physical activity/exercise needs.;Develop an individualized exercise prescription for aerobic and resistive training based on initial evaluation findings, risk stratification, comorbidities and participant's personal goals.    Expected Outcomes Short Term: Increase workloads from initial exercise prescription for resistance, speed, and METs.;Short Term: Perform resistance training exercises routinely during rehab and add in resistance training at home;Long Term: Improve cardiorespiratory fitness, muscular endurance and strength as measured by increased METs and functional capacity (6MWT) Short Term: Increase workloads from initial exercise  prescription for resistance, speed, and METs.;Short Term: Perform resistance training exercises routinely during rehab and add in resistance training at home;Long Term: Improve cardiorespiratory fitness, muscular endurance and strength as measured by increased METs and functional capacity (6MWT) Short Term: Increase workloads from initial exercise prescription for resistance, speed, and METs.;Short Term: Perform resistance training exercises routinely during rehab and add in resistance training at home;Long Term: Improve cardiorespiratory fitness, muscular endurance and strength as measured by increased METs and functional capacity (6MWT) Short Term: Increase workloads from initial exercise prescription for resistance, speed, and METs.;Short Term: Perform resistance training exercises routinely during rehab and add in resistance training at home;Long Term: Improve cardiorespiratory fitness, muscular endurance and strength as measured by increased METs and functional capacity (6MWT)    Able to understand and use rate of perceived exertion (RPE) scale Yes Yes Yes Yes    Intervention Provide education and explanation on how to use RPE scale Provide education and explanation on how to use RPE scale Provide education and explanation on how to use RPE scale Provide education and explanation on how to use RPE scale    Expected Outcomes Short Term: Able to use RPE daily in rehab to express subjective intensity level;Long Term:  Able to use RPE to guide intensity level when exercising independently Short Term: Able to use RPE daily in rehab  to express subjective intensity level;Long Term:  Able to use RPE to guide intensity level when exercising independently Short Term: Able to use RPE daily in rehab to express subjective intensity level;Long Term:  Able to use RPE to guide intensity level when exercising independently Short Term: Able to use RPE daily in rehab to express subjective intensity level;Long Term:  Able to  use RPE to guide intensity level when exercising independently    Knowledge and understanding of Target Heart Rate Range (THRR) Yes Yes Yes Yes    Intervention Provide education and explanation of THRR including how the numbers were predicted and where they are located for reference Provide education and explanation of THRR including how the numbers were predicted and where they are located for reference Provide education and explanation of THRR including how the numbers were predicted and where they are located for reference Provide education and explanation of THRR including how the numbers were predicted and where they are located for reference    Expected Outcomes Short Term: Able to state/look up THRR;Long Term: Able to use THRR to govern intensity when exercising independently;Short Term: Able to use daily as guideline for intensity in rehab Short Term: Able to state/look up THRR;Long Term: Able to use THRR to govern intensity when exercising independently;Short Term: Able to use daily as guideline for intensity in rehab Short Term: Able to state/look up THRR;Long Term: Able to use THRR to govern intensity when exercising independently;Short Term: Able to use daily as guideline for intensity in rehab Short Term: Able to state/look up THRR;Long Term: Able to use THRR to govern intensity when exercising independently;Short Term: Able to use daily as guideline for intensity in rehab    Able to check pulse independently Yes Yes Yes Yes    Intervention Provide education and demonstration on how to check pulse in carotid and radial arteries.;Review the importance of being able to check your own pulse for safety during independent exercise Provide education and demonstration on how to check pulse in carotid and radial arteries.;Review the importance of being able to check your own pulse for safety during independent exercise Provide education and demonstration on how to check pulse in carotid and radial  arteries.;Review the importance of being able to check your own pulse for safety during independent exercise Provide education and demonstration on how to check pulse in carotid and radial arteries.;Review the importance of being able to check your own pulse for safety during independent exercise    Expected Outcomes Short Term: Able to explain why pulse checking is important during independent exercise;Long Term: Able to check pulse independently and accurately Short Term: Able to explain why pulse checking is important during independent exercise;Long Term: Able to check pulse independently and accurately Short Term: Able to explain why pulse checking is important during independent exercise;Long Term: Able to check pulse independently and accurately Short Term: Able to explain why pulse checking is important during independent exercise;Long Term: Able to check pulse independently and accurately    Understanding of Exercise Prescription Yes Yes Yes Yes    Intervention Provide education, explanation, and written materials on patient's individual exercise prescription Provide education, explanation, and written materials on patient's individual exercise prescription Provide education, explanation, and written materials on patient's individual exercise prescription Provide education, explanation, and written materials on patient's individual exercise prescription    Expected Outcomes Short Term: Able to explain program exercise prescription;Long Term: Able to explain home exercise prescription to exercise independently Short Term: Able to explain program  exercise prescription;Long Term: Able to explain home exercise prescription to exercise independently Short Term: Able to explain program exercise prescription;Long Term: Able to explain home exercise prescription to exercise independently Short Term: Able to explain program exercise prescription;Long Term: Able to explain home exercise prescription to exercise  independently             Exercise Goals Re-Evaluation :  Exercise Goals Re-Evaluation     Row Name 12/09/21 1034 01/06/22 1037 02/03/22 0843         Exercise Goal Re-Evaluation   Exercise Goals Review Increase Physical Activity;Increase Strength and Stamina;Able to understand and use rate of perceived exertion (RPE) scale;Knowledge and understanding of Target Heart Rate Range (THRR);Able to check pulse independently;Understanding of Exercise Prescription Increase Physical Activity;Increase Strength and Stamina;Able to understand and use rate of perceived exertion (RPE) scale;Knowledge and understanding of Target Heart Rate Range (THRR);Able to check pulse independently;Understanding of Exercise Prescription Increase Physical Activity;Increase Strength and Stamina;Able to understand and use rate of perceived exertion (RPE) scale;Knowledge and understanding of Target Heart Rate Range (THRR);Able to check pulse independently;Understanding of Exercise Prescription     Comments Pt has completed 5 sessions of cardiac rehab. He is motivated during class and eagar to improve his everyday activities. He is currently exercsising at 4.3 METs on the ellp. Will continue to montior and progress as able. Pt has completed 14 sessions of cardiac rehab. He is very motivated during class and pushes himself to increase his workload each time. He has started walking outside of class on the days off. He is currently exercising at 4.7 METs on the ellp. Will continue to monitor and progress as able. Pt has completed 14 sessions of cardiac rehab. He has not attended class since 01/06/2022. He recenly had a pacemaker inplated. Will update progress when pt returns     Expected Outcomes Through exercise at home and at rehab, the patient will meet their stated goals. Through exercise at home and at rehab, the patient will meet their stated goals. Through exercise at home and at rehab, the patient will meet their stated goals.                Discharge Exercise Prescription (Final Exercise Prescription Changes):  Exercise Prescription Changes - 01/06/22 1000       Response to Exercise   Blood Pressure (Admit) 104/64    Blood Pressure (Exercise) 110/58    Blood Pressure (Exit) 108/58    Heart Rate (Admit) 90 bpm    Heart Rate (Exercise) 113 bpm    Heart Rate (Exit) 96 bpm    Rating of Perceived Exertion (Exercise) 12    Duration Continue with 30 min of aerobic exercise without signs/symptoms of physical distress.    Intensity THRR unchanged      Progression   Progression Continue to progress workloads to maintain intensity without signs/symptoms of physical distress.      Resistance Training   Training Prescription Yes    Weight 4    Reps 10-15    Time 10 Minutes      Treadmill   MPH 2.3    Grade 1.3    Minutes 17    METs 3.08      Recumbant Elliptical   Level 4    RPM 62    Minutes 22    METs 4.7             Nutrition:  Target Goals: Understanding of nutrition guidelines, daily intake of sodium '1500mg'$ , cholesterol '200mg'$ ,  calories 30% from fat and 7% or less from saturated fats, daily to have 5 or more servings of fruits and vegetables.  Biometrics:  Pre Biometrics - 11/26/21 1016       Pre Biometrics   Height '5\' 11"'$  (1.803 m)    Weight 83.1 kg    Waist Circumference 38.5 inches    Hip Circumference 38 inches    Waist to Hip Ratio 1.01 %    BMI (Calculated) 25.56    Triceps Skinfold 10 mm    % Body Fat 24.2 %    Grip Strength 46.5 kg    Flexibility 12 in    Single Leg Stand 11 seconds              Nutrition Therapy Plan and Nutrition Goals:  Nutrition Therapy & Goals - 11/26/21 0823       Intervention Plan   Intervention Nutrition handout(s) given to patient.    Expected Outcomes Short Term Goal: Understand basic principles of dietary content, such as calories, fat, sodium, cholesterol and nutrients.             Nutrition Assessments:  Nutrition  Assessments - 11/26/21 0823       MEDFICTS Scores   Pre Score 36            MEDIFICTS Score Key: ?70 Need to make dietary changes  40-70 Heart Healthy Diet ? 40 Therapeutic Level Cholesterol Diet   Picture Your Plate Scores: <71 Unhealthy dietary pattern with much room for improvement. 41-50 Dietary pattern unlikely to meet recommendations for good health and room for improvement. 51-60 More healthful dietary pattern, with some room for improvement.  >60 Healthy dietary pattern, although there may be some specific behaviors that could be improved.    Nutrition Goals Re-Evaluation:   Nutrition Goals Discharge (Final Nutrition Goals Re-Evaluation):   Psychosocial: Target Goals: Acknowledge presence or absence of significant depression and/or stress, maximize coping skills, provide positive support system. Participant is able to verbalize types and ability to use techniques and skills needed for reducing stress and depression.  Initial Review & Psychosocial Screening:  Initial Psych Review & Screening - 11/26/21 0855       Initial Review   Current issues with None Identified      Family Dynamics   Good Support System? Yes    Comments His support system includes his two daughters.      Barriers   Psychosocial barriers to participate in program There are no identifiable barriers or psychosocial needs.      Screening Interventions   Interventions Encouraged to exercise    Expected Outcomes Long Term goal: The participant improves quality of Life and PHQ9 Scores as seen by post scores and/or verbalization of changes;Short Term goal: Identification and review with participant of any Quality of Life or Depression concerns found by scoring the questionnaire.             Quality of Life Scores:  Quality of Life - 11/26/21 1017       Quality of Life   Select Quality of Life      Quality of Life Scores   Health/Function Pre 26.17 %    Socioeconomic Pre 26.13 %     Psych/Spiritual Pre 23.31 %    Family Pre 30 %    GLOBAL Pre 26 %            Scores of 19 and below usually indicate a poorer quality of life in these areas.  A difference of  2-3 points is a clinically meaningful difference.  A difference of 2-3 points in the total score of the Quality of Life Index has been associated with significant improvement in overall quality of life, self-image, physical symptoms, and general health in studies assessing change in quality of life.  PHQ-9: Review Flowsheet       11/26/2021  Depression screen PHQ 2/9  Decreased Interest 1  Down, Depressed, Hopeless 0  PHQ - 2 Score 1  Altered sleeping 0  Tired, decreased energy 0  Change in appetite 0  Feeling bad or failure about yourself  0  Trouble concentrating 0  Moving slowly or fidgety/restless 0  Suicidal thoughts 0  PHQ-9 Score 1  Difficult doing work/chores Not difficult at all   Interpretation of Total Score  Total Score Depression Severity:  1-4 = Minimal depression, 5-9 = Mild depression, 10-14 = Moderate depression, 15-19 = Moderately severe depression, 20-27 = Severe depression   Psychosocial Evaluation and Intervention:  Psychosocial Evaluation - 11/26/21 0918       Psychosocial Evaluation & Interventions   Interventions Encouraged to exercise with the program and follow exercise prescription    Comments Pt has no barriers to participating in CR. He has no identifiable psychosocial issues. He scored a 1 on his PHQ-9 and he relates this to his decreased energy levels since his STEMI. He reports that he has a good support system with his two daughter. He quit smoking 08/03/2021 when he had his STEMI. He would only smoke when he would drink alcohol, and states that he would smoke no more than a quarter of a pack per day. He has also quit drinking, and he states that he has had no problem giving up alcohol or smoking. His goals while in the program are to decrease his SOB with exertion, and to  improve his strength and stamina. He is eager to begin the program. He is interested in setting up an advanced directive, and I have provided the chaplain with his information, so that they can schedule a meeting.    Expected Outcomes Pt will continue to have no identfiable psychosocial issues.    Continue Psychosocial Services  No Follow up required             Psychosocial Re-Evaluation:  Psychosocial Re-Evaluation     [place] Name 12/02/21 0903 12/30/21 0732           Psychosocial Re-Evaluation   Current issues with None Identified None Identified      Comments Patient is new to the program starting today 5/15. He continues to have no psychosocial barriers identified. We will continue to monitor his progress in the program. Patient has completed 11 sessions. He continues to have no psychosocial barriers identified. He is doing well in the program enjoying the sessions. We will continue to monitor his progress in the program.      Expected Outcomes Patient will continue to have no psychosocial barriers identified. Patient will continue to have no psychosocial barriers identified.      Interventions Stress management education;Relaxation education;Encouraged to attend Cardiac Rehabilitation for the exercise Stress management education;Relaxation education;Encouraged to attend Cardiac Rehabilitation for the exercise      Continue Psychosocial Services  No Follow up required No Follow up required               Psychosocial Discharge (Final Psychosocial Re-Evaluation):  Psychosocial Re-Evaluation - 12/30/21 0732       Psychosocial Re-Evaluation  Current issues with None Identified    Comments Patient has completed 11 sessions. He continues to have no psychosocial barriers identified. He is doing well in the program enjoying the sessions. We will continue to monitor his progress in the program.    Expected Outcomes Patient will continue to have no psychosocial barriers identified.     Interventions Stress management education;Relaxation education;Encouraged to attend Cardiac Rehabilitation for the exercise    Continue Psychosocial Services  No Follow up required             Vocational Rehabilitation: Provide vocational rehab assistance to qualifying candidates.   Vocational Rehab Evaluation & Intervention:  Vocational Rehab - 11/26/21 0859       Initial Vocational Rehab Evaluation & Intervention   Assessment shows need for Vocational Rehabilitation No      Vocational Rehab Re-Evaulation   Comments He is retired.             Education: Education Goals: Education classes will be provided on a weekly basis, covering required topics. Participant will state understanding/return demonstration of topics presented.  Learning Barriers/Preferences:  Learning Barriers/Preferences - 11/26/21 0856       Learning Barriers/Preferences   Learning Barriers None    Learning Preferences Audio;Verbal Instruction             Education Topics: Hypertension, Hypertension Reduction -Define heart disease and high blood pressure. Discus how high blood pressure affects the body and ways to reduce high blood pressure.   Exercise and Your Heart -Discuss why it is important to exercise, the FITT principles of exercise, normal and abnormal responses to exercise, and how to exercise safely.   Angina -Discuss definition of angina, causes of angina, treatment of angina, and how to decrease risk of having angina.   Cardiac Medications -Review what the following cardiac medications are used for, how they affect the body, and side effects that may occur when taking the medications.  Medications include Aspirin, Beta blockers, calcium channel blockers, ACE Inhibitors, angiotensin receptor blockers, diuretics, digoxin, and antihyperlipidemics.   Congestive Heart Failure -Discuss the definition of CHF, how to live with CHF, the signs and symptoms of CHF, and how keep track  of weight and sodium intake.   Heart Disease and Intimacy -Discus the effect sexual activity has on the heart, how changes occur during intimacy as we age, and safety during sexual activity.   Smoking Cessation / COPD -Discuss different methods to quit smoking, the health benefits of quitting smoking, and the definition of COPD. Flowsheet Row CARDIAC REHAB PHASE II EXERCISE from 01/01/2022 in [place]  Date 12/04/21  Educator pb  Instruction Review Code 1- Verbalizes Understanding       Nutrition I: Fats -Discuss the types of cholesterol, what cholesterol does to the heart, and how cholesterol levels can be controlled. Flowsheet Row CARDIAC REHAB PHASE II EXERCISE from 01/01/2022 in [place]  Date 12/11/21  Educator pb  Instruction Review Code 1- Verbalizes Understanding       Nutrition II: Labels -Discuss the different components of food labels and how to read food label [place] from 01/01/2022 in [place]  Date 12/25/21  Educator DF  Instruction Review Code 1- Verbalizes Understanding       Heart Parts/Heart Disease and PAD -Discuss the anatomy of the heart, the pathway of blood circulation through the heart, and these are affected by heart disease. [facility] REHAB PHASE  II EXERCISE from 01/01/2022 in [place]  Date 01/01/22  Educator DF  Instruction Review Code 2- Demonstrated Understanding       Stress I: Signs and Symptoms -Discuss the causes of stress, how stress may lead to anxiety and depression, and ways to limit stress.   Stress II: Relaxation -Discuss different types of relaxation techniques to limit stress.   Warning Signs of Stroke / TIA -Discuss definition of a stroke, what the signs and symptoms are of a stroke, and how to identify when someone is having stroke.   Knowledge Questionnaire Score:   Knowledge Questionnaire Score - 11/26/21 0857       Knowledge Questionnaire Score   Pre Score 26/28             Core Components/Risk Factors/Patient Goals at Admission:  Personal Goals and Risk Factors at Admission - 11/26/21 0901       Core Components/Risk Factors/Patient Goals on Admission   Improve shortness of breath with ADL's Yes    Intervention Provide education, individualized exercise plan and daily activity instruction to help decrease symptoms of SOB with activities of daily living.    Expected Outcomes Short Term: Improve cardiorespiratory fitness to achieve a reduction of symptoms when performing ADLs;Long Term: Be able to perform more ADLs without symptoms or delay the onset of symptoms    Diabetes Yes    Intervention Provide education about signs/symptoms and action to take for hypo/hyperglycemia.;Provide education about proper nutrition, including hydration, and aerobic/resistive exercise prescription along with prescribed medications to achieve blood glucose in normal ranges: Fasting glucose 65-99 mg/dL    Expected Outcomes Short Term: Participant verbalizes understanding of the signs/symptoms and immediate care of hyper/hypoglycemia, proper foot care and importance of medication, aerobic/resistive exercise and nutrition plan for blood glucose control.;Long Term: Attainment of HbA1C < 7%.    Heart Failure Yes    Intervention Provide a combined exercise and nutrition program that is supplemented with education, support and counseling about heart failure. Directed toward relieving symptoms such as shortness of breath, decreased exercise tolerance, and extremity edema.    Expected Outcomes Improve functional capacity of life;Short term: Attendance in program 2-3 days a week with increased exercise capacity. Reported lower sodium intake. Reported increased fruit and vegetable intake. Reports medication compliance.;Short term: Daily weights obtained and reported for increase.  Utilizing diuretic protocols set by physician.;Long term: Adoption of self-care skills and reduction of barriers for early signs and symptoms recognition and intervention leading to self-care maintenance.    Personal Goal Other Yes    Personal Goal improve strength and stamina    Intervention Attend CR three times per week and begin a home exercise program of 2-4 days per week    Expected Outcomes Pt will report gains in strength and stamina, and he will improve on his walk test distance.             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 He has multiple risk factors for CAD and is participating in the program for risk modification. He started the program today 5/15. He last A1C was 7.8%. His DM is controlled with insulin and jardiance. His personal goals for the  program are to decrease his SOB with activity and improve his strength and staminal. We will continue to monitor his progress as he works towards meeting these goals. Patient has completed 11 sesisons. His current weight is 183 lbs gaining 1 lb since the last 30 day review. His last A1C was 08/05/21 at 7.8%. He is doing well in the program with consistent attendance and progressions. His blood pressue is well controlled and soft at times. 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 It was decided to move forward with ICD implant which is scheduled 01/20/22. His personal goals for the program continue to be to decrease his SOB with activity and improve his strength and stamina. We will continue to monitor his progress as he works towards meeting these goals. Patient has not attended the  program in this 30 day review period. He had an ICD placed 7/3 and presented to the ED 7/4 for post operative pain and drainage from site. We will continue to monitor if he will be able to return to the program. He has completed 14 sessions.     Expected Outcomes Patient will complete the program meeting both personal and program goals. Patient will complete the program meeting both personal and program goals. Patient will complete the program meeting both personal and program goals.              Core Components/Risk Factors/Patient Goals at Discharge (Final Review):   Goals and Risk Factor Review - 01/27/22 0739       Core Components/Risk Factors/Patient Goals Review   Personal Goals Review Diabetes;Heart Failure;Other    Review Patient has not attended the program in this 30 day review period. He had an ICD placed 7/3 and presented to the ED 7/4 for post operative pain and drainage from site. We will continue to monitor if he will be able to return to the program. He has completed 14 sessions.    Expected Outcomes Patient will complete the program meeting both personal and program goals.             ITP Comments:   Comments: ITP REVIEW Patient currently not attending. He has completed 14 sessions.

## 2022-02-07 ENCOUNTER — Encounter (HOSPITAL_COMMUNITY): Payer: Medicare Other

## 2022-02-10 ENCOUNTER — Encounter (HOSPITAL_COMMUNITY): Payer: Medicare Other

## 2022-02-12 ENCOUNTER — Encounter (HOSPITAL_COMMUNITY): Payer: Medicare Other

## 2022-02-12 NOTE — Addendum Note (Signed)
Encounter addended by: Philis Kendall on: 02/12/2022 1:06 PM  Actions taken: Clinical Note Signed

## 2022-02-12 NOTE — Progress Notes (Signed)
Discharge Progress Report  Patient Details  Name: Jose Clark MRN: [id number] Date of Birth: 1950-08-27 Referring Provider:   Flowsheet Row CARDIAC REHAB PHASE II ORIENTATION from 11/26/2021 in   Referring Provider Dr. Aundra Dubin        Number of Visits: 14  Reason for Discharge:  Early Exit:  Lack of attendance  Smoking History:  Social History   Tobacco Use  Smoking Status Former   Packs/day: 0.50   Years: 20.00   Total pack years: 10.00   Types: Cigarettes   Quit date: 08/04/2020   Years since quitting: 1.5  Smokeless Tobacco Never  Tobacco Comments   Quit upon admission to hospital.    Diagnosis:  Status post coronary artery stent placement  ST elevation myocardial infarction (STEMI), unspecified artery ([place])  ADL [facility]:   Initial Exercise Prescription:  Initial Exercise Prescription - 11/26/21 1000       Date of Initial Exercise RX and Referring Provider   Date 11/26/21    Referring Provider Dr. Aundra Dubin    Expected Discharge Date 02/21/22      Treadmill   MPH 1.5    Grade 0    Minutes 17      Recumbant Elliptical   Level 1    RPM 40    Minutes 22      Prescription Details   Frequency (times per week) 3    Duration Progress to 30 minutes of continuous aerobic without signs/symptoms of physical distress      Intensity   THRR 40-80% of Max Heartrate 60-121    Ratings of Perceived Exertion 11-13      Resistance Training   Training Prescription Yes    Weight 3    Reps 10-15             Discharge Exercise Prescription (Final Exercise Prescription Changes):  Exercise Prescription Changes - 01/06/22 1000       Response to Exercise   Blood Pressure (Admit) 104/64    Blood Pressure (Exercise) 110/58    Blood Pressure (Exit) 108/58    Heart Rate (Admit) 90 bpm    Heart Rate (Exercise) 113 bpm    Heart Rate (Exit) 96 bpm    Rating of Perceived Exertion (Exercise) 12    Duration Continue with 30 min of  aerobic exercise without signs/symptoms of physical distress.    Intensity THRR unchanged      Progression   Progression Continue to progress workloads to maintain intensity without signs/symptoms of physical distress.      Resistance Training   Training Prescription Yes    Weight 4    Reps 10-15    Time 10 Minutes      Treadmill   MPH 2.3    Grade 1.3    Minutes 17    METs 3.08      Recumbant Elliptical   Level 4    RPM 62    Minutes 22    METs 4.7             Functional Capacity:  6 Minute Walk     Row Name 11/26/21 1013         6 Minute Walk   Phase Initial     Distance 1500 feet     Walk Time 6 minutes     # of Rest Breaks 0     MPH 2.84     METS 3.32     RPE 11     VO2  Peak 11.63     Symptoms No     Resting HR 81 bpm     Resting BP 100/68     Resting Oxygen Saturation  100 %     Exercise Oxygen Saturation  during 6 min walk 98 %     Max Ex. HR 93 bpm     Max Ex. BP 116/70     2 Minute Post BP 100/66              Psychological, QOL, Others - Outcomes: PHQ 2/9:    11/26/2021    8:51 AM  Depression screen PHQ 2/9  Decreased Interest 1  Down, Depressed, Hopeless 0  PHQ - 2 Score 1  Altered sleeping 0  Tired, decreased energy 0  Change in appetite 0  Feeling bad or failure about yourself  0  Trouble concentrating 0  Moving slowly or fidgety/restless 0  Suicidal thoughts 0  PHQ-9 Score 1  Difficult doing work/chores Not difficult at all    Quality of Life:  Quality of Life - 11/26/21 1017       Quality of Life   Select Quality of Life      Quality of Life Scores   Health/Function Pre 26.17 %    Socioeconomic Pre 26.13 %    Psych/Spiritual Pre 23.31 %    Family Pre 30 %    GLOBAL Pre 26 %             Personal Goals: Goals established at orientation with interventions provided to work toward goal.  Personal Goals and Risk Factors at Admission - 11/26/21 0901       Core Components/Risk Factors/Patient Goals on  Admission   Improve shortness of breath with ADL's Yes    Intervention Provide education, individualized exercise plan and daily activity instruction to help decrease symptoms of SOB with activities of daily living.    Expected Outcomes Short Term: Improve cardiorespiratory fitness to achieve a reduction of symptoms when performing ADLs;Long Term: Be able to perform more ADLs without symptoms or delay the onset of symptoms    Diabetes Yes    Intervention Provide education about signs/symptoms and action to take for hypo/hyperglycemia.;Provide education about proper nutrition, including hydration, and aerobic/resistive exercise prescription along with prescribed medications to achieve blood glucose in normal ranges: Fasting glucose 65-99 mg/dL    Expected Outcomes Short Term: Participant verbalizes understanding of the signs/symptoms and immediate care of hyper/hypoglycemia, proper foot care and importance of medication, aerobic/resistive exercise and nutrition plan for blood glucose control.;Long Term: Attainment of HbA1C < 7%.    Heart Failure Yes    Intervention Provide a combined exercise and nutrition program that is supplemented with education, support and counseling about heart failure. Directed toward relieving symptoms such as shortness of breath, decreased exercise tolerance, and extremity edema.    Expected Outcomes Improve functional capacity of life;Short term: Attendance in program 2-3 days a week with increased exercise capacity. Reported lower sodium intake. Reported increased fruit and vegetable intake. Reports medication compliance.;Short term: Daily weights obtained and reported for increase. Utilizing diuretic protocols set by physician.;Long term: Adoption of self-care skills and reduction of barriers for early signs and symptoms recognition and intervention leading to self-care maintenance.    Personal Goal Other Yes    Personal Goal improve strength and stamina    Intervention  Attend CR three times per week and begin a home exercise program of 2-4 days per week    Expected Outcomes Pt will  report gains in strength and stamina, and he will improve on his walk test distance.              Personal Goals Discharge:  Goals and Risk Factor Review     Row Name 12/02/21 0906 12/30/21 0735 01/27/22 0739         Core Components/Risk Factors/Patient Goals Review   Personal Goals Review Diabetes;Heart Failure;Other Diabetes;Heart Failure;Other Diabetes;Heart Failure;Other     Review Patient was referred to CR with STEMI/DES. He has multiple risk factors for CAD and is participating in the program for risk modification. He started the program today 5/15. He last A1C was 7.8%. His DM is controlled with insulin and jardiance. His personal goals for the program are to decrease his SOB with activity and improve his strength and staminal. We will continue to monitor his progress as he works towards meeting these goals. Patient has completed 11 sesisons. His current weight is 183 lbs gaining 1 lb since the last 30 day review. His last A1C was 08/05/21 at 7.8%. He is doing well in the program with consistent attendance and progressions. His blood pressue is well controlled and soft at times. 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 It was decided to move forward with ICD implant which is scheduled 01/20/22. His personal goals for the program continue to be to decrease his SOB with activity and improve his strength and stamina. We will continue to monitor his progress as he works towards meeting these goals. Patient has not attended the program in this 30 day review period. He had an ICD placed 7/3 and presented to the ED 7/4 for post operative pain and drainage from site. We will continue to monitor if he will be able to return to the program. He has completed 14 sessions.     Expected Outcomes Patient will complete the program meeting both personal  and program goals. Patient will complete the program meeting both personal and program goals. Patient will complete the program meeting both personal and program goals.              Exercise Goals and Review:  Exercise Goals     Row Name 11/26/21 1016 12/09/21 1033 01/06/22 1036 02/03/22 0843       Exercise Goals   Increase Physical Activity Yes Yes Yes Yes    Intervention Provide advice, education, support and counseling about physical activity/exercise needs.;Develop an individualized exercise prescription for aerobic and resistive training based on initial evaluation findings, risk stratification, comorbidities and participant's personal goals. Provide advice, education, support and counseling about physical activity/exercise needs.;Develop an individualized exercise prescription for aerobic and resistive training based on initial evaluation findings, risk stratification, comorbidities and participant's personal goals. Provide advice, education, support and counseling about physical activity/exercise needs.;Develop an individualized exercise prescription for aerobic and resistive training based on initial evaluation findings, risk stratification, comorbidities and participant's personal goals. Provide advice, education, support and counseling about physical activity/exercise needs.;Develop an individualized exercise prescription for aerobic and resistive training based on initial evaluation findings, risk stratification, comorbidities and participant's personal goals.    Expected Outcomes Short Term: Attend rehab on a regular basis to increase amount of physical activity.;Long Term: Add in home exercise to make exercise part of routine and to increase amount of physical activity.;Long Term: Exercising regularly at least 3-5 days a week. Short Term: Attend rehab on a regular basis to increase amount of physical activity.;Long Term: Add in home exercise to  make exercise part of routine and to  increase amount of physical activity.;Long Term: Exercising regularly at least 3-5 days a week. Short Term: Attend rehab on a regular basis to increase amount of physical activity.;Long Term: Add in home exercise to make exercise part of routine and to increase amount of physical activity.;Long Term: Exercising regularly at least 3-5 days a week. Short Term: Attend rehab on a regular basis to increase amount of physical activity.;Long Term: Add in home exercise to make exercise part of routine and to increase amount of physical activity.;Long Term: Exercising regularly at least 3-5 days a week.    Increase Strength and Stamina Yes Yes Yes Yes    Intervention Provide advice, education, support and counseling about physical activity/exercise needs.;Develop an individualized exercise prescription for aerobic and resistive training based on initial evaluation findings, risk stratification, comorbidities and participant's personal goals. Provide advice, education, support and counseling about physical activity/exercise needs.;Develop an individualized exercise prescription for aerobic and resistive training based on initial evaluation findings, risk stratification, comorbidities and participant's personal goals. Provide advice, education, support and counseling about physical activity/exercise needs.;Develop an individualized exercise prescription for aerobic and resistive training based on initial evaluation findings, risk stratification, comorbidities and participant's personal goals. Provide advice, education, support and counseling about physical activity/exercise needs.;Develop an individualized exercise prescription for aerobic and resistive training based on initial evaluation findings, risk stratification, comorbidities and participant's personal goals.    Expected Outcomes Short Term: Increase workloads from initial exercise prescription for resistance, speed, and METs.;Short Term: Perform resistance training  exercises routinely during rehab and add in resistance training at home;Long Term: Improve cardiorespiratory fitness, muscular endurance and strength as measured by increased METs and functional capacity (6MWT) Short Term: Increase workloads from initial exercise prescription for resistance, speed, and METs.;Short Term: Perform resistance training exercises routinely during rehab and add in resistance training at home;Long Term: Improve cardiorespiratory fitness, muscular endurance and strength as measured by increased METs and functional capacity (6MWT) Short Term: Increase workloads from initial exercise prescription for resistance, speed, and METs.;Short Term: Perform resistance training exercises routinely during rehab and add in resistance training at home;Long Term: Improve cardiorespiratory fitness, muscular endurance and strength as measured by increased METs and functional capacity (6MWT) Short Term: Increase workloads from initial exercise prescription for resistance, speed, and METs.;Short Term: Perform resistance training exercises routinely during rehab and add in resistance training at home;Long Term: Improve cardiorespiratory fitness, muscular endurance and strength as measured by increased METs and functional capacity (6MWT)    Able to understand and use rate of perceived exertion (RPE) scale Yes Yes Yes Yes    Intervention Provide education and explanation on how to use RPE scale Provide education and explanation on how to use RPE scale Provide education and explanation on how to use RPE scale Provide education and explanation on how to use RPE scale    Expected Outcomes Short Term: Able to use RPE daily in rehab to express subjective intensity level;Long Term:  Able to use RPE to guide intensity level when exercising independently Short Term: Able to use RPE daily in rehab to express subjective intensity level;Long Term:  Able to use RPE to guide intensity level when exercising independently  Short Term: Able to use RPE daily in rehab to express subjective intensity level;Long Term:  Able to use RPE to guide intensity level when exercising independently Short Term: Able to use RPE daily in rehab to express subjective intensity level;Long Term:  Able to use RPE to guide  intensity level when exercising independently    Knowledge and understanding of Target Heart Rate Range (THRR) Yes Yes Yes Yes    Intervention Provide education and explanation of THRR including how the numbers were predicted and where they are located for reference Provide education and explanation of THRR including how the numbers were predicted and where they are located for reference Provide education and explanation of THRR including how the numbers were predicted and where they are located for reference Provide education and explanation of THRR including how the numbers were predicted and where they are located for reference    Expected Outcomes Short Term: Able to state/look up THRR;Long Term: Able to use THRR to govern intensity when exercising independently;Short Term: Able to use daily as guideline for intensity in rehab Short Term: Able to state/look up THRR;Long Term: Able to use THRR to govern intensity when exercising independently;Short Term: Able to use daily as guideline for intensity in rehab Short Term: Able to state/look up THRR;Long Term: Able to use THRR to govern intensity when exercising independently;Short Term: Able to use daily as guideline for intensity in rehab Short Term: Able to state/look up THRR;Long Term: Able to use THRR to govern intensity when exercising independently;Short Term: Able to use daily as guideline for intensity in rehab    Able to check pulse independently Yes Yes Yes Yes    Intervention Provide education and demonstration on how to check pulse in carotid and radial arteries.;Review the importance of being able to check your own pulse for safety during independent exercise Provide  education and demonstration on how to check pulse in carotid and radial arteries.;Review the importance of being able to check your own pulse for safety during independent exercise Provide education and demonstration on how to check pulse in carotid and radial arteries.;Review the importance of being able to check your own pulse for safety during independent exercise Provide education and demonstration on how to check pulse in carotid and radial arteries.;Review the importance of being able to check your own pulse for safety during independent exercise    Expected Outcomes Short Term: Able to explain why pulse checking is important during independent exercise;Long Term: Able to check pulse independently and accurately Short Term: Able to explain why pulse checking is important during independent exercise;Long Term: Able to check pulse independently and accurately Short Term: Able to explain why pulse checking is important during independent exercise;Long Term: Able to check pulse independently and accurately Short Term: Able to explain why pulse checking is important during independent exercise;Long Term: Able to check pulse independently and accurately    Understanding of Exercise Prescription Yes Yes Yes Yes    Intervention Provide education, explanation, and written materials on patient's individual exercise prescription Provide education, explanation, and written materials on patient's individual exercise prescription Provide education, explanation, and written materials on patient's individual exercise prescription Provide education, explanation, and written materials on patient's individual exercise prescription    Expected Outcomes Short Term: Able to explain program exercise prescription;Long Term: Able to explain home exercise prescription to exercise independently Short Term: Able to explain program exercise prescription;Long Term: Able to explain home exercise prescription to exercise independently  Short Term: Able to explain program exercise prescription;Long Term: Able to explain home exercise prescription to exercise independently Short Term: Able to explain program exercise prescription;Long Term: Able to explain home exercise prescription to exercise independently             Exercise Goals Re-Evaluation:  Exercise Goals Re-Evaluation  [place] Name 12/09/21 1034 01/06/22 1037 02/03/22 0843         Exercise Goal Re-Evaluation   Exercise Goals Review Increase Physical Activity;Increase Strength and Stamina;Able to understand and use rate of perceived exertion (RPE) scale;Knowledge and understanding of Target Heart Rate Range (THRR);Able to check pulse independently;Understanding of Exercise Prescription Increase Physical Activity;Increase Strength and Stamina;Able to understand and use rate of perceived exertion (RPE) scale;Knowledge and understanding of Target Heart Rate Range (THRR);Able to check pulse independently;Understanding of Exercise Prescription Increase Physical Activity;Increase Strength and Stamina;Able to understand and use rate of perceived exertion (RPE) scale;Knowledge and understanding of Target Heart Rate Range (THRR);Able to check pulse independently;Understanding of Exercise Prescription     Comments Pt has completed 5 sessions of cardiac rehab. He is motivated during class and eagar to improve his everyday activities. He is currently exercsising at 4.3 METs on the ellp. Will continue to montior and progress as able. Pt has completed 14 sessions of cardiac rehab. He is very motivated during class and pushes himself to increase his workload each time. He has started walking outside of class on the days off. He is currently exercising at 4.7 METs on the ellp. Will continue to monitor and progress as able. Pt has completed 14 sessions of cardiac rehab. He has not attended class since 01/06/2022. He recenly had a pacemaker inplated. Will update progress when pt returns      Expected Outcomes Through exercise at home and at rehab, the patient will meet their stated goals. Through exercise at home and at rehab, the patient will meet their stated goals. Through exercise at home and at rehab, the patient will meet their stated goals.              Nutrition & Weight - Outcomes:  Pre Biometrics - 11/26/21 1016       Pre Biometrics   Height '5\' 11"'$  (1.803 m)    Weight 183 lb 3.2 oz (83.1 kg)    Waist Circumference 38.5 inches    Hip Circumference 38 inches    Waist to Hip Ratio 1.01 %    BMI (Calculated) 25.56    Triceps Skinfold 10 mm    % Body Fat 24.2 %    Grip Strength 46.5 kg    Flexibility 12 in    Single Leg Stand 11 seconds              Nutrition:  Nutrition Therapy & Goals - 11/26/21 0823       Intervention Plan   Intervention Nutrition handout(s) given to patient.    Expected Outcomes Short Term Goal: Understand basic principles of dietary content, such as calories, fat, sodium, cholesterol and nutrients.             Nutrition Discharge:  Nutrition Assessments - 11/26/21 0823       MEDFICTS Scores   Pre Score 36             Education Questionnaire Score:  Knowledge Questionnaire Score - 11/26/21 0857       Knowledge Questionnaire Score   Pre Score 26/28             Pt discharged from CR after 14 sessions on 02/12/2022. He last attended rehab on 01/06/2022. He has been called several times by staff to see if he is coming back to rehab, but we have been unable to reach him, and he has a VM that is full.

## 2022-02-14 ENCOUNTER — Ambulatory Visit: Payer: Medicare Other | Admitting: Cardiology

## 2022-02-14 ENCOUNTER — Encounter (HOSPITAL_COMMUNITY): Payer: Medicare Other

## 2022-02-17 ENCOUNTER — Encounter (HOSPITAL_COMMUNITY): Payer: Medicare Other

## 2022-02-19 ENCOUNTER — Encounter (HOSPITAL_COMMUNITY): Payer: Medicare Other

## 2022-02-21 ENCOUNTER — Encounter (HOSPITAL_COMMUNITY): Payer: Medicare Other

## 2022-03-14 ENCOUNTER — Ambulatory Visit (HOSPITAL_COMMUNITY)
Admission: RE | Admit: 2022-03-14 | Discharge: 2022-03-14 | Disposition: A | Discharge status: Home / Self Care | Payer: Medicare Other | Source: Ambulatory Visit | Attending: Family Medicine | Admitting: Family Medicine

## 2022-03-14 ENCOUNTER — Encounter (HOSPITAL_COMMUNITY): Payer: Self-pay

## 2022-03-14 VITALS — BP 90/64 | HR 82 | Wt 179.0 lb

## 2022-03-14 DIAGNOSIS — Z7984 Long term (current) use of oral hypoglycemic drugs: Secondary | ICD-10-CM | POA: Insufficient documentation

## 2022-03-14 DIAGNOSIS — Z794 Long term (current) use of insulin: Secondary | ICD-10-CM | POA: Insufficient documentation

## 2022-03-14 DIAGNOSIS — Z720 Tobacco use: Secondary | ICD-10-CM | POA: Diagnosis not present

## 2022-03-14 DIAGNOSIS — I2510 Atherosclerotic heart disease of native coronary artery without angina pectoris: Secondary | ICD-10-CM | POA: Diagnosis not present

## 2022-03-14 DIAGNOSIS — I252 Old myocardial infarction: Secondary | ICD-10-CM | POA: Insufficient documentation

## 2022-03-14 DIAGNOSIS — Z7902 Long term (current) use of antithrombotics/antiplatelets: Secondary | ICD-10-CM | POA: Insufficient documentation

## 2022-03-14 DIAGNOSIS — Z79899 Other long term (current) drug therapy: Secondary | ICD-10-CM | POA: Insufficient documentation

## 2022-03-14 DIAGNOSIS — N1831 Chronic kidney disease, stage 3a: Secondary | ICD-10-CM | POA: Diagnosis not present

## 2022-03-14 DIAGNOSIS — I5022 Chronic systolic (congestive) heart failure: Secondary | ICD-10-CM | POA: Diagnosis not present

## 2022-03-14 DIAGNOSIS — I255 Ischemic cardiomyopathy: Secondary | ICD-10-CM | POA: Diagnosis not present

## 2022-03-14 DIAGNOSIS — E785 Hyperlipidemia, unspecified: Secondary | ICD-10-CM | POA: Insufficient documentation

## 2022-03-14 DIAGNOSIS — Z87891 Personal history of nicotine dependence: Secondary | ICD-10-CM | POA: Insufficient documentation

## 2022-03-14 DIAGNOSIS — N183 Chronic kidney disease, stage 3 unspecified: Secondary | ICD-10-CM | POA: Diagnosis not present

## 2022-03-14 DIAGNOSIS — E1122 Type 2 diabetes mellitus with diabetic chronic kidney disease: Secondary | ICD-10-CM

## 2022-03-14 LAB — BRAIN NATRIURETIC PEPTIDE: B Natriuretic Peptide: 81.9 pg/mL (ref 0.0–100.0)

## 2022-03-14 LAB — BASIC METABOLIC PANEL
Anion gap: 6 (ref 5–15)
BUN: 16 mg/dL (ref 8–23)
CO2: 20 mmol/L — ABNORMAL LOW (ref 22–32)
Calcium: 8.5 mg/dL — ABNORMAL LOW (ref 8.9–10.3)
Chloride: 111 mmol/L (ref 98–111)
Creatinine, Ser: 1.27 mg/dL — ABNORMAL HIGH (ref 0.61–1.24)
GFR, Estimated: >60 mL/min (ref >60)
Glucose, Bld: 132 mg/dL — ABNORMAL HIGH (ref 70–99)
Potassium: 3.9 mmol/L (ref 3.5–5.1)
Sodium: 137 mmol/L (ref 135–145)

## 2022-03-14 NOTE — Patient Instructions (Signed)
Thank you for coming in today  Labs were done today, if any labs are abnormal the clinic will call you No news is good news  Your physician recommends that you schedule a follow-up appointment in:  3-4 months with Dr. Aundra Dubin    Do the following things EVERYDAY: Weigh yourself in the morning before breakfast. Write it down and keep it in a log. Take your medicines as prescribed Eat low salt foods--Limit salt (sodium) to 2000 mg per day.  Stay as active as you can everyday Limit all fluids for the day to less than 2 liters  At the [facility], you and your health needs are our priority. As part of our continuing mission to provide you with exceptional heart care, we have created designated Provider Care Teams. These Care Teams include your primary Cardiologist (physician) and Advanced Practice Providers (APPs- Physician Assistants and Nurse Practitioners) who all work together to provide you with the care you need, when you need it.   You may see any of the following providers on your designated Care Team at your next follow up: Dr Glori Bickers Dr Haynes Kerns, NP Lyda Jester, [place] [facility] [place], [place] Audry Riles, PharmD   Please be sure to bring in all your medications bottles to every appointment.   If you have any questions or concerns before your next appointment please send [place] a message through [place] or call our office at [phone].    TO LEAVE A MESSAGE FOR THE NURSE SELECT OPTION 2, PLEASE LEAVE A MESSAGE INCLUDING: YOUR NAME DATE OF BIRTH CALL BACK NUMBER REASON FOR CALL**this is important as we prioritize the call backs  YOU WILL RECEIVE A CALL BACK THE SAME DAY AS LONG AS YOU CALL BEFORE 4:00 PM

## 2022-03-14 NOTE — Progress Notes (Signed)
Primary Care: Dr. Nevada Crane Primary Cardiologist: Dr. Gwenlyn Found  HF Cardiology: Dr. Aundra Dubin  HPI: Patient was referred to HF clinic from [facility] clinic for evaluation of CHF.   Jose Clark is a 71 y.o. with a history of ischemic cardiomyopathy, diabetes, CAD, tobacco abuse, and CKD Stage IIIa.  Presented to [facility] in 1/23 with chest pain + shortness of breath and found to have anterior STEMI. Echo this admission showed EF 30-35%, moderate Jose. Transferred to [facility] for cath. Underwent cath with multivessel disease and required DES to 99% proximal LAD stenosis; RCA was occluded with L>R collaterals. Started on GDMT with Toprol XL, jardiance, and losartan.    Echo 4/23, EF 30-35% with mildly decreased RV function, IVC normal.   S/p sQ ICD 7/23.  Today he returns for HF follow up. Overall feeling fine. He works fulltime in [employer] and has no dyspnea with work activities. Denies palpitations, abnormal bleeding, CP, dizziness, edema, or PND/Orthopnea. Appetite ok. No fever or chills. He does not weigh at home. Taking all medications. Quit tobacco 1/23, no ETOH. He stopped CR at [facility].  Device interrogation (personally reviewed): No VT, no AF  Labs (2/23): LDL 75, HDl 56, K 4.7, creatinine 1.19 Labs (6/23): K 5.1, creatinine 1.28, hgb 15.8   PMH: 1. Chronic systolic CHF: Ischemic cardiomyopathy.  - Echo (1/23): EF 30-35%, normal RV, moderate Jose.  - Echo (4/23): EF 30-35% with mildly decreased RV function, IVC normal.  2. CAD: Anterior STEMI 1/23 with 99% pLAD and CTO mid RCA with L>R collaterals.  - DES to pLAD.  3. CKD stage 3 4. Type 2 diabetes 5. Hyperlipidemia 6. Prior smoker 7. HCV  Review of Systems: All systems reviewed and negative as per HPI.   Current Outpatient Medications  Medication Sig Dispense Refill   aspirin EC 81 MG tablet Take 1 tablet (81 mg total) by mouth daily. Swallow whole. 90 tablet 3   atorvastatin (LIPITOR) 80 MG tablet Take 1 tablet (80 mg total) by mouth daily.  90 tablet 3   empagliflozin (JARDIANCE) 25 MG TABS tablet Take 25 mg by mouth daily.     insulin glargine (LANTUS) 100 unit/mL SOPN Inject 40 Units into the skin at bedtime.     insulin NPH-insulin regular (NOVOLIN 70/30) (70-30) 100 UNIT/ML injection Inject 10-25 Units into the skin daily as needed (CBG >180).     metoprolol succinate (TOPROL-XL) 50 MG 24 hr tablet Take 1 tablet (50 mg total) by mouth daily. 90 tablet 3   nitroGLYCERIN (NITROSTAT) 0.4 MG SL tablet Place 1 tablet (0.4 mg total) under the tongue every 5 (five) minutes as needed. 25 tablet 12   RYBELSUS 7 MG TABS Take 7 mg by mouth daily. As needed     sacubitril-valsartan (ENTRESTO) 49-51 MG Take 1 tablet by mouth 2 (two) times daily. 180 tablet 3   spironolactone (ALDACTONE) 25 MG tablet Take 1 tablet (25 mg total) by mouth daily. 90 tablet 3   ticagrelor (BRILINTA) 90 MG TABS tablet Take 1 tablet (90 mg total) by mouth 2 (two) times daily. 180 tablet 3   No current facility-administered medications for this encounter.   No Known Allergies  Social History   Socioeconomic History   Marital status: Single    Spouse name: Not on file   Number of children: 1   Years of education: Not on file   Highest education level: High school graduate  Occupational History   Occupation: retired  Tobacco Use   Smoking  status: Former    Packs/day: 0.50    Years: 20.00    Total pack years: 10.00    Types: Cigarettes    Quit date: 08/04/2020    Years since quitting: 1.6   Smokeless tobacco: Never   Tobacco comments:    Quit upon admission to hospital.  Vaping Use   Vaping Use: Never used  Substance and Sexual Activity   Alcohol use: Not Currently    Comment: occassionally  does not drink everyday. Occasional beer.   Drug use: Not Currently    Comment: cocaine   Sexual activity: Yes  Other Topics Concern   Not on file  Social History Narrative   Not on file   Social Determinants of Health   Financial Resource Strain: Low  Risk  (08/06/2021)   Overall Financial Resource Strain (CARDIA)    Difficulty of Paying Living Expenses: Not very hard  Food Insecurity: No Food Insecurity (08/06/2021)   Hunger Vital Sign    Worried About Running Out of Food in the Last Year: Never true    Ran Out of Food in the Last Year: Never true  Transportation Needs: No Transportation Needs (08/06/2021)   PRAPARE - Hydrologist (Medical): No    Lack of Transportation (Non-Medical): No  Physical Activity: Not on file  Stress: Not on file  Social Connections: Not on file  Intimate Partner Violence: Not on file   Family History  Problem Relation Age of Onset   Dementia Mother    CVA Father    BP 90/64   Pulse 82   Wt 81.2 kg (179 lb)   SpO2 99%   BMI 24.97 kg/m   Wt Readings from Last 3 Encounters:  03/14/22 81.2 kg (179 lb)  01/21/22 83 kg (183 lb)  01/20/22 83 kg (183 lb)   PHYSICAL EXAM: General:  NAD. No resp difficulty HEENT: Normal Neck: Supple. No JVD. Carotids 2+ bilat; no bruits. No lymphadenopathy or thryomegaly appreciated. Cor: PMI nondisplaced. Regular rate & rhythm. No rubs, gallops or murmurs. Lungs: Clear Abdomen: Soft, nontender, nondistended. No hepatosplenomegaly. No bruits or masses. Good bowel sounds. Extremities: No cyanosis, clubbing, rash, edema Neuro: Alert & oriented x 3, cranial nerves grossly intact. Moves all 4 extremities w/o difficulty. Affect pleasant.   ASSESSMENT & PLAN: 1. Chronic systolic CHF: Ischemic cardiomyopathy.  Echo (1/23) with EF 30-35%, moderate Jose, normal RV.  Echo (4/23) EF 30-35% with mildly decreased RV function, IVC normal. Now, s/p sQ ICD. NYHA class I-II, not volume overloaded on exam.  - Continue Entresto 49/51 bid. BMET/BNP today. - Continue spironolactone 25 mg daily.  - Continue empagliflozin 10 mg daily. - Continue Toprol XL 50 mg daily.  2.  CAD: STEMI in 1/23 with 99% pLAD and CTO RCA, he had DES to LAD. No chest pain.  -  Continue ASA 81 and ticagrelor.  - Continue statin.  - He stopped CR at [facility]. 3. Type 2 diabetes: Per PCP.  4. Tobacco Abuse: Congratulated again on quitting.  5. CKD Stage IIIa: BMET today.   Follow up in 3 months with Dr. Aundra Dubin.   Maricela Bo [facility] FNP-BC 03/14/2022

## 2022-03-28 DIAGNOSIS — E785 Hyperlipidemia, unspecified: Secondary | ICD-10-CM | POA: Diagnosis not present

## 2022-03-28 DIAGNOSIS — E1165 Type 2 diabetes mellitus with hyperglycemia: Secondary | ICD-10-CM | POA: Diagnosis not present

## 2022-03-28 DIAGNOSIS — R809 Proteinuria, unspecified: Secondary | ICD-10-CM | POA: Diagnosis not present

## 2022-04-03 ENCOUNTER — Ambulatory Visit: Payer: Medicare Other | Admitting: Cardiology

## 2022-04-22 DIAGNOSIS — Z95810 Presence of automatic (implantable) cardiac defibrillator: Secondary | ICD-10-CM | POA: Insufficient documentation

## 2022-04-24 ENCOUNTER — Encounter: Payer: Medicare Other | Admitting: Internal Medicine

## 2022-04-24 DIAGNOSIS — I255 Ischemic cardiomyopathy: Secondary | ICD-10-CM

## 2022-04-24 DIAGNOSIS — I5022 Chronic systolic (congestive) heart failure: Secondary | ICD-10-CM

## 2022-04-24 DIAGNOSIS — Z95810 Presence of automatic (implantable) cardiac defibrillator: Secondary | ICD-10-CM

## 2022-04-25 ENCOUNTER — Ambulatory Visit (INDEPENDENT_AMBULATORY_CARE_PROVIDER_SITE_OTHER): Payer: Medicare Other

## 2022-04-25 DIAGNOSIS — I255 Ischemic cardiomyopathy: Secondary | ICD-10-CM | POA: Diagnosis not present

## 2022-04-28 LAB — CUP PACEART REMOTE DEVICE CHECK
Battery Remaining Percentage: 99 %
Date Time Interrogation Session: 20231007101800
Implantable Lead Implant Date: 20230703
Implantable Lead Location: 753862
Implantable Lead Model: 3501
Implantable Lead Serial Number: 235028
Implantable Pulse Generator Implant Date: 20230703
Pulse Gen Serial Number: 174730

## 2022-04-29 NOTE — Progress Notes (Signed)
Remote ICD transmission.   

## 2022-05-15 DIAGNOSIS — E785 Hyperlipidemia, unspecified: Secondary | ICD-10-CM | POA: Diagnosis not present

## 2022-05-15 DIAGNOSIS — E1165 Type 2 diabetes mellitus with hyperglycemia: Secondary | ICD-10-CM | POA: Diagnosis not present

## 2022-05-15 DIAGNOSIS — Z955 Presence of coronary angioplasty implant and graft: Secondary | ICD-10-CM | POA: Diagnosis not present

## 2022-05-15 DIAGNOSIS — I252 Old myocardial infarction: Secondary | ICD-10-CM | POA: Diagnosis not present

## 2022-05-15 DIAGNOSIS — R931 Abnormal findings on diagnostic imaging of heart and coronary circulation: Secondary | ICD-10-CM | POA: Diagnosis not present

## 2022-05-15 DIAGNOSIS — N1831 Chronic kidney disease, stage 3a: Secondary | ICD-10-CM | POA: Diagnosis not present

## 2022-05-15 DIAGNOSIS — I10 Essential (primary) hypertension: Secondary | ICD-10-CM | POA: Diagnosis not present

## 2022-05-15 DIAGNOSIS — R7401 Elevation of levels of liver transaminase levels: Secondary | ICD-10-CM | POA: Diagnosis not present

## 2022-05-15 DIAGNOSIS — R809 Proteinuria, unspecified: Secondary | ICD-10-CM | POA: Diagnosis not present

## 2022-07-02 ENCOUNTER — Encounter (HOSPITAL_COMMUNITY): Payer: Medicare Other | Admitting: Cardiology

## 2022-07-02 ENCOUNTER — Encounter: Payer: Medicare Other | Admitting: Internal Medicine

## 2022-07-03 ENCOUNTER — Encounter: Payer: Self-pay | Admitting: Cardiology

## 2022-07-03 ENCOUNTER — Ambulatory Visit: Payer: Medicare Other | Attending: Cardiology | Admitting: Cardiology

## 2022-07-03 VITALS — BP 122/66 | HR 70 | Ht 71.0 in | Wt 185.0 lb

## 2022-07-03 DIAGNOSIS — I2510 Atherosclerotic heart disease of native coronary artery without angina pectoris: Secondary | ICD-10-CM | POA: Diagnosis not present

## 2022-07-03 DIAGNOSIS — I5022 Chronic systolic (congestive) heart failure: Secondary | ICD-10-CM | POA: Diagnosis not present

## 2022-07-03 MED ORDER — SPIRONOLACTONE 25 MG PO TABS: 25.0000 mg | ORAL_TABLET | Freq: Every day | ORAL | 3 refills | Status: DC

## 2022-07-03 NOTE — Progress Notes (Signed)
Clinical Summary Mr. Krauter is a 71 y.o.male seen today for follow up of the following medical problems.   1.CAD -history anterior STEMI Jan 2023 - DES to prox LAD Echo 4/23, EF 30-35% with mildly decreased RV function, IVC normal.   - no recent chest pains, no SOB/DOE - compliant with meds - Jan 2023 ICD check normal function  2. ICM/chronic HFrEF Echo (1/23): EF 30-35%, normal RV, moderate MR.  - Echo (4/23): EF 30-35% with mildly decreased RV function, IVC normal.  - has AICD  - no SOB/DOE, no LE edema - from HF clinic had been on aldactone '25mg'$  daily, has fallen off current med list. I do not see documentation that it was to be stopped - medical therapy has been limited by soft bp's   3. CKD IIIA   SH: retired Nature conservation officer Past Medical History:  Diagnosis Date   CAD (coronary artery disease)    Chronic kidney disease, stage 3a ([place])    Diabetes ([place])    x 20 yrs   Diabetes mellitus    Heart disease    Hepatitis C    HFrEF (heart failure with reduced ejection fraction) (HCC)    Ischemic cardiomyopathy    Mitral regurgitation    Tobacco abuse      No Known Allergies   Current Outpatient Medications  Medication Sig Dispense Refill   aspirin EC 81 MG tablet Take 1 tablet (81 mg total) by mouth daily. Swallow whole. 90 tablet 3   atorvastatin (LIPITOR) 80 MG tablet Take 1 tablet (80 mg total) by mouth daily. 90 tablet 3   empagliflozin (JARDIANCE) 25 MG TABS tablet Take 25 mg by mouth daily.     insulin glargine (LANTUS) 100 unit/mL SOPN Inject 40 Units into the skin at bedtime.     insulin NPH-insulin regular (NOVOLIN 70/30) (70-30) 100 UNIT/ML injection Inject 10-25 Units into the skin daily as needed (CBG >180).     metoprolol succinate (TOPROL-XL) 50 MG 24 hr tablet Take 1 tablet (50 mg total) by mouth daily. 90 tablet 3   nitroGLYCERIN (NITROSTAT) 0.4 MG SL tablet Place 1 tablet (0.4 mg total) under the tongue every 5 (five) minutes as  needed. 25 tablet 12   RYBELSUS 7 MG TABS Take 7 mg by mouth daily. As needed     sacubitril-valsartan (ENTRESTO) 49-51 MG Take 1 tablet by mouth 2 (two) times daily. 180 tablet 3   spironolactone (ALDACTONE) 25 MG tablet Take 1 tablet (25 mg total) by mouth daily. 90 tablet 3   ticagrelor (BRILINTA) 90 MG TABS tablet Take 1 tablet (90 mg total) by mouth 2 (two) times daily. 180 tablet 3   No current facility-administered medications for this visit.     Past Surgical History:  Procedure Laterality Date   CARDIAC DEFIBRILLATOR PLACEMENT Left 01/20/2022   CIRCUMCISION     30 years ago   COLONOSCOPY N/A 01/24/2016   Procedure: COLONOSCOPY;  Surgeon: Rogene Houston, MD;  [facility];  Service: Endoscopy;  Laterality: N/A;  1130   CORONARY/GRAFT ACUTE MI REVASCULARIZATION N/A 08/03/2021   Procedure: Coronary/Graft Acute MI Revascularization;  Surgeon: Burnell Blanks, MD;  Location: [place] [facility];  Service: Cardiovascular;  Laterality: N/A;   LEFT HEART CATH AND CORONARY ANGIOGRAPHY N/A 08/03/2021   Procedure: LEFT HEART CATH AND CORONARY ANGIOGRAPHY;  Surgeon: Burnell Blanks, MD;  Location: [place] [facility];  Service: Cardiovascular;  Laterality: N/A;   SUBQ  ICD IMPLANT N/A 01/20/2022   Procedure: SUBQ ICD IMPLANT;  Surgeon: Deboraha Sprang, MD;  Location: [place] [facility];  Service: Cardiovascular;  Laterality: N/A;     No Known Allergies    Family History  Problem Relation Age of Onset   Dementia Mother    CVA Father      Social History Mr. Bochicchio reports that he quit smoking about 22 months ago. His smoking use included cigarettes. He has a 10.00 pack-year smoking history. He has never used smokeless tobacco. Mr. Burgard reports that he does not currently use alcohol.   Review of Systems CONSTITUTIONAL: No weight loss, fever, chills, weakness or fatigue.  HEENT: Eyes: No visual loss, blurred vision, double vision or yellow sclerae.No  hearing loss, sneezing, congestion, runny nose or sore throat.  SKIN: No rash or itching.  CARDIOVASCULAR: per hpi RESPIRATORY: No shortness of breath, cough or sputum.  GASTROINTESTINAL: No anorexia, nausea, vomiting or diarrhea. No abdominal pain or blood.  GENITOURINARY: No burning on urination, no polyuria NEUROLOGICAL: No headache, dizziness, syncope, paralysis, ataxia, numbness or tingling in the extremities. No change in bowel or bladder control.  MUSCULOSKELETAL: No muscle, back pain, joint pain or stiffness.  LYMPHATICS: No enlarged nodes. No history of splenectomy.  PSYCHIATRIC: No history of depression or anxiety.  ENDOCRINOLOGIC: No reports of sweating, cold or heat intolerance. No polyuria or polydipsia.  [name] Kitchen   Physical Examination Today's Vitals   07/03/22 0808  BP: 122/66  Pulse: 70  SpO2: 98%  Weight: 185 lb (83.9 kg)  Height: '5\' 11"'$  (1.803 m)   Body mass index is 25.8 kg/m.  Gen: resting comfortably, no acute distress HEENT: no scleral icterus, pupils equal round and reactive, no palptable cervical adenopathy,  CV: RRR, no mr/g, no jvd Resp: Clear to auscultation bilaterally GI: abdomen is soft, non-tender, non-distended, normal bowel sounds, no hepatosplenomegaly MSK: extremities are warm, no edema.  Skin: warm, no rash Neuro:  no focal deficits Psych: appropriate affect   Diagnostic Studies     Assessment and Plan  1.CAD - no recent symptoms - continue medical therapy, can d/c brillinta 08/03/22  2. Chronic HFrEF - follwed in HF clinic - appears aldactone fell off med list,I dont see documentation it was to be stopped. Will restart aldactone '25mg'$  daily - prior issues with soft bp's limited medical therapy, follow bp's back on aldactone. Perhaps further titration HF meds at f/u with [place] with HF clinic   F/u 6 months, continue to follow with HF clinic      Arnoldo Lenis, M.D.

## 2022-07-03 NOTE — Patient Instructions (Signed)
Medication Instructions:  Your physician has recommended you make the following change in your medication:  -Restart Spironolactone 25 mg tablets once daily -On Jan. 14, 2024 you can stop Brilinta.   Labwork: None  Testing/Procedures: None  Follow-Up: Follow up with Dr. Harl Bowie in 6 months. Follow up with Heart Failure Clinic.   Any Other Special Instructions Will Be Listed Below (If Applicable).     If you need a refill on your cardiac medications before your next appointment, please call your pharmacy.

## 2022-07-22 NOTE — Progress Notes (Shared)
[facility] Note  08/05/2022     CHIEF COMPLAINT Patient presents for No chief complaint on file.    HISTORY OF PRESENT ILLNESS: NDREW Clark is a 72 y.o. male who presents to the clinic today for:    Pt has been lost to follow up since November 2022, pt had MI on 01.14.23, pt states he had 1 stent place and his chest pain has since improved   Referring physician: Celene Squibb, MD [place],   [id number]  HISTORICAL INFORMATION:   Selected notes from the MEDICAL RECORD NUMBER Referred by Dr. Kathlen Mody for DME   CURRENT MEDICATIONS: No current outpatient medications on file. (Ophthalmic Drugs)   No current facility-administered medications for this visit. (Ophthalmic Drugs)   Current Outpatient Medications (Other)  Medication Sig   aspirin EC 81 MG tablet Take 1 tablet (81 mg total) by mouth daily. Swallow whole.   atorvastatin (LIPITOR) 80 MG tablet Take 1 tablet (80 mg total) by mouth daily.   empagliflozin (JARDIANCE) 25 MG TABS tablet Take 25 mg by mouth daily.   insulin glargine (LANTUS) 100 unit/mL SOPN Inject 40 Units into the skin at bedtime.   insulin NPH-insulin regular (NOVOLIN 70/30) (70-30) 100 UNIT/ML injection Inject 10-25 Units into the skin daily as needed (CBG >180).   metoprolol succinate (TOPROL-XL) 25 MG 24 hr tablet Take 25 mg by mouth daily.   nitroGLYCERIN (NITROSTAT) 0.4 MG SL tablet Place 1 tablet (0.4 mg total) under the tongue every 5 (five) minutes as needed.   RYBELSUS 7 MG TABS Take 7 mg by mouth daily. As needed   sacubitril-valsartan (ENTRESTO) 49-51 MG Take 1 tablet by mouth 2 (two) times daily.   spironolactone (ALDACTONE) 25 MG tablet Take 1 tablet (25 mg total) by mouth daily.   No current facility-administered medications for this visit. (Other)   REVIEW OF SYSTEMS:   ALLERGIES No Known Allergies  PAST MEDICAL HISTORY Past Medical History:  Diagnosis Date   CAD (coronary artery  disease)    Chronic kidney disease, stage 3a (HCC)    Diabetes ([place])    x 20 yrs   Diabetes mellitus    Heart disease    Hepatitis C    HFrEF (heart failure with reduced ejection fraction) (HCC)    Ischemic cardiomyopathy    Mitral regurgitation    Tobacco abuse    Past Surgical History:  Procedure Laterality Date   CARDIAC DEFIBRILLATOR PLACEMENT Left 01/20/2022   CIRCUMCISION     30 years ago   COLONOSCOPY N/A 01/24/2016   Procedure: COLONOSCOPY;  Surgeon: Rogene Houston, MD;  [facility];  Service: Endoscopy;  Laterality: N/A;  1130   CORONARY/GRAFT ACUTE MI REVASCULARIZATION N/A 08/03/2021   Procedure: Coronary/Graft Acute MI Revascularization;  Surgeon: Burnell Blanks, MD;  Location: [place] [facility];  Service: Cardiovascular;  Laterality: N/A;   LEFT HEART CATH AND CORONARY ANGIOGRAPHY N/A 08/03/2021   Procedure: LEFT HEART CATH AND CORONARY ANGIOGRAPHY;  Surgeon: Burnell Blanks, MD;  Location: [place] [facility];  Service: Cardiovascular;  Laterality: N/A;   SUBQ ICD IMPLANT N/A 01/20/2022   Procedure: SUBQ ICD IMPLANT;  Surgeon: Deboraha Sprang, MD;  Location: [place] [facility];  Service: Cardiovascular;  Laterality: N/A;   FAMILY HISTORY Family History  Problem Relation Age of Onset   Dementia Mother    CVA Father    SOCIAL HISTORY Social History  Tobacco Use   Smoking status: Former    Packs/day: 0.50    Years: 20.00    Total pack years: 10.00    Types: Cigarettes    Quit date: 08/04/2020    Years since quitting: 1.9   Smokeless tobacco: Never   Tobacco comments:    Quit upon admission to hospital.  Vaping Use   Vaping Use: Never used  Substance Use Topics   Alcohol use: Not Currently    Comment: occassionally  does not drink everyday. Occasional beer.   Drug use: Not Currently    Comment: cocaine       OPHTHALMIC EXAM: Not recorded    IMAGING AND PROCEDURES  Imaging and Procedures for 08/05/2022           ASSESSMENT/PLAN:    ICD-10-CM   1. Proliferative diabetic retinopathy of both eyes with macular edema associated with type 2 diabetes mellitus ([place])  C48.8891     2. Essential hypertension  I10     3. Hypertensive retinopathy of both eyes  H35.033     4. Pseudophakia, both eyes  Z96.1       1. Proliferative diabetic retinopathy, both eyes  - pt lost to f/u since Nov 2022 (5 mos) due to health issues and MI in Jan 2023 - s/p IVA OD #1 (09.14.22), #2 (10.18.22) - s/p PRP OS 11.04.22 (incomiplete / segmental only) - pt unable to tolerate laser with topical anesthesia -- **will need subconj block for PRP** - exam shows scattered MA, DBH, early NVE OU - FA (09.14.22) shows +focal NVE OU, late leaking MA's OU and vascular perfusion defect OU - OCT shows persistent IRF/DME OD  - recommend holding injection today due to MI in January - pt in agreement - f/u 6-8 weeks -- DFE/OCT/FA (transit OD), possible injection  2,3. Hypertensive retinopathy OU - discussed importance of tight BP control - monitor   4. Pseudophakia OU  - s/p CE/IOL  - IOL in good position, doing well  - monitor   Ophthalmic Meds Ordered this visit:  No orders of the defined types were placed in this encounter.    No follow-ups on file.  There are no Patient Instructions on file for this visit.   Explained the diagnoses, plan, and follow up with the patient and they expressed understanding.  Patient expressed understanding of the importance of proper follow up care.   This document serves as a record of services personally performed by Gardiner Sleeper, MD, PhD. It was created on their behalf by Renaldo Reel, [employer] an ophthalmic technician. The creation of this record is the provider's dictation and/or activities during the visit.    Electronically signed by:  Renaldo Reel, COT 01.02.24 8:56 AM    Gardiner Sleeper, M.D., Ph.D. Diseases & Surgery of the Retina and Vitreous [facility]  [place]: M myopia (nearsighted); A astigmatism; H hyperopia (farsighted); P presbyopia; Mrx spectacle prescription;  CTL contact lenses; OD right eye; OS left eye; OU both eyes  XT exotropia; ET esotropia; PEK punctate epithelial keratitis; PEE punctate epithelial erosions; DES dry eye syndrome; MGD meibomian gland dysfunction; ATs artificial tears; PFAT's preservative free artificial tears; [place] nuclear sclerotic cataract; PSC posterior subcapsular cataract; ERM epi-retinal membrane; PVD posterior vitreous detachment; RD retinal detachment; DM diabetes mellitus; DR diabetic retinopathy; NPDR non-proliferative diabetic retinopathy; PDR proliferative diabetic retinopathy; CSME clinically significant macular edema; DME diabetic macular edema; dbh dot blot hemorrhages; CWS cotton wool spot; POAG  primary open angle glaucoma; C/D cup-to-disc ratio; HVF humphrey visual field; GVF goldmann visual field; OCT optical coherence tomography; IOP intraocular pressure; BRVO Branch retinal vein occlusion; CRVO central retinal vein occlusion; CRAO central retinal artery occlusion; BRAO branch retinal artery occlusion; RT retinal tear; SB scleral buckle; PPV pars plana vitrectomy; VH Vitreous hemorrhage; PRP panretinal laser photocoagulation; IVK intravitreal kenalog; VMT vitreomacular traction; MH Macular hole;  NVD neovascularization of the disc; NVE neovascularization elsewhere; AREDS age related eye disease study; ARMD age related macular degeneration; POAG primary open angle glaucoma; EBMD epithelial/anterior basement membrane dystrophy; ACIOL anterior chamber intraocular lens; IOL intraocular lens; PCIOL posterior chamber intraocular lens; Phaco/IOL phacoemulsification with intraocular lens placement; [place] photorefractive keratectomy; LASIK laser assisted in situ keratomileusis; HTN hypertension; DM diabetes mellitus; COPD chronic obstructive pulmonary disease

## 2022-07-25 ENCOUNTER — Ambulatory Visit (INDEPENDENT_AMBULATORY_CARE_PROVIDER_SITE_OTHER): Payer: Medicare Other

## 2022-07-25 DIAGNOSIS — I255 Ischemic cardiomyopathy: Secondary | ICD-10-CM | POA: Diagnosis not present

## 2022-07-29 LAB — CUP PACEART REMOTE DEVICE CHECK
Battery Remaining Percentage: 96 %
Date Time Interrogation Session: 20240108112100
Implantable Lead Connection Status: 753985
Implantable Lead Implant Date: 20230703
Implantable Lead Location: 753862
Implantable Lead Model: 3501
Implantable Lead Serial Number: 235028
Implantable Pulse Generator Implant Date: 20230703
Pulse Gen Serial Number: 174730

## 2022-08-05 ENCOUNTER — Encounter (INDEPENDENT_AMBULATORY_CARE_PROVIDER_SITE_OTHER): Payer: 59 | Admitting: Ophthalmology

## 2022-08-05 DIAGNOSIS — I10 Essential (primary) hypertension: Secondary | ICD-10-CM

## 2022-08-05 DIAGNOSIS — Z961 Presence of intraocular lens: Secondary | ICD-10-CM

## 2022-08-05 DIAGNOSIS — E113513 Type 2 diabetes mellitus with proliferative diabetic retinopathy with macular edema, bilateral: Secondary | ICD-10-CM

## 2022-08-05 DIAGNOSIS — H35033 Hypertensive retinopathy, bilateral: Secondary | ICD-10-CM

## 2022-08-12 NOTE — Progress Notes (Signed)
Remote ICD transmission.   

## 2022-08-12 NOTE — Progress Notes (Shared)
[facility] Note  08/26/2022     CHIEF COMPLAINT Patient presents for No chief complaint on file.    HISTORY OF PRESENT ILLNESS: Jose Clark is a 72 y.o. male who presents to the clinic today for:    Pt has been lost to follow up since November 2022, pt had MI on 01.14.23, pt states he had 1 stent place and his chest pain has since improved   Referring physician: Celene Squibb, MD [place],  [place] [address]  HISTORICAL INFORMATION:   Selected notes from the MEDICAL RECORD NUMBER Referred by Dr. Kathlen Mody for DME   CURRENT MEDICATIONS: No current outpatient medications on file. (Ophthalmic Drugs)   No current facility-administered medications for this visit. (Ophthalmic Drugs)   Current Outpatient Medications (Other)  Medication Sig   atorvastatin (LIPITOR) 80 MG tablet Take 1 tablet (80 mg total) by mouth daily.   empagliflozin (JARDIANCE) 25 MG TABS tablet Take 25 mg by mouth daily.   insulin glargine (LANTUS) 100 unit/mL SOPN Inject 40 Units into the skin at bedtime.   insulin NPH-insulin regular (NOVOLIN 70/30) (70-30) 100 UNIT/ML injection Inject 10-25 Units into the skin daily as needed (CBG >180).   metoprolol succinate (TOPROL-XL) 25 MG 24 hr tablet Take 25 mg by mouth daily.   nitroGLYCERIN (NITROSTAT) 0.4 MG SL tablet Place 1 tablet (0.4 mg total) under the tongue every 5 (five) minutes as needed.   RYBELSUS 7 MG TABS Take 7 mg by mouth daily. As needed   sacubitril-valsartan (ENTRESTO) 49-51 MG Take 1 tablet by mouth 2 (two) times daily.   spironolactone (ALDACTONE) 25 MG tablet Take 1 tablet (25 mg total) by mouth daily.   No current facility-administered medications for this visit. (Other)   REVIEW OF SYSTEMS:   ALLERGIES No Known Allergies  PAST MEDICAL HISTORY Past Medical History:  Diagnosis Date   CAD (coronary artery disease)    Chronic kidney disease, stage 3a (HCC)    Diabetes ([place])    x 20 yrs    Diabetes mellitus    Heart disease    Hepatitis C    HFrEF (heart failure with reduced ejection fraction) (HCC)    Ischemic cardiomyopathy    Mitral regurgitation    Tobacco abuse    Past Surgical History:  Procedure Laterality Date   CARDIAC DEFIBRILLATOR PLACEMENT Left 01/20/2022   CIRCUMCISION     30 years ago   COLONOSCOPY N/A 01/24/2016   Procedure: COLONOSCOPY;  Surgeon: Rogene Houston, MD;  [facility];  Service: Endoscopy;  Laterality: N/A;  1130   CORONARY/GRAFT ACUTE MI REVASCULARIZATION N/A 08/03/2021   Procedure: Coronary/Graft Acute MI Revascularization;  Surgeon: Burnell Blanks, MD;  Location: [facility];  Service: Cardiovascular;  Laterality: N/A;   LEFT HEART CATH AND CORONARY ANGIOGRAPHY N/A 08/03/2021   Procedure: LEFT HEART CATH AND CORONARY ANGIOGRAPHY;  Surgeon: Burnell Blanks, MD;  Location: [facility];  Service: Cardiovascular;  Laterality: N/A;   SUBQ ICD IMPLANT N/A 01/20/2022   Procedure: SUBQ ICD IMPLANT;  Surgeon: Deboraha Sprang, MD;  Location: [facility];  Service: Cardiovascular;  Laterality: N/A;   FAMILY HISTORY Family History  Problem Relation Age of Onset   Dementia Mother    CVA Father    SOCIAL HISTORY Social History   Tobacco Use   Smoking status: Former    Packs/day: 0.50    Years: 20.00  Total pack years: 10.00    Types: Cigarettes    Quit date: 08/04/2020    Years since quitting: 2.0   Smokeless tobacco: Never   Tobacco comments:    Quit upon admission to hospital.  Vaping Use   Vaping Use: Never used  Substance Use Topics   Alcohol use: Not Currently    Comment: occassionally  does not drink everyday. Occasional beer.   Drug use: Not Currently    Comment: cocaine       OPHTHALMIC EXAM: Not recorded    IMAGING AND PROCEDURES  Imaging and Procedures for 08/26/2022          ASSESSMENT/PLAN:    ICD-10-CM   1. Proliferative diabetic retinopathy of both eyes with  macular edema associated with type 2 diabetes mellitus ([place])  V95.6387     2. Essential hypertension  I10     3. Hypertensive retinopathy of both eyes  H35.033     4. Pseudophakia, both eyes  Z96.1       1. Proliferative diabetic retinopathy, both eyes  - pt lost to f/u since Nov 2022 (5 mos) due to health issues and MI in Jan 2023 - s/p IVA OD #1 (09.14.22), #2 (10.18.22) - s/p PRP OS 11.04.22 (incomiplete / segmental only) - pt unable to tolerate laser with topical anesthesia -- **will need subconj block for PRP** - exam shows scattered MA, DBH, early NVE OU - FA (09.14.22) shows +focal NVE OU, late leaking MA's OU and vascular perfusion defect OU - OCT shows persistent IRF/DME OD  - recommend holding injection today due to MI in January - pt in agreement - f/u 6-8 weeks -- DFE/OCT/FA (transit OD), possible injection  2,3. Hypertensive retinopathy OU - discussed importance of tight BP control - monitor   4. Pseudophakia OU  - s/p CE/IOL  - IOL in good position, doing well  - monitor   Ophthalmic Meds Ordered this visit:  No orders of the defined types were placed in this encounter.    No follow-ups on file.  There are no Patient Instructions on file for this visit.   Explained the diagnoses, plan, and follow up with the patient and they expressed understanding.  Patient expressed understanding of the importance of proper follow up care.   This document serves as a record of services personally performed by Gardiner Sleeper, MD, PhD. It was created on their behalf by Renaldo Reel, [place] an ophthalmic technician. The creation of this record is the provider's dictation and/or activities during the visit.    Electronically signed by:  Renaldo Reel, COT  01.23.24 7:56 AM    Gardiner Sleeper, M.D., Ph.D. Diseases & Surgery of the Retina and Vitreous [facility]: M myopia (nearsighted); A astigmatism; H hyperopia  (farsighted); P presbyopia; Mrx spectacle prescription;  CTL contact lenses; OD right eye; OS left eye; OU both eyes  XT exotropia; ET esotropia; PEK punctate epithelial keratitis; PEE punctate epithelial erosions; DES dry eye syndrome; MGD meibomian gland dysfunction; ATs artificial tears; PFAT's preservative free artificial tears; [place] nuclear sclerotic cataract; PSC posterior subcapsular cataract; ERM epi-retinal membrane; PVD posterior vitreous detachment; RD retinal detachment; DM diabetes mellitus; DR diabetic retinopathy; NPDR non-proliferative diabetic retinopathy; PDR proliferative diabetic retinopathy; CSME clinically significant macular edema; DME diabetic macular edema; dbh dot blot hemorrhages; CWS cotton wool spot; POAG primary open angle glaucoma; C/D cup-to-disc ratio; HVF humphrey visual field; GVF goldmann visual field; OCT optical coherence tomography;  IOP intraocular pressure; BRVO Branch retinal vein occlusion; CRVO central retinal vein occlusion; CRAO central retinal artery occlusion; BRAO branch retinal artery occlusion; RT retinal tear; SB scleral buckle; PPV pars plana vitrectomy; VH Vitreous hemorrhage; PRP panretinal laser photocoagulation; IVK intravitreal kenalog; VMT vitreomacular traction; MH Macular hole;  NVD neovascularization of the disc; NVE neovascularization elsewhere; AREDS age related eye disease study; ARMD age related macular degeneration; POAG primary open angle glaucoma; EBMD epithelial/anterior basement membrane dystrophy; ACIOL anterior chamber intraocular lens; IOL intraocular lens; PCIOL posterior chamber intraocular lens; Phaco/IOL phacoemulsification with intraocular lens placement; [place] photorefractive keratectomy; LASIK laser assisted in situ keratomileusis; HTN hypertension; DM diabetes mellitus; COPD chronic obstructive pulmonary disease

## 2022-08-19 ENCOUNTER — Other Ambulatory Visit: Payer: Self-pay

## 2022-08-19 ENCOUNTER — Observation Stay (HOSPITAL_COMMUNITY)
Admission: EM | Admit: 2022-08-19 | Discharge: 2022-08-19 | Disposition: A | Discharge status: Home / Self Care | Payer: 59 | Attending: Internal Medicine | Admitting: Internal Medicine

## 2022-08-19 ENCOUNTER — Emergency Department (HOSPITAL_COMMUNITY): Payer: 59

## 2022-08-19 ENCOUNTER — Encounter (HOSPITAL_COMMUNITY): Payer: Self-pay

## 2022-08-19 DIAGNOSIS — I255 Ischemic cardiomyopathy: Secondary | ICD-10-CM | POA: Diagnosis present

## 2022-08-19 DIAGNOSIS — N1831 Chronic kidney disease, stage 3a: Secondary | ICD-10-CM | POA: Diagnosis not present

## 2022-08-19 DIAGNOSIS — J111 Influenza due to unidentified influenza virus with other respiratory manifestations: Secondary | ICD-10-CM | POA: Diagnosis not present

## 2022-08-19 DIAGNOSIS — I10 Essential (primary) hypertension: Secondary | ICD-10-CM | POA: Diagnosis not present

## 2022-08-19 DIAGNOSIS — E162 Hypoglycemia, unspecified: Secondary | ICD-10-CM | POA: Diagnosis not present

## 2022-08-19 DIAGNOSIS — Z79899 Other long term (current) drug therapy: Secondary | ICD-10-CM | POA: Diagnosis not present

## 2022-08-19 DIAGNOSIS — R051 Acute cough: Secondary | ICD-10-CM

## 2022-08-19 DIAGNOSIS — R059 Cough, unspecified: Secondary | ICD-10-CM | POA: Diagnosis not present

## 2022-08-19 DIAGNOSIS — I5020 Unspecified systolic (congestive) heart failure: Secondary | ICD-10-CM | POA: Diagnosis not present

## 2022-08-19 DIAGNOSIS — E119 Type 2 diabetes mellitus without complications: Secondary | ICD-10-CM

## 2022-08-19 DIAGNOSIS — Z1152 Encounter for screening for COVID-19: Secondary | ICD-10-CM | POA: Insufficient documentation

## 2022-08-19 DIAGNOSIS — Z87891 Personal history of nicotine dependence: Secondary | ICD-10-CM | POA: Insufficient documentation

## 2022-08-19 DIAGNOSIS — Z95810 Presence of automatic (implantable) cardiac defibrillator: Secondary | ICD-10-CM | POA: Diagnosis present

## 2022-08-19 DIAGNOSIS — I2510 Atherosclerotic heart disease of native coronary artery without angina pectoris: Secondary | ICD-10-CM | POA: Diagnosis not present

## 2022-08-19 DIAGNOSIS — E11649 Type 2 diabetes mellitus with hypoglycemia without coma: Secondary | ICD-10-CM | POA: Insufficient documentation

## 2022-08-19 DIAGNOSIS — I5040 Unspecified combined systolic (congestive) and diastolic (congestive) heart failure: Secondary | ICD-10-CM | POA: Diagnosis present

## 2022-08-19 LAB — RESP PANEL BY RT-PCR (RSV, FLU A&B, COVID)  RVPGX2
Influenza A by PCR: POSITIVE — AB
Influenza B by PCR: NEGATIVE
Resp Syncytial Virus by PCR: NEGATIVE
SARS Coronavirus 2 by RT PCR: NEGATIVE

## 2022-08-19 LAB — CBC
HCT: 42.5 % (ref 39.0–52.0)
Hemoglobin: 14.7 g/dL (ref 13.0–17.0)
MCH: 32.5 pg (ref 26.0–34.0)
MCHC: 34.6 g/dL (ref 30.0–36.0)
MCV: 94 fL (ref 80.0–100.0)
Platelets: 168 10*3/uL (ref 150–400)
RBC: 4.52 MIL/uL (ref 4.22–5.81)
RDW: 12.8 % (ref 11.5–15.5)
WBC: 9.2 10*3/uL (ref 4.0–10.5)
nRBC: 0 % (ref 0.0–0.2)

## 2022-08-19 LAB — COMPREHENSIVE METABOLIC PANEL
ALT: 37 U/L (ref 0–44)
AST: 43 U/L — ABNORMAL HIGH (ref 15–41)
Albumin: 3.2 g/dL — ABNORMAL LOW (ref 3.5–5.0)
Alkaline Phosphatase: 77 U/L (ref 38–126)
Anion gap: 9 (ref 5–15)
BUN: 24 mg/dL — ABNORMAL HIGH (ref 8–23)
CO2: 21 mmol/L — ABNORMAL LOW (ref 22–32)
Calcium: 8.2 mg/dL — ABNORMAL LOW (ref 8.9–10.3)
Chloride: 108 mmol/L (ref 98–111)
Creatinine, Ser: 1.21 mg/dL (ref 0.61–1.24)
GFR, Estimated: >60 mL/min (ref >60)
Glucose, Bld: 54 mg/dL — ABNORMAL LOW (ref 70–99)
Potassium: 3.7 mmol/L (ref 3.5–5.1)
Sodium: 138 mmol/L (ref 135–145)
Total Bilirubin: 0.5 mg/dL (ref 0.3–1.2)
Total Protein: 6.4 g/dL — ABNORMAL LOW (ref 6.5–8.1)

## 2022-08-19 LAB — CBG MONITORING, ED
Glucose-Capillary: 164 mg/dL — ABNORMAL HIGH (ref 70–99)
Glucose-Capillary: 37 mg/dL — CL (ref 70–99)
Glucose-Capillary: 91 mg/dL (ref 70–99)
Glucose-Capillary: 147 mg/dL — ABNORMAL HIGH (ref 70–99)
Glucose-Capillary: 53 mg/dL — ABNORMAL LOW (ref 70–99)
Glucose-Capillary: 188 mg/dL — ABNORMAL HIGH (ref 70–99)
Glucose-Capillary: 158 mg/dL — ABNORMAL HIGH (ref 70–99)
Glucose-Capillary: 328 mg/dL — ABNORMAL HIGH (ref 70–99)

## 2022-08-19 MED ORDER — DEXTROSE 50 % IV SOLN
INTRAVENOUS | Status: AC
  Administered 2022-08-19: 50 mL via INTRAVENOUS
  Filled 2022-08-19: qty 50

## 2022-08-19 MED ORDER — ALBUTEROL SULFATE HFA 108 (90 BASE) MCG/ACT IN AERS: 2.0000 | INHALATION_SPRAY | Freq: Four times a day (QID) | RESPIRATORY_TRACT | 0 refills | Status: DC | PRN

## 2022-08-19 MED ORDER — INSULIN GLARGINE 100 UNITS/ML SOLOSTAR PEN: 38.0000 [IU] | PEN_INJECTOR | Freq: Every day | SUBCUTANEOUS | 1 refills | Status: AC

## 2022-08-19 MED ORDER — SPIRONOLACTONE 25 MG PO TABS
25.0000 mg | ORAL_TABLET | Freq: Every day | ORAL | Status: DC
  Administered 2022-08-19: 25 mg via ORAL
  Filled 2022-08-19: qty 1

## 2022-08-19 MED ORDER — DEXTROSE 50 % IV SOLN: 1.0000 | Freq: Once | INTRAVENOUS | Status: AC

## 2022-08-19 MED ORDER — ASPIRIN 81 MG PO TBEC: 81.0000 mg | DELAYED_RELEASE_TABLET | Freq: Every day | ORAL | 2 refills | Status: AC

## 2022-08-19 MED ORDER — SACUBITRIL-VALSARTAN 49-51 MG PO TABS
1.0000 | ORAL_TABLET | Freq: Two times a day (BID) | ORAL | Status: DC
  Administered 2022-08-19: 1 via ORAL
  Filled 2022-08-19: qty 1

## 2022-08-19 MED ORDER — ATORVASTATIN CALCIUM 40 MG PO TABS
80.0000 mg | ORAL_TABLET | Freq: Every day | ORAL | Status: DC
  Administered 2022-08-19: 80 mg via ORAL
  Filled 2022-08-19: qty 2

## 2022-08-19 MED ORDER — METOPROLOL SUCCINATE ER 25 MG PO TB24
25.0000 mg | ORAL_TABLET | Freq: Every day | ORAL | Status: DC
  Administered 2022-08-19: 25 mg via ORAL
  Filled 2022-08-19: qty 1

## 2022-08-19 MED ORDER — KCL IN DEXTROSE-NACL 10-5-0.45 MEQ/L-%-% IV SOLN
INTRAVENOUS | Status: DC
  Filled 2022-08-19: qty 1000

## 2022-08-19 MED ORDER — BENZONATATE 100 MG PO CAPS: 100.0000 mg | ORAL_CAPSULE | Freq: Three times a day (TID) | ORAL | 0 refills | Status: DC

## 2022-08-19 MED ORDER — OSELTAMIVIR PHOSPHATE 75 MG PO CAPS
75.0000 mg | ORAL_CAPSULE | Freq: Once | ORAL | Status: AC
  Administered 2022-08-19: 75 mg via ORAL
  Filled 2022-08-19: qty 1

## 2022-08-19 MED ORDER — DEXTROSE 50 % IV SOLN
INTRAVENOUS | Status: AC
  Administered 2022-08-19: 25 mL via INTRAVENOUS
  Filled 2022-08-19: qty 50

## 2022-08-19 MED ORDER — OSELTAMIVIR PHOSPHATE 75 MG PO CAPS: 75.0000 mg | ORAL_CAPSULE | Freq: Two times a day (BID) | ORAL | 0 refills | Status: AC

## 2022-08-19 NOTE — ED Notes (Signed)
Pt provided sandwich bag and Soda. Assisted to seated position

## 2022-08-19 NOTE — ED Notes (Signed)
Pt notified staff that he was starting to feel like his sugar was low again. CBG 53 provider notified, provider ordered D50 amp. Pt also give 8oz of apple juice  After half the amp pt started to have pain in upper arm and shoulder above IV site, RN stopped pushing D50. Flushed line with 20 cc of NS and notified provider. IV continues to have blood return and flush without resistance. Provider states that if pt is able to tolerate PO he can eat and drink more to try and raise CBG.

## 2022-08-19 NOTE — ED Triage Notes (Signed)
Pt presents with cough, chest congestion, fever, SOB that has been going on for a week .

## 2022-08-19 NOTE — ED Provider Notes (Addendum)
[facility] Emergency Department Provider Note MRN:  [id number]  Arrival date & time: 08/19/22     Chief Complaint   Cough   History of Present Illness   Jose Clark is a 72 y.o. year-old male with a history of CKD, diabetes presenting to the ED with chief complaint of cough.  1 week of persistent cough, occasional chills.  No chest pain or shortness of breath, no abdominal pain.  Review of Systems  A thorough review of systems was obtained and all systems are negative except as noted in the HPI and PMH.   Patient's Health History    Past Medical History:  Diagnosis Date   CAD (coronary artery disease)    Chronic kidney disease, stage 3a (HCC)    Diabetes ([place])    x 20 yrs   Diabetes mellitus    Heart disease    Hepatitis C    HFrEF (heart failure with reduced ejection fraction) (HCC)    Ischemic cardiomyopathy    Mitral regurgitation    Tobacco abuse     Past Surgical History:  Procedure Laterality Date   CARDIAC DEFIBRILLATOR PLACEMENT Left 01/20/2022   CIRCUMCISION     30 years ago   COLONOSCOPY N/A 01/24/2016   Procedure: COLONOSCOPY;  Surgeon: Rogene Houston, MD;  [facility];  Service: Endoscopy;  Laterality: N/A;  1130   CORONARY/GRAFT ACUTE MI REVASCULARIZATION N/A 08/03/2021   Procedure: Coronary/Graft Acute MI Revascularization;  Surgeon: Burnell Blanks, MD;  Location: [facility];  Service: Cardiovascular;  Laterality: N/A;   LEFT HEART CATH AND CORONARY ANGIOGRAPHY N/A 08/03/2021   Procedure: LEFT HEART CATH AND CORONARY ANGIOGRAPHY;  Surgeon: Burnell Blanks, MD;  Location: [facility];  Service: Cardiovascular;  Laterality: N/A;   SUBQ ICD IMPLANT N/A 01/20/2022   Procedure: SUBQ ICD IMPLANT;  Surgeon: Deboraha Sprang, MD;  Location: [facility];  Service: Cardiovascular;  Laterality: N/A;    Family History  Problem Relation Age of Onset   Dementia Mother    CVA Father      Social History   Socioeconomic History   Marital status: Single    Spouse name: Not on file   Number of children: 1   Years of education: Not on file   Highest education level: High school graduate  Occupational History   Occupation: retired  Tobacco Use   Smoking status: Former    Packs/day: 0.50    Years: 20.00    Total pack years: 10.00    Types: Cigarettes    Quit date: 08/04/2020    Years since quitting: 2.0   Smokeless tobacco: Never   Tobacco comments:    Quit upon admission to hospital.  Vaping Use   Vaping Use: Never used  Substance and Sexual Activity   Alcohol use: Not Currently    Comment: occassionally  does not drink everyday. Occasional beer.   Drug use: Not Currently    Comment: cocaine   Sexual activity: Yes  Other Topics Concern   Not on file  Social History Narrative   Not on file   Social Determinants of Health   Financial Resource Strain: Low Risk  (08/06/2021)   Overall Financial Resource Strain (CARDIA)    Difficulty of Paying Living Expenses: Not very hard  Food Insecurity: No Food Insecurity (08/06/2021)   Hunger Vital Sign    Worried About Running Out of Food in the Last Year: Never true  Ran Out of Food in the Last Year: Never true  Transportation Needs: No Transportation Needs (08/06/2021)   PRAPARE - Hydrologist (Medical): No    Lack of Transportation (Non-Medical): No  Physical Activity: Not on file  Stress: Not on file  Social Connections: Not on file  Intimate Partner Violence: Not on file     Physical Exam   Vitals:   08/19/22 0400 08/19/22 0500  BP: 135/82 (!) 148/92  Pulse: 80 84  Resp: 18 18  Temp:    SpO2: 97% 97%    CONSTITUTIONAL: Well-appearing, NAD NEURO/PSYCH:  Alert and oriented x 3, no focal deficits EYES:  eyes equal and reactive ENT/NECK:  no LAD, no JVD CARDIO: Regular rate, well-perfused, normal S1 and S2 PULM:  CTAB no wheezing or rhonchi GI/GU:  non-distended,  non-tender MSK/SPINE:  No gross deformities, no edema SKIN:  no rash, atraumatic   *Additional and/or pertinent findings included in MDM below  Diagnostic and Interventional Summary    EKG Interpretation  Date/Time:  Tuesday August 19 2022 01:28:07 EST Ventricular Rate:  97 PR Interval:  162 QRS Duration: 84 QT Interval:  338 QTC Calculation: 429 R Axis:   23 Text Interpretation: Normal sinus rhythm Anterior infarct (cited on or before 12-Aug-2021) Abnormal ECG When compared with ECG of 12-Aug-2021 12:25, Premature atrial complexes are no longer Present Serial changes of Anterior infarct Present Confirmed by Gerlene Fee [phone]) on 08/19/2022 2:00:39 AM       Labs Reviewed  RESP PANEL BY RT-PCR (RSV, FLU A&B, COVID)  RVPGX2 - Abnormal; Notable for the following components:      Result Value   Influenza A by PCR POSITIVE (*)    All other components within normal limits  COMPREHENSIVE METABOLIC PANEL - Abnormal; Notable for the following components:   CO2 21 (*)    Glucose, Bld 54 (*)    BUN 24 (*)    Calcium 8.2 (*)    Total Protein 6.4 (*)    Albumin 3.2 (*)    AST 43 (*)    All other components within normal limits  CBG MONITORING, ED - Abnormal; Notable for the following components:   Glucose-Capillary 37 (*)    All other components within normal limits  CBG MONITORING, ED - Abnormal; Notable for the following components:   Glucose-Capillary 164 (*)    All other components within normal limits  CBG MONITORING, ED - Abnormal; Notable for the following components:   Glucose-Capillary 53 (*)    All other components within normal limits  CBG MONITORING, ED - Abnormal; Notable for the following components:   Glucose-Capillary 147 (*)    All other components within normal limits  CULTURE, BLOOD (ROUTINE X 2)  CULTURE, BLOOD (ROUTINE X 2)  CBC  CBG MONITORING, ED  CBG MONITORING, ED    DG Chest 2 View  Final Result      Medications  oseltamivir (TAMIFLU)  capsule 75 mg (has no administration in time range)  dextrose 5 % and 0.45 % NaCl with KCl 10 mEq/L infusion (has no administration in time range)  dextrose 50 % solution 50 mL (50 mLs Intravenous Given 08/19/22 0254)  dextrose 50 % solution 50 mL (25 mLs Intravenous Given 08/19/22 0450)     Procedures  /  Critical Care Procedures  ED Course and Medical Decision Making  Initial Impression and Ddx Persistent cough, suspect bronchitis or flu or COVID, pneumonia also considered.  Sitting comfortably no  acute distress, no increased work of breathing, clear lungs.  Highly doubt PE.  Past medical/surgical history that increases complexity of ED encounter: Diabetes  Interpretation of Diagnostics I personally reviewed the Chest Xray and my interpretation is as follows: No lobar opacity or pneumothorax    Patient Reassessment and Ultimate Disposition/Management Clinical Course as of 08/19/22 0531  Tue Aug 19, 2022  0300 Decreased responsiveness episode observed by nurse, glucose 37--improving with oral and iv dextrose.  DDX expanded to include bacteremia, electropyte disturbance [MB]    Clinical Course User Index [MB] Maudie Flakes, MD     Patient with a recurrent episode of hypoglycemia here in the emergency department requiring an additional D50 dose.  Has tested positive for influenza.  Will admit to medicine for observation.  Starting D5 drip.  Patient management required discussion with the following services or consulting groups:  None  Complexity of Problems Addressed Acute complicated illness or Injury  Additional Data Reviewed and Analyzed Further history obtained from: Prior labs/imaging results  Additional Factors Impacting ED Encounter Risk Prescriptions  Barth Kirks. Sedonia Small, MD [place] [email or web address]  Final Clinical Impressions(s) / ED Diagnoses     ICD-10-CM   1. Acute cough  R05.1     2. Influenza  J11.1      3. Hypoglycemia  E16.2       ED Discharge Orders          Ordered    benzonatate (TESSALON) 100 MG capsule  Every 8 hours        08/19/22 0159             Discharge Instructions Discussed with and Provided to Patient:    Discharge Instructions      You were evaluated in the Emergency Department and after careful evaluation, we did not find any emergent condition requiring admission or further testing in the hospital.  Your exam/testing today is overall reassuring.  Please return to the Emergency Department if you experience any worsening of your condition.   Thank you for allowing [place] to be a part of your care.      Maudie Flakes, MD 08/19/22 0201    Maudie Flakes, MD 08/19/22 [phone]

## 2022-08-19 NOTE — Consult Note (Signed)
Patient Demographics:    Jose Clark, is a 72 y.o. male  MRN: [id number]   DOB - Oct 11, 1950  Admit Date - 08/19/2022  Outpatient Primary MD for the patient is Celene Squibb, MD   Assessment & Plan:   Assessment and Plan:  Problem  Hypoglycemia  Combined systolic and diastolic congestive heart failure /EF 30-35 %/G1DD  Icd (Implantable Cardioverter-Defibrillator) in Place  Ischemic Cardiomyopathy  Diabetes (Hcc)     1)DM2-patient presented with recurrent hypoglycemia due to missed meals after taking insulin -Required IV dextrose -No further hypoglycemia of IV dextrose -Okay to discharge home on PTA insulin regimen with only 1 change--will decrease Lantus insulin to 38 units from 40 units -Follow-up with PCP for further adjustments  2) influenza A--no hypoxia no pneumonia -Empiric treatment with albuterol as needed anti-tussives  -Tamiflu as prescribed -Given significant comorbidities including low EF CHF patient is strongly encouraged to get the flu shot every year which he did not do last year  3)HFrEF/ischemic cardiomyopathy/AICD in Situ--- patient with chronic, systolic dysfunction CHF -EF 30 to 35% with global hypokinesis and grade 1 diastolic dysfunction echo from 11/12/2021 -overall stable, clinically appears compensated at this time -Resume Entresto and Aldactone, along with Jardiance and Toprol-XL  4)CAD -history anterior  STEMI Jan 2023 - DES to prox LAD Echo 4/23, EF 30-35% with mildly decreased RV function, IVC normal -Chest pain-free,- - discharged on aspirin, Lipitor and Toprol-XL  Allergies as of 08/19/2022   No Known Allergies      Medication List     TAKE these medications    albuterol 108 (90 Base) MCG/ACT inhaler Commonly known as: VENTOLIN HFA Inhale 2 puffs into the lungs  every 6 (six) hours as needed for wheezing or shortness of breath (cough).   aspirin EC 81 MG tablet Take 1 tablet (81 mg total) by mouth daily with breakfast.   atorvastatin 80 MG tablet Commonly known as: LIPITOR Take 1 tablet (80 mg total) by mouth daily.   benzonatate 100 MG capsule Commonly known as: TESSALON Take 1 capsule (100 mg total) by mouth every 8 (eight) hours.   empagliflozin 25 MG Tabs tablet Commonly known as: JARDIANCE Take 25 mg by mouth daily.   Entresto 49-51 MG Generic drug: sacubitril-valsartan Take 1 tablet by mouth 2 (two) times daily.   insulin glargine 100 unit/mL Sopn Commonly known as: LANTUS Inject 38 Units into the skin at bedtime. What changed: how much to take   insulin NPH-regular Human (70-30) 100 UNIT/ML injection Inject 10-25 Units into the skin daily as needed (CBG >180).   metoprolol succinate 25 MG 24 hr tablet Commonly known as: TOPROL-XL Take 25 mg by mouth daily.   nitroGLYCERIN 0.4 MG SL tablet Commonly known as: Nitrostat Place 1 tablet (0.4 mg total) under the tongue every 5 (five) minutes as needed.   oseltamivir 75 MG capsule Commonly known as: Tamiflu Take 1 capsule (75 mg total) by mouth 2 (  two) times daily for 5 days.   Rybelsus 7 MG Tabs Generic drug: Semaglutide Take 7 mg by mouth daily. As needed   spironolactone 25 MG tablet Commonly known as: ALDACTONE Take 1 tablet (25 mg total) by mouth daily.       Dispo: The patient is from: Home              Anticipated d/c is to: Home    With History of - Reviewed by me  Past Medical History:  Diagnosis Date   CAD (coronary artery disease)    Chronic kidney disease, stage 3a (HCC)    Diabetes ([place])    x 20 yrs   Diabetes mellitus    Heart disease    Hepatitis C    HFrEF (heart failure with reduced ejection fraction) (HCC)    Ischemic cardiomyopathy    Mitral regurgitation    Tobacco abuse       Past Surgical History:  Procedure Laterality Date    CARDIAC DEFIBRILLATOR PLACEMENT Left 01/20/2022   CIRCUMCISION     30 years ago   COLONOSCOPY N/A 01/24/2016   Procedure: COLONOSCOPY;  Surgeon: Rogene Houston, MD;  [facility]: [facility];  Service: Endoscopy;  Laterality: N/A;  1130   CORONARY/GRAFT ACUTE MI REVASCULARIZATION N/A 08/03/2021   Procedure: Coronary/Graft Acute MI Revascularization;  Surgeon: Burnell Blanks, MD;  Location: [place] [facility];  Service: Cardiovascular;  Laterality: N/A;   LEFT HEART CATH AND CORONARY ANGIOGRAPHY N/A 08/03/2021   Procedure: LEFT HEART CATH AND CORONARY ANGIOGRAPHY;  Surgeon: Burnell Blanks, MD;  Location: [place] [facility];  Service: Cardiovascular;  Laterality: N/A;   SUBQ ICD IMPLANT N/A 01/20/2022   Procedure: SUBQ ICD IMPLANT;  Surgeon: Deboraha Sprang, MD;  Location: [place] [facility];  Service: Cardiovascular;  Laterality: N/A;      Chief Complaint  Patient presents with   Cough      HPI:    Jose Clark  is a 72 y.o. male with past medical history relevant for DM2, CAD with history of ischemic and myopathy, combined systolic and diastolic dysfunction CHF with status post AICD placement history of hep C who presented to the ED with cough congestion, fevers and dyspnea, had chills myalgias and fatigue--symptoms have persisted for several days now -Patient admits to not getting flu shot this year -In ED patient tested positive for influenza A -Evaluated patient was found to be persistently hypoglycemic -Patient admits to taking for Jardiance, Lantus insulin and short acting insulin prior to coming to the ER and did not have enough time to eat -Despite IV dextrose hypoglycemia persisted Initially -Patient without frank chest pains dizziness or palpitations -Chest x-ray without acute pulmonary findings -EKG sinus rhythm without acute changes -Creatinine 1.21 -CBC WNL -Blood cultures pending   Review of systems:    In addition to the HPI above,   A full Review  of  Systems was done, all other systems reviewed are negative except as noted above in HPI , .    Social History:  Reviewed by me    Social History   Tobacco Use   Smoking status: Former    Packs/day: 0.50    Years: 20.00    Total pack years: 10.00    Types: Cigarettes    Quit date: 08/04/2020    Years since quitting: 2.0   Smokeless tobacco: Never   Tobacco comments:    Quit upon admission to hospital.  Substance Use Topics  Alcohol use: Not Currently    Comment: occassionally  does not drink everyday. Occasional beer.    Family History :  Reviewed by me    Family History  Problem Relation Age of Onset   Dementia Mother    CVA Father      Home Medications:   Prior to Admission medications   Medication Sig Start Date End Date Taking? Authorizing Provider  albuterol (VENTOLIN HFA) 108 (90 Base) MCG/ACT inhaler Inhale 2 puffs into the lungs every 6 (six) hours as needed for wheezing or shortness of breath (cough). 08/19/22  Yes Job Holtsclaw, MD  benzonatate (TESSALON) 100 MG capsule Take 1 capsule (100 mg total) by mouth every 8 (eight) hours. 08/19/22  Yes Maudie Flakes, MD  oseltamivir (TAMIFLU) 75 MG capsule Take 1 capsule (75 mg total) by mouth 2 (two) times daily for 5 days. 08/19/22 08/24/22 Yes Sequoyah Ramone, MD  atorvastatin (LIPITOR) 80 MG tablet Take 1 tablet (80 mg total) by mouth daily. 08/07/21   Bhagat, Crista Luria, PA  empagliflozin (JARDIANCE) 25 MG TABS tablet Take 25 mg by mouth daily.    [provider]  insulin glargine (LANTUS) 100 unit/mL SOPN Inject 38 Units into the skin at bedtime. 08/19/22   Roxan Hockey, MD  insulin NPH-insulin regular (NOVOLIN 70/30) (70-30) 100 UNIT/ML injection Inject 10-25 Units into the skin daily as needed (CBG >180).    [provider]  metoprolol succinate (TOPROL-XL) 25 MG 24 hr tablet Take 25 mg by mouth daily.    [provider]  nitroGLYCERIN (NITROSTAT) 0.4 MG SL tablet Place 1 tablet  (0.4 mg total) under the tongue every 5 (five) minutes as needed. 08/06/21   Bhagat, Bhavinkumar, PA  RYBELSUS 7 MG TABS Take 7 mg by mouth daily. As needed 09/17/21   [provider]  sacubitril-valsartan (ENTRESTO) 49-51 MG Take 1 tablet by mouth 2 (two) times daily. 10/03/21   Larey Dresser, MD  spironolactone (ALDACTONE) 25 MG tablet Take 1 tablet (25 mg total) by mouth daily. 07/03/22   Arnoldo Lenis, MD     Allergies:    No Known Allergies   Physical Exam:   Vitals  Blood pressure 133/77, pulse 83, temperature 98.4 F (36.9 C), temperature source Oral, resp. rate 18, height '5\' 11"'$  (1.803 m), weight 81.6 kg, SpO2 99 %.  Physical Examination: General appearance - alert,  in no distress  Mental status - alert, oriented to person, place, and time,  Eyes - sclera anicteric Neck - supple, no JVD elevation , Chest - clear  to auscultation bilaterally, symmetrical air movement,  Heart - S1 and S2 normal, regular , AICD in situ Abdomen - soft, nontender, nondistended, +BS Neurological - screening mental status exam normal, neck supple without rigidity, cranial nerves II through XII intact, DTR's normal and symmetric Extremities - no pedal edema noted, intact peripheral pulses  Skin - warm, dry MSK-right forearm area infiltrated from IV.... Advised patient to keep arm elevated--no evidence of neurovascular compromise at this time     Data Review:    CBC Recent Labs  Lab 08/19/22 0308  WBC 9.2  HGB 14.7  HCT 42.5  PLT 168  MCV 94.0  MCH 32.5  MCHC 34.6  RDW 12.8   ------------------------------------------------------------------------------------------------------------------  Chemistries  Recent Labs  Lab 08/19/22 0308  NA 138  K 3.7  CL 108  CO2 21*  GLUCOSE 54*  BUN 24*  CREATININE 1.21  CALCIUM 8.2*  AST 43*  ALT 37  ALKPHOS 77  BILITOT 0.5    ------------------------------------------------------------------------------------------------------------------ estimated creatinine clearance is 60.5 mL/min (by C-G formula based on SCr of 1.21 mg/dL). ------------------------------------------------------------------------------------------------------------------ ------------------------------------------------------------------------------------------------------------------    Component Value Date/Time   BNP 81.9 03/14/2022 0936   Urinalysis    Component Value Date/Time   COLORURINE AMBER (A) 08/22/2020 0008   APPEARANCEUR HAZY (A) 08/22/2020 0008   LABSPEC 1.028 08/22/2020 0008   PHURINE 5.0 08/22/2020 0008   GLUCOSEU NEGATIVE 08/22/2020 0008   HGBUR NEGATIVE 08/22/2020 0008   BILIRUBINUR NEGATIVE 08/22/2020 0008   KETONESUR 20 (A) 08/22/2020 0008   PROTEINUR >=300 (A) 08/22/2020 0008   NITRITE NEGATIVE 08/22/2020 0008   LEUKOCYTESUR NEGATIVE 08/22/2020 0008     Imaging Results:    DG Chest 2 View  Result Date: 08/19/2022 CLINICAL DATA:  Cough and congestion for 1 week EXAM: CHEST - 2 VIEW COMPARISON:  01/20/2022 FINDINGS: Cardiac shadow is stable. Anterior defibrillator is noted. The lungs are clear bilaterally. No bony abnormality is noted. IMPRESSION: No active cardiopulmonary disease. Electronically Signed   By: Inez Catalina M.D.   On: 08/19/2022 01:28    Radiological Exams on Admission: DG Chest 2 View  Result Date: 08/19/2022 CLINICAL DATA:  Cough and congestion for 1 week EXAM: CHEST - 2 VIEW COMPARISON:  01/20/2022 FINDINGS: Cardiac shadow is stable. Anterior defibrillator is noted. The lungs are clear bilaterally. No bony abnormality is noted. IMPRESSION: No active cardiopulmonary disease. Electronically Signed   By: Inez Catalina M.D.   On: 08/19/2022 01:28    Condition  -stable  Roxan Hockey M.D on 08/19/2022 at 10:33 AM Go to [email or web address] -  for contact info  [facility] - Office   [phone]

## 2022-08-19 NOTE — ED Notes (Signed)
Pt called RN into room states that he has started to feel like his sugar might be low, he feels hot and is shaking. CBG was 37  RN gave 12oz of apple juice and notified provider IV placed and pt continued to have increased lethargy, D50 amp given   Pt state that he has already started to feel a little bit more awake

## 2022-08-19 NOTE — Discharge Instructions (Addendum)
1) please always when you take your insulin--- please avoid going long periods of time without eating while taking insulin 2)follow up with Celene Squibb, MD (primary care physician in about a week or so for recheck and reevaluation)

## 2022-08-24 LAB — CULTURE, BLOOD (ROUTINE X 2)
Culture: NO GROWTH
Culture: NO GROWTH
Special Requests: ADEQUATE
Special Requests: ADEQUATE

## 2022-08-26 ENCOUNTER — Encounter (INDEPENDENT_AMBULATORY_CARE_PROVIDER_SITE_OTHER): Payer: 59 | Admitting: Ophthalmology

## 2022-08-26 ENCOUNTER — Encounter (INDEPENDENT_AMBULATORY_CARE_PROVIDER_SITE_OTHER): Payer: Self-pay

## 2022-08-26 DIAGNOSIS — E113513 Type 2 diabetes mellitus with proliferative diabetic retinopathy with macular edema, bilateral: Secondary | ICD-10-CM

## 2022-08-26 DIAGNOSIS — H35033 Hypertensive retinopathy, bilateral: Secondary | ICD-10-CM

## 2022-08-26 DIAGNOSIS — Z961 Presence of intraocular lens: Secondary | ICD-10-CM

## 2022-08-26 DIAGNOSIS — I10 Essential (primary) hypertension: Secondary | ICD-10-CM

## 2022-11-24 ENCOUNTER — Other Ambulatory Visit (HOSPITAL_COMMUNITY): Payer: Self-pay | Admitting: Cardiology

## 2022-11-26 DIAGNOSIS — E1165 Type 2 diabetes mellitus with hyperglycemia: Secondary | ICD-10-CM | POA: Diagnosis not present

## 2022-11-26 DIAGNOSIS — E785 Hyperlipidemia, unspecified: Secondary | ICD-10-CM | POA: Diagnosis not present

## 2022-11-26 DIAGNOSIS — I10 Essential (primary) hypertension: Secondary | ICD-10-CM | POA: Diagnosis not present

## 2022-12-05 ENCOUNTER — Encounter (HOSPITAL_COMMUNITY): Payer: Self-pay | Admitting: Emergency Medicine

## 2022-12-05 ENCOUNTER — Emergency Department (HOSPITAL_COMMUNITY)
Admission: EM | Admit: 2022-12-05 | Discharge: 2022-12-05 | Disposition: A | Discharge status: Home / Self Care | Payer: 59 | Attending: Emergency Medicine | Admitting: Emergency Medicine

## 2022-12-05 ENCOUNTER — Other Ambulatory Visit: Payer: Self-pay

## 2022-12-05 DIAGNOSIS — W57XXXA Bitten or stung by nonvenomous insect and other nonvenomous arthropods, initial encounter: Secondary | ICD-10-CM | POA: Diagnosis not present

## 2022-12-05 DIAGNOSIS — Z794 Long term (current) use of insulin: Secondary | ICD-10-CM | POA: Diagnosis not present

## 2022-12-05 DIAGNOSIS — S40261A Insect bite (nonvenomous) of right shoulder, initial encounter: Secondary | ICD-10-CM | POA: Diagnosis not present

## 2022-12-05 DIAGNOSIS — Z79899 Other long term (current) drug therapy: Secondary | ICD-10-CM | POA: Insufficient documentation

## 2022-12-05 DIAGNOSIS — R5383 Other fatigue: Secondary | ICD-10-CM | POA: Diagnosis not present

## 2022-12-05 DIAGNOSIS — Z7982 Long term (current) use of aspirin: Secondary | ICD-10-CM | POA: Diagnosis not present

## 2022-12-05 DIAGNOSIS — S40269A Insect bite (nonvenomous) of unspecified shoulder, initial encounter: Secondary | ICD-10-CM | POA: Diagnosis not present

## 2022-12-05 DIAGNOSIS — L03311 Cellulitis of abdominal wall: Secondary | ICD-10-CM | POA: Diagnosis not present

## 2022-12-05 DIAGNOSIS — S30861A Insect bite (nonvenomous) of abdominal wall, initial encounter: Secondary | ICD-10-CM | POA: Diagnosis not present

## 2022-12-05 MED ORDER — DOXYCYCLINE HYCLATE 100 MG PO CAPS: 100.0000 mg | ORAL_CAPSULE | Freq: Two times a day (BID) | ORAL | 0 refills | Status: DC

## 2022-12-05 NOTE — ED Triage Notes (Addendum)
Pt states he removed 2 ticks on Monday. One from Rt upper chest area and one from perineal area. Pt states that they both itch and  and that he started having chills last night. Pt has been scratching "a lot" to perineal area and has scratched some of his skin off. Pt states he has been putting Triple Antibiotic Cream on area.

## 2022-12-05 NOTE — ED Provider Notes (Signed)
[place] EMERGENCY DEPARTMENT AT [facility] Provider Note   CSN: [id number] Arrival date & time: 12/05/22  1922     History  Chief Complaint  Patient presents with   Tick Bites    Jose Clark is a 72 y.o. male with noncontributory medical history who presents with concern for 2 tick bite sustained earlier this week.  Patient reports that there is some skin breakdown at the one over his groin.  He denies any systemic rash.  He denies any generalized fatigue.  No previous history of tickborne illness.  He denies any excessive bleeding, bruising.  HPI     Home Medications Prior to Admission medications   Medication Sig Start Date End Date Taking? Authorizing Provider  doxycycline (VIBRAMYCIN) 100 MG capsule Take 1 capsule (100 mg total) by mouth 2 (two) times daily. 12/05/22  Yes Shilo Pauwels H, PA-C  albuterol (VENTOLIN HFA) 108 (90 Base) MCG/ACT inhaler Inhale 2 puffs into the lungs every 6 (six) hours as needed for wheezing or shortness of breath (cough). 08/19/22   Shon Hale, MD  aspirin EC 81 MG tablet Take 1 tablet (81 mg total) by mouth daily with breakfast. 08/19/22 08/19/23  Shon Hale, MD  atorvastatin (LIPITOR) 80 MG tablet Take 1 tablet (80 mg total) by mouth daily. 08/07/21   Bhagat, Sharrell Ku, PA  benzonatate (TESSALON) 100 MG capsule Take 1 capsule (100 mg total) by mouth every 8 (eight) hours. 08/19/22   Sabas Sous, MD  empagliflozin (JARDIANCE) 25 MG TABS tablet Take 25 mg by mouth daily.    [provider]  insulin glargine (LANTUS) 100 unit/mL SOPN Inject 38 Units into the skin at bedtime. 08/19/22   Shon Hale, MD  insulin NPH-insulin regular (NOVOLIN 70/30) (70-30) 100 UNIT/ML injection Inject 10-25 Units into the skin daily as needed (CBG >180).    [provider]  metoprolol succinate (TOPROL-XL) 25 MG 24 hr tablet Take 1 tablet (25 mg total) by mouth daily. NEEDS FOLLOW UP APPOINTMENT FOR ANYMORE REFILLS  11/24/22   Laurey Morale, MD  nitroGLYCERIN (NITROSTAT) 0.4 MG SL tablet Place 1 tablet (0.4 mg total) under the tongue every 5 (five) minutes as needed. 08/06/21   Bhagat, Bhavinkumar, PA  RYBELSUS 7 MG TABS Take 7 mg by mouth daily. As needed 09/17/21   [provider]  sacubitril-valsartan (ENTRESTO) 49-51 MG Take 1 tablet by mouth 2 (two) times daily. 10/03/21   Laurey Morale, MD  spironolactone (ALDACTONE) 25 MG tablet Take 1 tablet (25 mg total) by mouth daily. 07/03/22   Antoine Poche, MD      Allergies    Patient has no known allergies.    Review of Systems   Review of Systems  All other systems reviewed and are negative.   Physical Exam Updated Vital Signs BP 123/70 (BP Location: Right Arm)   Pulse 76   Temp 98.8 F (37.1 C) (Oral)   Resp 16   Ht 5\' 10"  (1.778 m)   Wt 81.6 kg   SpO2 98%   BMI 25.83 kg/m  Physical Exam Vitals and nursing note reviewed.  Constitutional:      General: He is not in acute distress.    Appearance: Normal appearance.  HENT:     Head: Normocephalic and atraumatic.  Eyes:     General:        Right eye: No discharge.        Left eye: No discharge.  Cardiovascular:  Rate and Rhythm: Normal rate and regular rhythm.  Pulmonary:     Effort: Pulmonary effort is normal. No respiratory distress.  Musculoskeletal:        General: No deformity.  Skin:    General: Skin is warm and dry.     Comments: Patient with erythematous excoriated lesion on anterior right shoulder without significant surrounding cellulitis.  The patient has an erythematous excoriated lesion on the anterior suprapubic region at midline.  There is some skin breakdown, no targetoid lesion, no petechial rash throughout.  Neurological:     Mental Status: He is alert and oriented to person, place, and time.  Psychiatric:        Mood and Affect: Mood normal.        Behavior: Behavior normal.     ED Results / Procedures / Treatments   Labs (all labs  ordered are listed, but only abnormal results are displayed) Labs Reviewed - No data to display  EKG None  Radiology No results found.  Procedures Procedures    Medications Ordered in ED Medications - No data to display  ED Course/ Medical Decision Making/ A&P                             Medical Decision Making  This patient is a 72 y.o. male who presents to the ED for concern of tick bite, possible cellulitis.   Differential diagnoses prior to evaluation: Tickborne illness such as RMSF, babesiosis, Lyme disease, cellulitis, versus other.  Past Medical History / Social History / Additional history: Chart reviewed. Pertinent results include: Overall noncontributory  Physical Exam: Physical exam performed. The pertinent findings include:  Patient with erythematous excoriated lesion on anterior right shoulder without significant surrounding cellulitis.  The patient has an erythematous excoriated lesion on the anterior suprapubic region at midline.  There is some skin breakdown, no targetoid lesion, no petechial rash throughout.   Medications / Treatment: Will place patient on 10-day course of doxycycline to cover for cellulitis as well as any possible tickborne illness although no evidence of RMSF, Lyme disease on my evaluation today.   Disposition: After consideration of the diagnostic results and the patients response to treatment, I feel that patient is stable for discharge with plan as above.   emergency department workup does not suggest an emergent condition requiring admission or immediate intervention beyond what has been performed at this time. The plan is: as above. The patient is safe for discharge and has been instructed to return immediately for worsening symptoms, change in symptoms or any other concerns.  Final Clinical Impression(s) / ED Diagnoses Final diagnoses:  Tick bite of right shoulder, initial encounter  Tick bite of abdomen, initial encounter   Cellulitis of abdominal wall    Rx / DC Orders ED Discharge Orders          Ordered    doxycycline (VIBRAMYCIN) 100 MG capsule  2 times daily        12/05/22 2036              [place] 12/05/22 2040    Mardene Sayer, MD 12/06/22 [phone]

## 2022-12-05 NOTE — Discharge Instructions (Signed)
As we discussed I recommend that you take this antibiotic on a full stomach, and it will make you sensitive to the sun while you are taking it, you may want to consider wearing sunscreen even if you normally would not or avoiding excessive sun exposure while you are taking this antibiotic.

## 2022-12-09 DIAGNOSIS — N1831 Chronic kidney disease, stage 3a: Secondary | ICD-10-CM | POA: Diagnosis not present

## 2022-12-09 DIAGNOSIS — Z7984 Long term (current) use of oral hypoglycemic drugs: Secondary | ICD-10-CM | POA: Diagnosis not present

## 2022-12-09 DIAGNOSIS — E785 Hyperlipidemia, unspecified: Secondary | ICD-10-CM | POA: Diagnosis not present

## 2022-12-09 DIAGNOSIS — E1122 Type 2 diabetes mellitus with diabetic chronic kidney disease: Secondary | ICD-10-CM | POA: Diagnosis not present

## 2022-12-09 DIAGNOSIS — R809 Proteinuria, unspecified: Secondary | ICD-10-CM | POA: Diagnosis not present

## 2022-12-09 DIAGNOSIS — I129 Hypertensive chronic kidney disease with stage 1 through stage 4 chronic kidney disease, or unspecified chronic kidney disease: Secondary | ICD-10-CM | POA: Diagnosis not present

## 2022-12-09 DIAGNOSIS — I10 Essential (primary) hypertension: Secondary | ICD-10-CM | POA: Diagnosis not present

## 2022-12-09 DIAGNOSIS — Z794 Long term (current) use of insulin: Secondary | ICD-10-CM | POA: Diagnosis not present

## 2022-12-09 DIAGNOSIS — Z955 Presence of coronary angioplasty implant and graft: Secondary | ICD-10-CM | POA: Diagnosis not present

## 2022-12-09 DIAGNOSIS — Z0000 Encounter for general adult medical examination without abnormal findings: Secondary | ICD-10-CM | POA: Diagnosis not present

## 2022-12-09 DIAGNOSIS — E1165 Type 2 diabetes mellitus with hyperglycemia: Secondary | ICD-10-CM | POA: Diagnosis not present

## 2022-12-09 DIAGNOSIS — R931 Abnormal findings on diagnostic imaging of heart and coronary circulation: Secondary | ICD-10-CM | POA: Diagnosis not present

## 2022-12-19 ENCOUNTER — Encounter (INDEPENDENT_AMBULATORY_CARE_PROVIDER_SITE_OTHER): Payer: Self-pay | Admitting: *Deleted

## 2022-12-31 ENCOUNTER — Other Ambulatory Visit (HOSPITAL_COMMUNITY): Payer: Self-pay | Admitting: Cardiology

## 2023-01-01 NOTE — Progress Notes (Signed)
Cardiology Office Note    Date:  01/02/2023  ID:  Jose Clark, DOB 17-Nov-1950, MRN [id number] Cardiologist: Dina Rich, MD    History of Present Illness:    Jose Clark is a 72 y.o. male with past medical history of CAD (s/p STEMI in 07/2021 with DES to proximal LAD and RCA was occluded with L--> R collaterals), HFrEF(EF 30-35% in 07/2021 and 10/2021, s/p ICD placement in 01/2022), HTN, HLD, Type 2 DM, Stage 3 CKD and tobacco use who presents to the office today for 67-month follow-up.   He was examined by Dr. Wyline Mood in 06/2022 and denied any recent anginal symptoms at that time. He appeared euvolemic by examination but had been off Aldactone for unclear reasons. This was restarted at 25 mg daily and he was continued on his other cardiac medications at time including ASA 81 mg daily, Atorvastatin 80 mg daily, Jardiance 25 mg daily, Toprol-XL 50 mg daily, Entresto 49-51 mg twice daily and Brilinta 90 mg twice daily. He was informed he could stop Brilinta in 07/2022. He did have follow-up with AHF and also EP in 06/2022 but no-showed for the visits.  In talking with the patient and his daughter today, he reports overall doing well since his last office visit. He reports his breathing has significantly improved since his last office visit and denies any recent dyspnea on exertion, chest pain, palpitations, orthopnea, PND or pitting edema. Reports he did self-discontinue Spironolactone due to having dizziness with positional changes each time he would take this medication.   Studies Reviewed:   EKG: EKG is not ordered today.  Cardiac Catheterization: 07/2021   Prox RCA lesion is 99% stenosed.   Mid RCA to Dist RCA lesion is 100% stenosed.   Ost Cx to Mid Cx lesion is 40% stenosed.   2nd Mrg lesion is 60% stenosed.   Dist Cx lesion is 50% stenosed.   Prox LAD to Mid LAD lesion is 99% stenosed.   Mid LAD lesion is 40% stenosed.   A drug-eluting stent was successfully placed using a  SYNERGY XD [id number].   Post intervention, there is a 0% residual stenosis.   The LAD is a large vessel that courses to the apex. The proximal and mid LAD is calcified. The mid LAD has a focal, severe stenosis. The distal LAD has diffuse mild stenosis The Circumflex is a large caliber vessel with mild heavily calcified proximal stenosis. The obtuse marginal branch is a moderate caliber vessel with moderate ostial stenosis The RCA is a moderate caliber vessel with severe mid stenosis and total occlusion in the distal vessel. The distal RCA branches fill from left to right collaterals.  Elevated LVEDP   Recommendations: He is pain free. Will admit to telemetry unit. Will plan DAPT for one year with ASA/Brilinta. High intensity statin. Echo tomorrow. No beta blocker or Ace-inh today with soft BP. Will give Lasix 40 mg IV tonight. Resume home insulin therapy.   Echocardiogram: 10/2021 IMPRESSIONS     1. Left ventricular ejection fraction, by estimation, is 30 to 35%. The  left ventricle has moderately decreased function. The left ventricle  demonstrates global hypokinesis. The left ventricular internal cavity size  was mildly dilated. Left ventricular  diastolic parameters are consistent with Grade I diastolic dysfunction  (impaired relaxation). The average left ventricular global longitudinal  strain is -13.0 %. The global longitudinal strain is abnormal.   2. Right ventricular systolic function is mildly reduced. The right  ventricular size is  normal. Tricuspid regurgitation signal is inadequate  for assessing PA pressure.   3. The mitral valve is grossly normal. Mild mitral valve regurgitation.   4. The aortic valve is grossly normal. There is mild calcification of the  aortic valve. Aortic valve regurgitation is not visualized.   5. The inferior vena cava is normal in size with greater than 50%  respiratory variability, suggesting right atrial pressure of 3 mmHg.   Comparison(s): No  significant change from prior study.     Physical Exam:   VS:  BP 102/60   Pulse 77   Ht 5\' 11"  (1.803 m)   Wt 187 lb 9.6 oz (85.1 kg)   SpO2 97%   BMI 26.16 kg/m    Wt Readings from Last 3 Encounters:  01/02/23 187 lb 9.6 oz (85.1 kg)  12/05/22 180 lb (81.6 kg)  08/19/22 180 lb (81.6 kg)     GEN: Well nourished, well developed male appearing in no acute distress NECK: No JVD; No carotid bruits CARDIAC: RRR, no murmurs, rubs, gallops RESPIRATORY:  Clear to auscultation without rales, wheezing or rhonchi  ABDOMEN: Appears non-distended. No obvious abdominal masses. EXTREMITIES: No clubbing or cyanosis. No pitting edema.  Distal pedal pulses are 2+ bilaterally.   Assessment and Plan:   1. CAD - He is s/p STEMI in 07/2021 with DES to proximal LAD and RCA was occluded with L--> R collaterals.  - He remains active at baseline and denies any recent anginal symptoms. - Continue current medical therapy with ASA 81 mg daily, Atorvastatin 80 mg daily and Toprol-XL 25 mg daily.  2. Chronic HFrEF - His EF was at 30-35% in 07/2021 and 10/2021. He appears euvolemic by examination today and denies any recent respiratory issues. Given the timeframe since last evaluation, will plan for a follow-up echocardiogram for reassessment of his EF. - Continue current medical therapy with Jardiance (on 25 mg daily per his PCP due to Type II DM), Entresto 49-51 mg twice daily and Toprol-XL 25 mg daily. He is no longer on Spironolactone as he reports significant dizziness with this despite trying it multiple times.  3. ICD in Place - Followed by Dr. Graciela Husbands. His device was functioning normally by device check in 07/2022. He did miss his last visit with EP and we will get this rescheduled.  4. HTN - His blood pressure is well-controlled at 102/60 during today's visit. Continue current medical therapy with Entresto 49-51 mg twice daily and Toprol-XL 25 mg daily.  5. HLD - His LDL was previously at 65 in  08/2021 but was elevated to 90 when checked last month. He reports he had been without Atorvastatin for at least a month prior to this being checked. He has restarted the medication in the interim. Will recheck an FLP and LFT's in 2 to 3 months. If LDL remains above goal, would switch Atorvastatin 80 mg daily to Crestor 40 mg daily.  Signed, Ellsworth Lennox, PA-C

## 2023-01-02 ENCOUNTER — Encounter: Payer: Self-pay | Admitting: Student

## 2023-01-02 ENCOUNTER — Ambulatory Visit: Payer: 59 | Attending: Student | Admitting: Student

## 2023-01-02 VITALS — BP 102/60 | HR 77 | Ht 71.0 in | Wt 187.6 lb

## 2023-01-02 DIAGNOSIS — I10 Essential (primary) hypertension: Secondary | ICD-10-CM

## 2023-01-02 DIAGNOSIS — I5020 Unspecified systolic (congestive) heart failure: Secondary | ICD-10-CM

## 2023-01-02 DIAGNOSIS — E785 Hyperlipidemia, unspecified: Secondary | ICD-10-CM

## 2023-01-02 DIAGNOSIS — Z95810 Presence of automatic (implantable) cardiac defibrillator: Secondary | ICD-10-CM

## 2023-01-02 DIAGNOSIS — I2510 Atherosclerotic heart disease of native coronary artery without angina pectoris: Secondary | ICD-10-CM

## 2023-01-02 MED ORDER — ENTRESTO 49-51 MG PO TABS: 1.0000 | ORAL_TABLET | Freq: Two times a day (BID) | ORAL | 3 refills | Status: DC

## 2023-01-02 MED ORDER — METOPROLOL SUCCINATE ER 25 MG PO TB24: 25.0000 mg | ORAL_TABLET | Freq: Every day | ORAL | 3 refills | Status: DC

## 2023-01-02 MED ORDER — ATORVASTATIN CALCIUM 80 MG PO TABS: 80.0000 mg | ORAL_TABLET | Freq: Every day | ORAL | 3 refills | Status: DC

## 2023-01-02 NOTE — Patient Instructions (Signed)
Medication Instructions:  Your physician recommends that you continue on your current medications as directed. Please refer to the Current Medication list given to you today.  *If you need a refill on your cardiac medications before your next appointment, please call your pharmacy*   Lab Work: Your physician recommends that you return for lab work in: 2 Months at [facility] ( August)   If you have labs (blood work) drawn today and your tests are completely normal, you will receive your results only by: MyChart Message (if you have MyChart) OR A paper copy in the mail If you have any lab test that is abnormal or we need to change your treatment, we will call you to review the results.   Testing/Procedures: Your physician has requested that you have an echocardiogram. Echocardiography is a painless test that uses sound waves to create images of your heart. It provides your doctor with information about the size and shape of your heart and how well your heart's chambers and valves are working. This procedure takes approximately one hour. There are no restrictions for this procedure. Please do NOT wear cologne, perfume, aftershave, or lotions (deodorant is allowed). Please arrive 15 minutes prior to your appointment time.    Follow-Up: At [facility], you and your health needs are our priority.  As part of our continuing mission to provide you with exceptional heart care, we have created designated Provider Care Teams.  These Care Teams include your primary Cardiologist (physician) and Advanced Practice Providers (APPs -  Physician Assistants and Nurse Practitioners) who all work together to provide you with the care you need, when you need it.  We recommend signing up for the patient portal called "MyChart".  Sign up information is provided on this After Visit Summary.  MyChart is used to connect with patients for Virtual Visits (Telemedicine).  Patients are able to view lab/test  results, encounter notes, upcoming appointments, etc.  Non-urgent messages can be sent to your provider as well.   To learn more about what you can do with MyChart, go to [email or web address].    Your next appointment:   6 month(s)  Provider:   Dina Rich, MD    Other Instructions Thank you for choosing [facility]!

## 2023-01-06 ENCOUNTER — Ambulatory Visit (INDEPENDENT_AMBULATORY_CARE_PROVIDER_SITE_OTHER): Payer: 59

## 2023-01-06 DIAGNOSIS — I255 Ischemic cardiomyopathy: Secondary | ICD-10-CM

## 2023-01-06 DIAGNOSIS — I5020 Unspecified systolic (congestive) heart failure: Secondary | ICD-10-CM

## 2023-01-07 LAB — CUP PACEART REMOTE DEVICE CHECK
Battery Remaining Percentage: 91 %
Date Time Interrogation Session: 20240618225700
Implantable Lead Connection Status: 753985
Implantable Lead Implant Date: 20230703
Implantable Lead Location: 753862
Implantable Lead Model: 3501
Implantable Lead Serial Number: 235028
Implantable Pulse Generator Implant Date: 20230703
Pulse Gen Serial Number: 174730

## 2023-01-13 IMAGING — DX DG CHEST 2V
2 series · 2 of 2 positions shown · non-contrast
Comparison: 08/21/2020

CLINICAL DATA: Cough and body aches

EXAM:
CHEST - 2 VIEW

[chest pa]
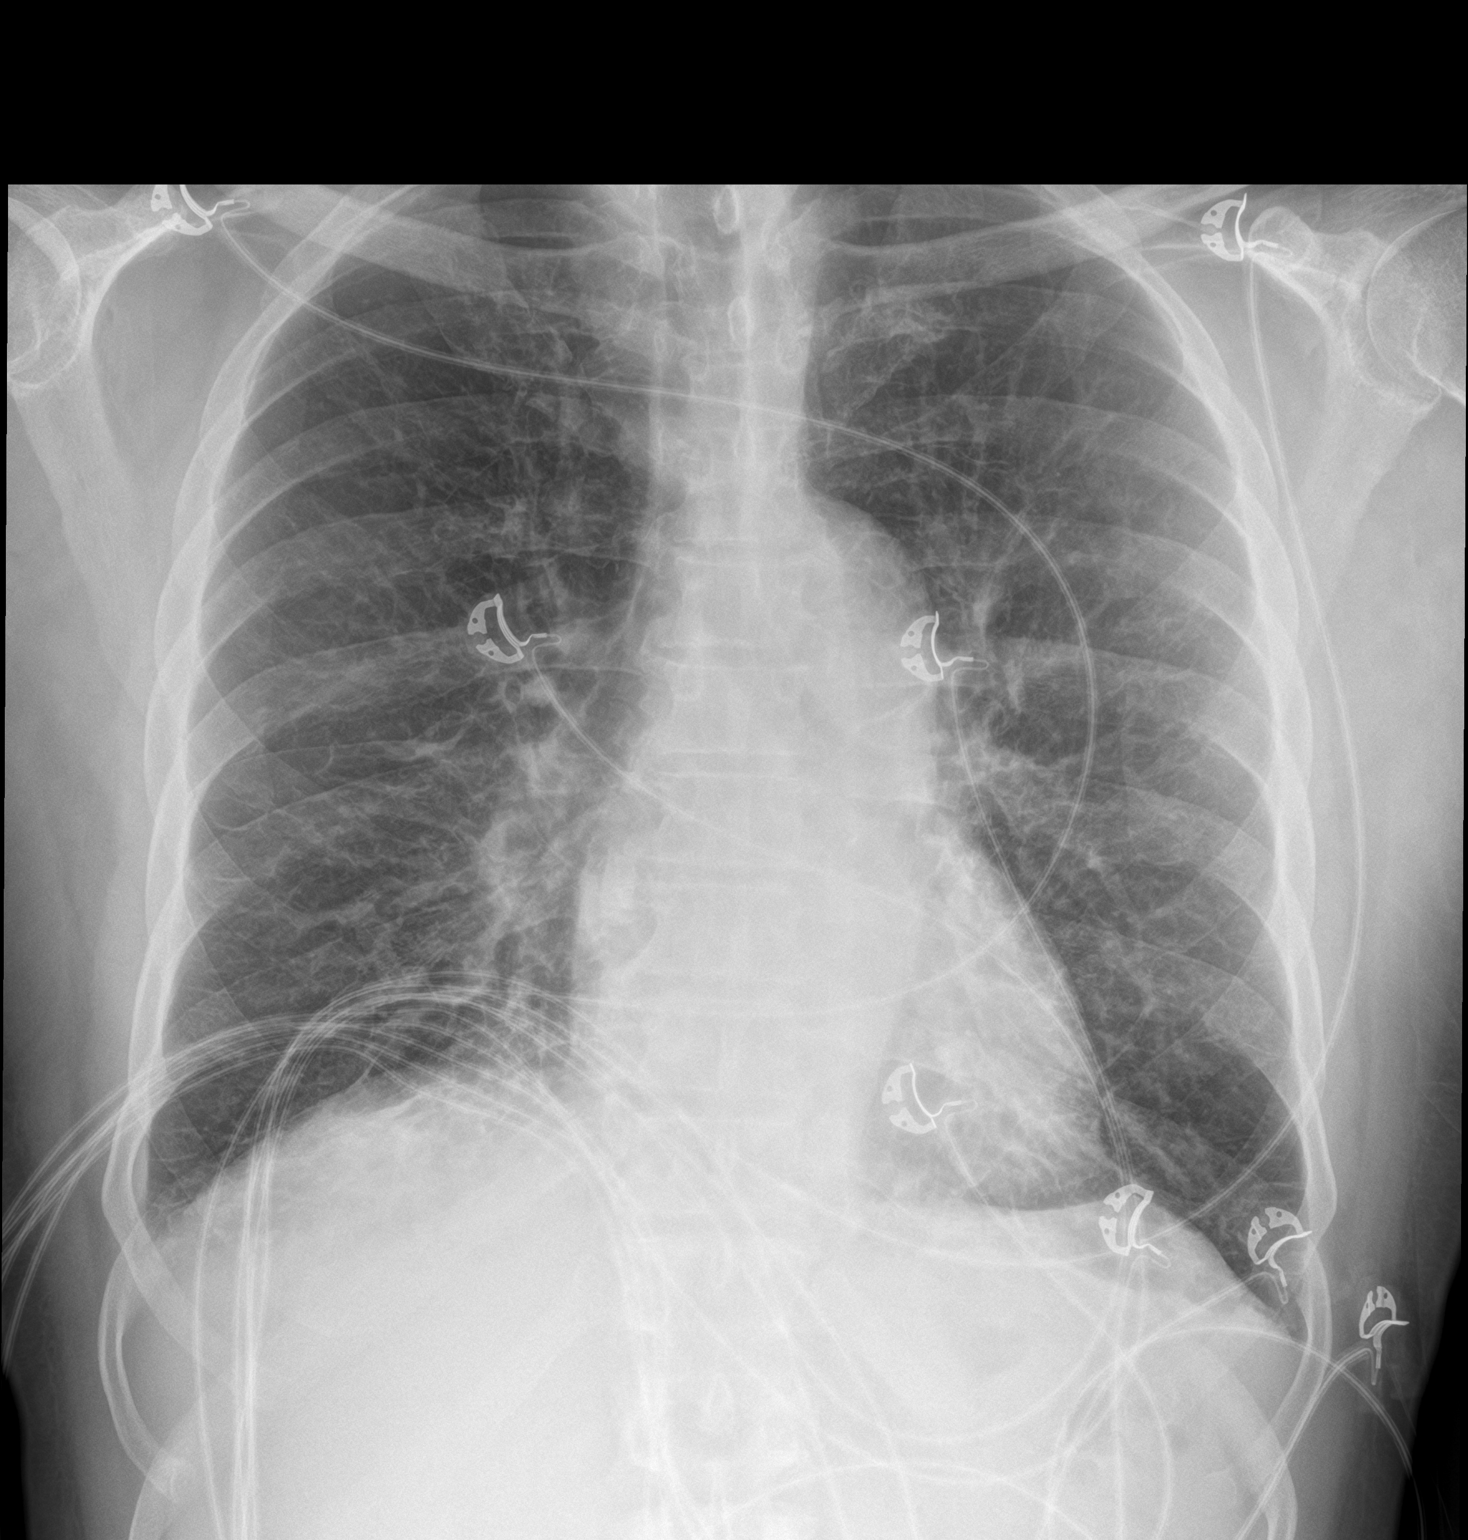

[chest lat]
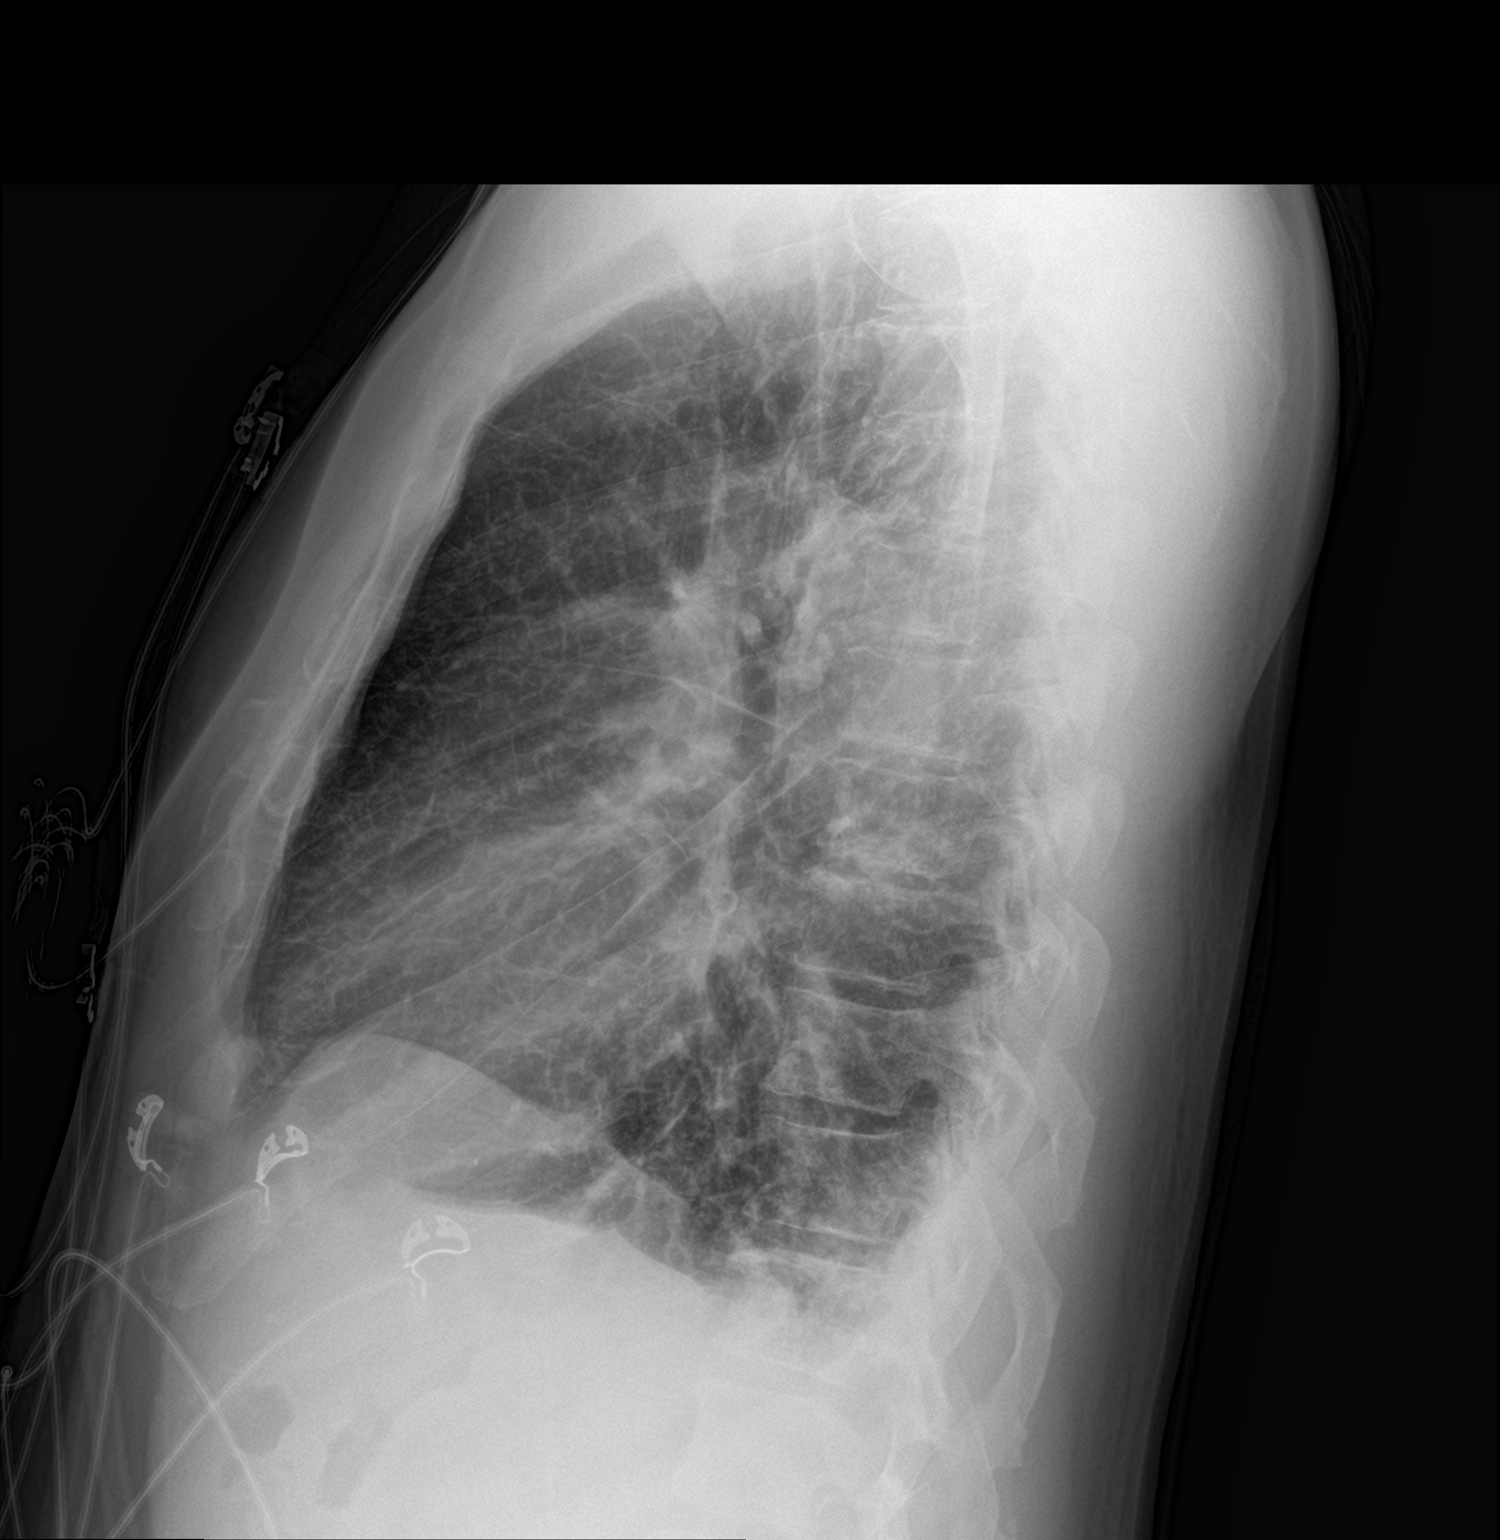

[2 of 2 positions shown; findings below may reference images not displayed]

FINDINGS: Fine interstitial opacities bilaterally. Normal heart size and
mediastinal contours. Trace effusions.
IMPRESSION: Kerley lines usually reflecting interstitial edema. In the setting
of body aches atypical infection is also considered.

## 2023-01-28 NOTE — Progress Notes (Signed)
Remote ICD transmission.   

## 2023-02-17 ENCOUNTER — Ambulatory Visit (HOSPITAL_COMMUNITY)
Admission: RE | Admit: 2023-02-17 | Discharge: 2023-02-17 | Disposition: A | Discharge status: Home / Self Care | Payer: 59 | Source: Ambulatory Visit | Attending: Student | Admitting: Student

## 2023-02-17 DIAGNOSIS — I5020 Unspecified systolic (congestive) heart failure: Secondary | ICD-10-CM | POA: Diagnosis not present

## 2023-02-17 LAB — ECHOCARDIOGRAM LIMITED
Calc EF: 35.9 %
S' Lateral: 4.1 cm
Single Plane A2C EF: 41.6 %
Single Plane A4C EF: 31.4 %

## 2023-02-17 MED ORDER — PERFLUTREN LIPID MICROSPHERE
1.0000 mL | INTRAVENOUS | Status: AC | PRN
  Administered 2023-02-17: 3 mL via INTRAVENOUS

## 2023-02-17 NOTE — Progress Notes (Signed)
*  PRELIMINARY RESULTS* Echocardiogram Limited 2-D Echocardiogram  has been performed with Definity.  Stacey Drain 02/17/2023, 4:05 PM

## 2023-03-18 DIAGNOSIS — E1165 Type 2 diabetes mellitus with hyperglycemia: Secondary | ICD-10-CM | POA: Diagnosis not present

## 2023-03-18 DIAGNOSIS — E785 Hyperlipidemia, unspecified: Secondary | ICD-10-CM | POA: Diagnosis not present

## 2023-03-19 DIAGNOSIS — Z7984 Long term (current) use of oral hypoglycemic drugs: Secondary | ICD-10-CM | POA: Diagnosis not present

## 2023-03-19 DIAGNOSIS — N1831 Chronic kidney disease, stage 3a: Secondary | ICD-10-CM | POA: Diagnosis not present

## 2023-03-19 DIAGNOSIS — Z955 Presence of coronary angioplasty implant and graft: Secondary | ICD-10-CM | POA: Diagnosis not present

## 2023-03-19 DIAGNOSIS — Z0000 Encounter for general adult medical examination without abnormal findings: Secondary | ICD-10-CM | POA: Diagnosis not present

## 2023-03-19 DIAGNOSIS — I10 Essential (primary) hypertension: Secondary | ICD-10-CM | POA: Diagnosis not present

## 2023-03-19 DIAGNOSIS — Z87891 Personal history of nicotine dependence: Secondary | ICD-10-CM | POA: Diagnosis not present

## 2023-03-19 DIAGNOSIS — R809 Proteinuria, unspecified: Secondary | ICD-10-CM | POA: Diagnosis not present

## 2023-03-19 DIAGNOSIS — E1165 Type 2 diabetes mellitus with hyperglycemia: Secondary | ICD-10-CM | POA: Diagnosis not present

## 2023-03-19 DIAGNOSIS — R931 Abnormal findings on diagnostic imaging of heart and coronary circulation: Secondary | ICD-10-CM | POA: Diagnosis not present

## 2023-03-19 DIAGNOSIS — I129 Hypertensive chronic kidney disease with stage 1 through stage 4 chronic kidney disease, or unspecified chronic kidney disease: Secondary | ICD-10-CM | POA: Diagnosis not present

## 2023-03-19 DIAGNOSIS — R7401 Elevation of levels of liver transaminase levels: Secondary | ICD-10-CM | POA: Diagnosis not present

## 2023-03-19 DIAGNOSIS — E785 Hyperlipidemia, unspecified: Secondary | ICD-10-CM | POA: Diagnosis not present

## 2023-04-16 ENCOUNTER — Ambulatory Visit (INDEPENDENT_AMBULATORY_CARE_PROVIDER_SITE_OTHER): Payer: 59

## 2023-04-16 DIAGNOSIS — I255 Ischemic cardiomyopathy: Secondary | ICD-10-CM | POA: Diagnosis not present

## 2023-04-16 LAB — CUP PACEART REMOTE DEVICE CHECK
Battery Remaining Percentage: 87 %
Date Time Interrogation Session: 20240926035100
HighPow Impedance: 65 Ohm
Implantable Lead Connection Status: 753985
Implantable Lead Implant Date: 20230703
Implantable Lead Location: 753862
Implantable Lead Model: 3501
Implantable Lead Serial Number: 235028
Implantable Pulse Generator Implant Date: 20230703
Pulse Gen Serial Number: 174730

## 2023-04-24 NOTE — Progress Notes (Signed)
Remote ICD transmission.   

## 2023-05-29 DIAGNOSIS — Z79899 Other long term (current) drug therapy: Secondary | ICD-10-CM | POA: Diagnosis not present

## 2023-05-29 DIAGNOSIS — M21619 Bunion of unspecified foot: Secondary | ICD-10-CM | POA: Diagnosis not present

## 2023-05-29 DIAGNOSIS — M21611 Bunion of right foot: Secondary | ICD-10-CM | POA: Diagnosis not present

## 2023-05-29 DIAGNOSIS — E1165 Type 2 diabetes mellitus with hyperglycemia: Secondary | ICD-10-CM | POA: Diagnosis not present

## 2023-06-09 ENCOUNTER — Emergency Department (HOSPITAL_COMMUNITY)
Admission: EM | Admit: 2023-06-09 | Discharge: 2023-06-10 | Disposition: A | Discharge status: Home / Self Care | Payer: 59 | Attending: Emergency Medicine | Admitting: Emergency Medicine

## 2023-06-09 ENCOUNTER — Other Ambulatory Visit: Payer: Self-pay

## 2023-06-09 ENCOUNTER — Encounter (HOSPITAL_COMMUNITY): Payer: Self-pay | Admitting: *Deleted

## 2023-06-09 ENCOUNTER — Emergency Department (HOSPITAL_COMMUNITY): Payer: 59

## 2023-06-09 DIAGNOSIS — R1110 Vomiting, unspecified: Secondary | ICD-10-CM | POA: Diagnosis not present

## 2023-06-09 DIAGNOSIS — I6523 Occlusion and stenosis of bilateral carotid arteries: Secondary | ICD-10-CM | POA: Diagnosis not present

## 2023-06-09 DIAGNOSIS — J029 Acute pharyngitis, unspecified: Secondary | ICD-10-CM | POA: Diagnosis not present

## 2023-06-09 DIAGNOSIS — Z794 Long term (current) use of insulin: Secondary | ICD-10-CM | POA: Diagnosis not present

## 2023-06-09 DIAGNOSIS — I509 Heart failure, unspecified: Secondary | ICD-10-CM | POA: Diagnosis not present

## 2023-06-09 DIAGNOSIS — R079 Chest pain, unspecified: Secondary | ICD-10-CM | POA: Diagnosis not present

## 2023-06-09 DIAGNOSIS — Z7982 Long term (current) use of aspirin: Secondary | ICD-10-CM | POA: Insufficient documentation

## 2023-06-09 DIAGNOSIS — E119 Type 2 diabetes mellitus without complications: Secondary | ICD-10-CM | POA: Diagnosis not present

## 2023-06-09 DIAGNOSIS — R112 Nausea with vomiting, unspecified: Secondary | ICD-10-CM | POA: Diagnosis not present

## 2023-06-09 DIAGNOSIS — Z7984 Long term (current) use of oral hypoglycemic drugs: Secondary | ICD-10-CM | POA: Diagnosis not present

## 2023-06-09 DIAGNOSIS — K5730 Diverticulosis of large intestine without perforation or abscess without bleeding: Secondary | ICD-10-CM | POA: Diagnosis not present

## 2023-06-09 DIAGNOSIS — I672 Cerebral atherosclerosis: Secondary | ICD-10-CM | POA: Diagnosis not present

## 2023-06-09 DIAGNOSIS — K922 Gastrointestinal hemorrhage, unspecified: Secondary | ICD-10-CM | POA: Diagnosis not present

## 2023-06-09 DIAGNOSIS — K5790 Diverticulosis of intestine, part unspecified, without perforation or abscess without bleeding: Secondary | ICD-10-CM | POA: Diagnosis not present

## 2023-06-09 DIAGNOSIS — I10 Essential (primary) hypertension: Secondary | ICD-10-CM | POA: Diagnosis not present

## 2023-06-09 DIAGNOSIS — R066 Hiccough: Secondary | ICD-10-CM | POA: Diagnosis not present

## 2023-06-09 DIAGNOSIS — R935 Abnormal findings on diagnostic imaging of other abdominal regions, including retroperitoneum: Secondary | ICD-10-CM | POA: Diagnosis not present

## 2023-06-09 LAB — CBC WITH DIFFERENTIAL/PLATELET
Abs Immature Granulocytes: 0.03 10*3/uL (ref 0.00–0.07)
Basophils Absolute: 0 10*3/uL (ref 0.0–0.1)
Basophils Relative: 0 %
Eosinophils Absolute: 0 10*3/uL (ref 0.0–0.5)
Eosinophils Relative: 0 %
HCT: 46.6 % (ref 39.0–52.0)
Hemoglobin: 16 g/dL (ref 13.0–17.0)
Immature Granulocytes: 0 %
Lymphocytes Relative: 27 %
Lymphs Abs: 2.4 10*3/uL (ref 0.7–4.0)
MCH: 31.9 pg (ref 26.0–34.0)
MCHC: 34.3 g/dL (ref 30.0–36.0)
MCV: 92.8 fL (ref 80.0–100.0)
Monocytes Absolute: 0.9 10*3/uL (ref 0.1–1.0)
Monocytes Relative: 10 %
Neutro Abs: 5.5 10*3/uL (ref 1.7–7.7)
Neutrophils Relative %: 63 %
Platelets: 177 10*3/uL (ref 150–400)
RBC: 5.02 MIL/uL (ref 4.22–5.81)
RDW: 11.9 % (ref 11.5–15.5)
WBC: 9 10*3/uL (ref 4.0–10.5)
nRBC: 0 % (ref 0.0–0.2)

## 2023-06-09 LAB — TROPONIN I (HIGH SENSITIVITY)
Troponin I (High Sensitivity): 21 ng/L — ABNORMAL HIGH (ref <18)
Troponin I (High Sensitivity): 21 ng/L — ABNORMAL HIGH (ref <18)

## 2023-06-09 LAB — COMPREHENSIVE METABOLIC PANEL
ALT: 35 U/L (ref 0–44)
AST: 35 U/L (ref 15–41)
Albumin: 3.3 g/dL — ABNORMAL LOW (ref 3.5–5.0)
Alkaline Phosphatase: 78 U/L (ref 38–126)
Anion gap: 8 (ref 5–15)
BUN: 37 mg/dL — ABNORMAL HIGH (ref 8–23)
CO2: 23 mmol/L (ref 22–32)
Calcium: 9.1 mg/dL (ref 8.9–10.3)
Chloride: 99 mmol/L (ref 98–111)
Creatinine, Ser: 1.22 mg/dL (ref 0.61–1.24)
GFR, Estimated: >60 mL/min (ref >60)
Glucose, Bld: 258 mg/dL — ABNORMAL HIGH (ref 70–99)
Potassium: 4.1 mmol/L (ref 3.5–5.1)
Sodium: 130 mmol/L — ABNORMAL LOW (ref 135–145)
Total Bilirubin: 1.2 mg/dL — ABNORMAL HIGH (ref <1.2)
Total Protein: 7 g/dL (ref 6.5–8.1)

## 2023-06-09 LAB — TYPE AND SCREEN
ABO/RH(D): O POS
Antibody Screen: NEGATIVE

## 2023-06-09 LAB — BRAIN NATRIURETIC PEPTIDE: B Natriuretic Peptide: 291 pg/mL — ABNORMAL HIGH (ref 0.0–100.0)

## 2023-06-09 MED ORDER — METOCLOPRAMIDE HCL 5 MG/ML IJ SOLN
10.0000 mg | Freq: Once | INTRAMUSCULAR | Status: AC
Start: 2023-06-09 — End: 2023-06-09
  Administered 2023-06-09: 10 mg via INTRAVENOUS
  Filled 2023-06-09: qty 2

## 2023-06-09 MED ORDER — PANTOPRAZOLE SODIUM 40 MG IV SOLR
40.0000 mg | Freq: Once | INTRAVENOUS | Status: AC
  Administered 2023-06-09: 40 mg via INTRAVENOUS
  Filled 2023-06-09: qty 10

## 2023-06-09 MED ORDER — IOHEXOL 350 MG/ML SOLN
100.0000 mL | Freq: Once | INTRAVENOUS | Status: AC | PRN
  Administered 2023-06-09: 100 mL via INTRAVENOUS

## 2023-06-09 MED ORDER — ONDANSETRON HCL 4 MG PO TABS: 4.0000 mg | ORAL_TABLET | Freq: Four times a day (QID) | ORAL | 0 refills | Status: DC

## 2023-06-09 MED ORDER — OMEPRAZOLE 20 MG PO CPDR: 20.0000 mg | DELAYED_RELEASE_CAPSULE | Freq: Two times a day (BID) | ORAL | 0 refills | Status: DC

## 2023-06-09 MED ORDER — ONDANSETRON HCL 4 MG/2ML IJ SOLN
4.0000 mg | Freq: Once | INTRAMUSCULAR | Status: AC
  Administered 2023-06-09: 4 mg via INTRAVENOUS
  Filled 2023-06-09: qty 2

## 2023-06-09 NOTE — ED Provider Notes (Signed)
[place] EMERGENCY DEPARTMENT AT [facility] Provider Note   CSN: [id number] Arrival date & time: 06/09/23  1516     History  Chief Complaint  Patient presents with   Emesis    Jose Clark is a 72 y.o. male.  With a past medical history of heart failure, ischemic cardiomyopathy, NSTEMI and type 2 diabetes who presents to the ED for vomiting.  Patient has experienced ongoing nausea and vomiting along with persistent hiccups for the last 4 days.  He had 1 episode of black-colored emesis at home earlier today.  He notes some pain in his throat as well as in the substernal region.  Some associated shortness of breath as well.  No fevers, chills or recent illness.  Has been able to tolerate p.o. liquids at home.  Takes 81 aspirin daily.  No anticoagulation.   Emesis      Home Medications Prior to Admission medications   Medication Sig Start Date End Date Taking? Authorizing Provider  omeprazole (PRILOSEC) 20 MG capsule Take 1 capsule (20 mg total) by mouth 2 (two) times daily before a meal. 06/09/23  Yes Estelle June A, DO  ondansetron (ZOFRAN) 4 MG tablet Take 1 tablet (4 mg total) by mouth every 6 (six) hours. 06/09/23  Yes Royanne Foots, DO  albuterol (VENTOLIN HFA) 108 (90 Base) MCG/ACT inhaler Inhale 2 puffs into the lungs every 6 (six) hours as needed for wheezing or shortness of breath (cough). Patient not taking: Reported on 01/02/2023 08/19/22   Shon Hale, MD  aspirin EC 81 MG tablet Take 1 tablet (81 mg total) by mouth daily with breakfast. 08/19/22 08/19/23  Shon Hale, MD  atorvastatin (LIPITOR) 80 MG tablet Take 1 tablet (80 mg total) by mouth daily. 01/02/23   Strader, Lennart Pall, PA-C  empagliflozin (JARDIANCE) 25 MG TABS tablet Take 25 mg by mouth daily.    [provider]  insulin glargine (LANTUS) 100 unit/mL SOPN Inject 38 Units into the skin at bedtime. 08/19/22   Shon Hale, MD  insulin NPH-insulin regular (NOVOLIN 70/30)  (70-30) 100 UNIT/ML injection Inject 10-25 Units into the skin daily as needed (CBG >180).    [provider]  metoprolol succinate (TOPROL-XL) 25 MG 24 hr tablet Take 1 tablet (25 mg total) by mouth daily. 01/02/23   Strader, Lennart Pall, PA-C  nitroGLYCERIN (NITROSTAT) 0.4 MG SL tablet Place 1 tablet (0.4 mg total) under the tongue every 5 (five) minutes as needed. 08/06/21   Bhagat, Bhavinkumar, PA  sacubitril-valsartan (ENTRESTO) 49-51 MG Take 1 tablet by mouth 2 (two) times daily. NEEDS FOLLOW UP APPOINTMENT FOR MORE REFILLS 01/02/23   Ellsworth Lennox, PA-C      Allergies    Patient has no known allergies.    Review of Systems   Review of Systems  Gastrointestinal:  Positive for vomiting.    Physical Exam Updated Vital Signs BP 127/81   Pulse 88   Temp 98.4 F (36.9 C)   Resp 15   Ht 5\' 11"  (1.803 m)   Wt 81.6 kg   SpO2 92%   BMI 25.10 kg/m  Physical Exam Vitals and nursing note reviewed.  Constitutional:      Comments: Persistent hiccuping on exam  HENT:     Head: Normocephalic and atraumatic.  Eyes:     Pupils: Pupils are equal, round, and reactive to light.  Cardiovascular:     Rate and Rhythm: Normal rate and regular rhythm.  Pulmonary:  Effort: Pulmonary effort is normal.     Breath sounds: Normal breath sounds.  Abdominal:     Palpations: Abdomen is soft.     Tenderness: There is no abdominal tenderness.  Skin:    General: Skin is warm and dry.  Neurological:     General: No focal deficit present.     Mental Status: He is alert.     Cranial Nerves: No cranial nerve deficit.     Sensory: No sensory deficit.     Motor: No weakness.  Psychiatric:        Mood and Affect: Mood normal.     ED Results / Procedures / Treatments   Labs (all labs ordered are listed, but only abnormal results are displayed) Labs Reviewed  COMPREHENSIVE METABOLIC PANEL - Abnormal; Notable for the following components:      Result Value   Sodium 130 (*)     Glucose, Bld 258 (*)    BUN 37 (*)    Albumin 3.3 (*)    Total Bilirubin 1.2 (*)    All other components within normal limits  BRAIN NATRIURETIC PEPTIDE - Abnormal; Notable for the following components:   B Natriuretic Peptide 291.0 (*)    All other components within normal limits  TROPONIN I (HIGH SENSITIVITY) - Abnormal; Notable for the following components:   Troponin I (High Sensitivity) 21 (*)    All other components within normal limits  TROPONIN I (HIGH SENSITIVITY) - Abnormal; Notable for the following components:   Troponin I (High Sensitivity) 21 (*)    All other components within normal limits  CBC WITH DIFFERENTIAL/PLATELET  TYPE AND SCREEN    EKG None  Radiology CT ABDOMEN PELVIS W CONTRAST  Result Date: 06/09/2023 CLINICAL DATA:  Nausea and vomiting for several days, initial encounter EXAM: CT ABDOMEN AND PELVIS WITH CONTRAST TECHNIQUE: Multidetector CT imaging of the abdomen and pelvis was performed using the standard protocol following bolus administration of intravenous contrast. RADIATION DOSE REDUCTION: This exam was performed according to the departmental dose-optimization program which includes automated exposure control, adjustment of the mA and/or kV according to patient size and/or use of iterative reconstruction technique. CONTRAST:  OMNIPAQUE IOHEXOL 350 MG/ML SOLN COMPARISON:  None Available. FINDINGS: Lower chest: Lung bases are free of acute infiltrate or sizable effusion. Anterior defibrillator is noted. Hepatobiliary: No focal liver abnormality is seen. No gallstones, gallbladder wall thickening, or biliary dilatation. Pancreas: Unremarkable. No pancreatic ductal dilatation or surrounding inflammatory changes. Spleen: Normal in size without focal abnormality. Adrenals/Urinary Tract: Adrenal glands are well visualized. A left adrenal lesion is noted measuring 13 mm. This measures 120 Hounsfield units. This likely represents an adrenal adenoma. Kidneys  demonstrate a normal enhancement pattern bilaterally. No renal calculi are seen. No obstructive changes are noted. The bladder is well distended. Stomach/Bowel: Scattered diverticular change of the colon is noted. No evidence of diverticulitis is seen. The appendix is within normal limits. Small bowel and stomach are unremarkable. Vascular/Lymphatic: Aortic atherosclerosis. No enlarged abdominal or pelvic lymph nodes. Reproductive: Prostate is unremarkable. Other: No abdominal wall hernia or abnormality. No abdominopelvic ascites. Musculoskeletal: No acute or significant osseous findings. IMPRESSION: Diverticulosis without diverticulitis. 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 Recommend follow-up adrenal washout CT in 1 year. If stable for = 1 year, no further follow-up imaging. JACR 2017 Aug; 14(8):1038-44, JCAT 2016 Mar-Apr; 40(2):194-200, Urol J 2006 Spring; 3(2):71-4. Electronically Signed   By: Alcide Clever M.D.   On: 06/09/2023 22:05  CT ANGIO HEAD NECK W WO CM  Result Date: 06/09/2023 CLINICAL DATA:  Vomiting since Saturday, chest pain and sore throat, hiccups EXAM: CT ANGIOGRAPHY HEAD AND NECK TECHNIQUE: Multidetector CT imaging of the head and neck was performed using the standard protocol during bolus administration of intravenous contrast. Multiplanar CT image reconstructions and MIPs were obtained to evaluate the vascular anatomy. Carotid stenosis measurements (when applicable) are obtained utilizing NASCET criteria, using the distal internal carotid diameter as the denominator. RADIATION DOSE REDUCTION: This exam was performed according to the departmental dose-optimization program which includes automated exposure control, adjustment of the mA and/or kV according to patient size and/or use of iterative reconstruction technique. CONTRAST:  OMNIPAQUE IOHEXOL 350 MG/ML SOLN COMPARISON:  None Available. FINDINGS: CT HEAD FINDINGS Brain: No evidence of acute infarct,  hemorrhage, mass, mass effect, or midline shift. No hydrocephalus or extra-axial fluid collection. Periventricular white matter changes, likely the sequela of chronic small vessel ischemic disease. Vascular: No hyperdense vessel. Atherosclerotic calcifications in the intracranial carotid and vertebral arteries. Skull: Negative for fracture or focal lesion. Sinuses/Orbits: Mucosal thickening in the left maxillary sinus. Status post bilateral lens replacements. Other: The mastoid air cells are well aerated. CTA NECK FINDINGS Aortic arch: Two-vessel arch with a common origin of the brachiocephalic and left common carotid arteries. Imaged portion shows no evidence of aneurysm or dissection. No significant stenosis of the major arch vessel origins. Mild aortic atherosclerosis. Right carotid system: Calcified and noncalcified plaque at the proximal right ICA causes up to 70% stenosis (series 7, image 213). No evidence of dissection. Left carotid system: No evidence of dissection, occlusion, or hemodynamically significant stenosis (greater than 50%). Atherosclerotic disease in the common carotid, at the bifurcation, and in the proximal ICA is not hemodynamically significant. Vertebral arteries: No evidence of dissection, occlusion, or hemodynamically significant stenosis (greater than 50%). Skeleton: No acute osseous abnormality. Degenerative changes in the cervical spine. Edentulous. Other neck: No acute finding. Upper chest: No focal pulmonary opacity or pleural effusion. Review of the MIP images confirms the above findings CTA HEAD FINDINGS Anterior circulation: Both internal carotid arteries are patent to the termini, with severe stenosis in the right cavernous ICA, moderate stenosis in the left cavernous ICA, and mild stenosis in the right supraclinoid ICA. A1 segments patent, hypoplastic on the right. Normal anterior communicating artery. Anterior cerebral arteries are patent to their distal aspects without  significant stenosis. No M1 stenosis or occlusion. MCA branches perfused to their distal aspects without significant stenosis. Posterior circulation: Vertebral arteries patent to the vertebrobasilar junction, with moderate stenosis in the mid left V4 and distal right V4. Basilar patent to its distal aspect without significant stenosis. Superior cerebellar arteries patent proximally. Patent P1 segments. PCAs perfused to their distal aspects without significant stenosis. The right posterior communicating artery is patent. Venous sinuses: As permitted by contrast timing, patent. Anatomic variants: None significant. No evidence of aneurysm or vascular malformation. Review of the MIP images confirms the above findings IMPRESSION: 1. No acute intracranial process. 2. 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 3. No intracranial large vessel occlusion. Severe stenosis in the right cavernous ICA, moderate stenosis in the left cavernous ICA, and mild stenosis in the right supraclinoid ICA. 4. Moderate stenosis in the mid left V4 and distal right V4. 5. Aortic atherosclerosis. Aortic Atherosclerosis (ICD10-I70.0). Electronically Signed   By: Wiliam Ke M.D.   On: 06/09/2023 20:57   DG Chest Portable 1 View  Result Date: 06/09/2023 CLINICAL DATA:  Upper  chest pain, hiccups, vomiting. EXAM: PORTABLE CHEST 1 VIEW COMPARISON:  08/19/2022 FINDINGS: Left anterior to fibrillator again noted, unchanged. Heart and mediastinal contours are within normal limits. Increased markings in the lung bases felt to be related to positioning with severe AP lordotic positioning. No definite confluent opacities or effusions. IMPRESSION: No definite acute process.  Somewhat limited by positioning. Electronically Signed   By: Charlett Nose M.D.   On: 06/09/2023 20:46    Procedures Procedures    Medications Ordered in ED Medications  ondansetron (ZOFRAN) injection 4 mg (has no administration in time range)  pantoprazole (PROTONIX)  injection 40 mg (40 mg Intravenous Given 06/09/23 1806)  metoCLOPramide (REGLAN) injection 10 mg (10 mg Intravenous Given 06/09/23 1807)  iohexol (OMNIPAQUE) 350 MG/ML injection 100 mL (100 mLs Intravenous Contrast Given 06/09/23 1743)    ED Course/ Medical Decision Making/ A&P Clinical Course as of 06/09/23 2344  Tue Jun 09, 2023  2320 Laboratory workup notable for elevation in BUN of 37 consistent with upper GI bleeding.  No drop in H&H and no leukocytosis.  Patient hemodynamically stable.  Reports improvement in his hiccups after Reglan and Protonix.    CT a of head and neck shows stenosis of the right carotid artery but no evidence of acute stroke or large vessel occlusion.  CT abdomen pelvis shows diverticulosis.  Incidental adrenal mass most likely adenoma.  Discussed with Dr.castaneda (GI) who will plan for outpatient follow-up with plan for outpatient endoscopy next week.  Given the patient is hemodynamically stable not requiring blood transfusion no need for admission at this time.  Will start on PPI twice daily and instruct for GI follow-up to schedule outpatient endoscopy [MP]    Clinical Course User Index [MP] Royanne Foots, DO                                 Medical Decision Making 73 year old male with history as above presenting for nausea vomiting and hiccuping x 4 days.  1 episode of black emesis at home.  Hemodynamically stable.  Actively hiccuping on my exam.  Reports throat pain and chest discomfort as well.    Will evaluate for ACS with high sensitive troponin and EKG. Presentation concerning for potential GI bleeding.  No anticoagulation.  Will provide Protonix and trial Reglan to help with the hiccuping Persistent hiccuping can be atypical presentation of CVA.  Will obtain CT head and CTA head and neck along with CT abdomen pelvis to evaluate for intra-abdominal causes.  Will obtain type and screen here in the event he should require blood transfusion.  Amount  and/or Complexity of Data Reviewed Labs: ordered. Radiology: ordered.  Risk Prescription drug management.           Final Clinical Impression(s) / ED Diagnoses Final diagnoses:  Upper GI bleed  Diverticulosis  Vomiting, unspecified vomiting type, unspecified whether nausea present    Rx / DC Orders ED Discharge Orders          Ordered    omeprazole (PRILOSEC) 20 MG capsule  2 times daily before meals        06/09/23 2336    ondansetron (ZOFRAN) 4 MG tablet  Every 6 hours        06/09/23 2337              Royanne Foots, DO 06/09/23 2345

## 2023-06-09 NOTE — ED Triage Notes (Signed)
Pt has been vomiting since Saturday; pt c/o upper chest pain and sore throat; pt has had hiccups since last night  Pt has had some episodes of black emesis

## 2023-06-09 NOTE — Discharge Instructions (Addendum)
You were seen in the Emergency Department for dark vomiting and hiccups The CAT scan taken of your head showed a partial blockage in 1 artery but no evidence of stroke A CAT scan taken of your belly showed diverticulosis as well as a growth on the adrenal gland that will need to be followed in the future Your blood work was consistent with bleeding but did not show any severe blood loss It is important that you follow-up with Dr.castaneda in GI (gastroenterology) to have an outpatient endoscopy performed hopefully next week.  Please call the number listed to set up an appointment and schedule time for the endoscopy Please pick up the prescribed medication (omeprazole) from your pharmacy and take twice a day as directed Also take Zofran as directed for nausea and vomiting at home Return to the Emergency Department for severe pain, worsening bleeding or any other concerns

## 2023-06-10 ENCOUNTER — Telehealth (INDEPENDENT_AMBULATORY_CARE_PROVIDER_SITE_OTHER): Payer: Self-pay | Admitting: Gastroenterology

## 2023-06-10 NOTE — Telephone Encounter (Signed)
Hi Ervine Witucki, Can we add this patient for egd on Wednesday (emergency slot) . Dx: coffee ground emesis Thanks  Tue 11:23 PM today or next Wednesday?  7 mins Dolores Frame, MD if he can come todeay would be great, but I doubt he will be npo  DC next Wednesday may be more feasible, but please give him a call and ask him which dfay works better  2 mins Will do :)  1 min DC Dolores Frame, MD I think next week, he is also on jardiance  1 min oh yeap...gotcha  1 min DC Dolores Frame, MD thx  1 min no problem   Placed pt on for 06/17/23 at 2:45pm with Dr.Castaneda. left message for pt to return call so I can go over instructions.   Notification or Prior Authorization is not required for the requested services You are not required to submit a notification/prior authorization based on the information provided. If you have general questions about the prior authorization requirements, visit [email or web address] > Clinician Resources > Advance and Admission Notification Requirements. The number above acknowledges your notification. Please write this reference number down for future reference. If you would like to request an organization determination, please call us at [phone]. Decision ID #: [id number]

## 2023-06-11 NOTE — Telephone Encounter (Signed)
Pt daughter returned call and verbalized understanding. Advised pt that we have placed pt on for next Wednesday. Daughter verbalized understanding. Daughter will come by office and pick up instructions.

## 2023-06-11 NOTE — Telephone Encounter (Signed)
Attempted to reach patient but voicemail is full;unable to leave message. Contacted Shameka (emergency contact) and informed her I was trying to reach patient. Shameka (daughter)states pt may be at work; advised her I am trying to reach him in order to go over instructions for EGD for next week. Daughter will relay message to call me. Aldean Ast my direct number.

## 2023-06-17 ENCOUNTER — Ambulatory Visit (HOSPITAL_COMMUNITY)
Admission: RE | Admit: 2023-06-17 | Discharge: 2023-06-17 | Disposition: A | Discharge status: Home / Self Care | Payer: 59 | Source: Home / Self Care | Attending: Gastroenterology | Admitting: Gastroenterology

## 2023-06-17 ENCOUNTER — Ambulatory Visit (HOSPITAL_COMMUNITY): Payer: 59 | Admitting: Anesthesiology

## 2023-06-17 ENCOUNTER — Encounter (HOSPITAL_COMMUNITY)
Admission: RE | Disposition: A | Discharge status: Home / Self Care | Payer: Self-pay | Source: Home / Self Care | Attending: Gastroenterology

## 2023-06-17 ENCOUNTER — Encounter (HOSPITAL_COMMUNITY): Payer: Self-pay | Admitting: Gastroenterology

## 2023-06-17 DIAGNOSIS — E119 Type 2 diabetes mellitus without complications: Secondary | ICD-10-CM

## 2023-06-17 HISTORY — DX: Presence of automatic (implantable) cardiac defibrillator: Z95.810

## 2023-06-17 SURGERY — ESOPHAGOGASTRODUODENOSCOPY (EGD) WITH PROPOFOL
Anesthesia: General

## 2023-06-17 MED ORDER — LIDOCAINE HCL (PF) 2 % IJ SOLN
INTRAMUSCULAR | Status: AC
  Filled 2023-06-17: qty 5

## 2023-06-17 MED ORDER — PROPOFOL 500 MG/50ML IV EMUL
INTRAVENOUS | Status: AC
  Filled 2023-06-17: qty 50

## 2023-06-17 NOTE — Anesthesia Preprocedure Evaluation (Signed)
Anesthesia Evaluation  Patient identified by MRN, date of birth, ID band Patient awake    Reviewed: Allergy & Precautions, NPO status , Patient's Chart, lab work & pertinent test results  Airway Mallampati: I  TM Distance: >3 FB Neck ROM: Full    Dental  (+) Edentulous Upper, Edentulous Lower   Pulmonary neg pulmonary ROS, former smoker   Pulmonary exam normal breath sounds clear to auscultation       Cardiovascular Exercise Tolerance: Poor hypertension, Pt. on home beta blockers and Pt. on medications + CAD, + Past MI and +CHF  Normal cardiovascular exam+ Cardiac Defibrillator + Valvular Problems/Murmurs MR  Rhythm:Regular Rate:Normal  Echo: 1. Left ventricular ejection fraction, by estimation, is 30 to 35%. The  left ventricle has moderately decreased function. The left ventricle  demonstrates global hypokinesis. The left ventricular internal cavity size  was mildly dilated. Left ventricular  diastolic parameters are consistent with Grade I diastolic dysfunction  (impaired relaxation). The average left ventricular global longitudinal  strain is -13.0 %. The global longitudinal strain is abnormal.  2. Right ventricular systolic function is mildly reduced. The right  ventricular size is normal. Tricuspid regurgitation signal is inadequate  for assessing PA pressure.  3. The mitral valve is grossly normal. Mild mitral valve regurgitation.  4. The aortic valve is grossly normal. There is mild calcification of the  aortic valve. Aortic valve regurgitation is not visualized.  5. The inferior vena cava is normal in size with greater than 50%  respiratory variability, suggesting right atrial pressure of 3 mmHg.    Neuro/Psych negative neurological ROS  negative psych ROS   GI/Hepatic negative GI ROS,,,(+) Hepatitis -, C  Endo/Other  diabetes, Poorly Controlled, Type 2    Renal/GU CRFRenal diseaseStage 3 a CKD  negative  genitourinary   Musculoskeletal negative musculoskeletal ROS (+)    Abdominal Normal abdominal exam  (+)   Peds  Hematology negative hematology ROS (+)   Anesthesia Other Findings   Reproductive/Obstetrics                             Lab Results  Component Value Date   WBC 9.0 06/09/2023   HGB 16.0 06/09/2023   HCT 46.6 06/09/2023   MCV 92.8 06/09/2023   PLT 177 06/09/2023   Lab Results  Component Value Date   CREATININE 1.22 06/09/2023   BUN 37 (H) 06/09/2023   NA 130 (L) 06/09/2023   K 4.1 06/09/2023   CL 99 06/09/2023   CO2 23 06/09/2023    Anesthesia Physical Anesthesia Plan  ASA: 4  Anesthesia Plan: General   Post-op Pain Management: Tylenol PO (pre-op)* and Minimal or no pain anticipated   Induction: Intravenous  PONV Risk Score and Plan: 2 and Propofol infusion  Airway Management Planned: Nasal Cannula and Natural Airway  Additional Equipment: None  Intra-op Plan:   Post-operative Plan:   Informed Consent: I have reviewed the patients History and Physical, chart, labs and discussed the procedure including the risks, benefits and alternatives for the proposed anesthesia with the patient or authorized representative who has indicated his/her understanding and acceptance.     Dental advisory given  Plan Discussed with: CRNA  Anesthesia Plan Comments:         Anesthesia Quick Evaluation

## 2023-07-01 DIAGNOSIS — E1165 Type 2 diabetes mellitus with hyperglycemia: Secondary | ICD-10-CM | POA: Diagnosis not present

## 2023-07-01 DIAGNOSIS — E785 Hyperlipidemia, unspecified: Secondary | ICD-10-CM | POA: Diagnosis not present

## 2023-07-13 DIAGNOSIS — Z794 Long term (current) use of insulin: Secondary | ICD-10-CM | POA: Diagnosis not present

## 2023-07-13 DIAGNOSIS — Z7984 Long term (current) use of oral hypoglycemic drugs: Secondary | ICD-10-CM | POA: Diagnosis not present

## 2023-07-13 DIAGNOSIS — N1831 Chronic kidney disease, stage 3a: Secondary | ICD-10-CM | POA: Diagnosis not present

## 2023-07-13 DIAGNOSIS — I10 Essential (primary) hypertension: Secondary | ICD-10-CM | POA: Diagnosis not present

## 2023-07-13 DIAGNOSIS — Z79899 Other long term (current) drug therapy: Secondary | ICD-10-CM | POA: Diagnosis not present

## 2023-07-13 DIAGNOSIS — E785 Hyperlipidemia, unspecified: Secondary | ICD-10-CM | POA: Diagnosis not present

## 2023-07-13 DIAGNOSIS — M21619 Bunion of unspecified foot: Secondary | ICD-10-CM | POA: Diagnosis not present

## 2023-07-13 DIAGNOSIS — Z87891 Personal history of nicotine dependence: Secondary | ICD-10-CM | POA: Diagnosis not present

## 2023-07-13 DIAGNOSIS — I129 Hypertensive chronic kidney disease with stage 1 through stage 4 chronic kidney disease, or unspecified chronic kidney disease: Secondary | ICD-10-CM | POA: Diagnosis not present

## 2023-07-13 DIAGNOSIS — E1165 Type 2 diabetes mellitus with hyperglycemia: Secondary | ICD-10-CM | POA: Diagnosis not present

## 2023-07-13 DIAGNOSIS — E1122 Type 2 diabetes mellitus with diabetic chronic kidney disease: Secondary | ICD-10-CM | POA: Diagnosis not present

## 2023-07-13 DIAGNOSIS — R809 Proteinuria, unspecified: Secondary | ICD-10-CM | POA: Diagnosis not present

## 2023-07-16 ENCOUNTER — Ambulatory Visit (INDEPENDENT_AMBULATORY_CARE_PROVIDER_SITE_OTHER): Payer: 59

## 2023-07-16 DIAGNOSIS — I255 Ischemic cardiomyopathy: Secondary | ICD-10-CM

## 2023-07-16 LAB — CUP PACEART REMOTE DEVICE CHECK
Battery Remaining Percentage: 84 %
Date Time Interrogation Session: 20241226101900
HighPow Impedance: 65 Ohm
Implantable Lead Connection Status: 753985
Implantable Lead Implant Date: 20230703
Implantable Lead Location: 753862
Implantable Lead Model: 3501
Implantable Lead Serial Number: 235028
Implantable Pulse Generator Implant Date: 20230703
Pulse Gen Serial Number: 174730

## 2023-07-28 LAB — LAB REPORT - SCANNED
A1c: 7.8
Calcium: 9.1
EGFR: 53
TSH: 1.85 (ref 0.41–5.90)

## 2023-08-19 ENCOUNTER — Encounter: Payer: Self-pay | Admitting: Internal Medicine

## 2023-08-21 NOTE — Progress Notes (Signed)
 Remote ICD transmission.

## 2023-09-01 NOTE — Progress Notes (Unsigned)
Cardiology Office Note    Date:  09/02/2023  ID:  GABRIAL DOMINE, DOB [birth year], MRN [id number] Cardiologist: Dina Rich, MD    History of Present Illness:    CORRY IHNEN is a 73 y.o. male with past medical history of CAD (s/p STEMI in 07/2021 with DES to proximal LAD and RCA was occluded with L--> R collaterals), chronic HFrEF (EF 30-35% in 07/2021 and 10/2021, s/p ICD placement in 01/2022), HTN, HLD, Type 2 DM, Stage 3 CKD and tobacco use who presents to the office today for 67-month follow-up.  He was last examined by myself in 12/2022 and was overall doing well at that time and denied any recent anginal symptoms. He had recently self discontinued Spironolactone due to having dizziness with positional changes while taking the medication. He was continued on his current cardiac medications and follow-up with the EP was rescheduled as he had missed his prior appointment. A follow-up echocardiogram was also recommended for reassessment of his EF. He did have a follow-up echocardiogram in 01/2023 which showed his EF remained similar to prior imaging at 30 to 35%. Was continued on Jardiance, Toprol-XL and Entresto and was encouraged to arrange follow-up again with with the Advanced Heart Failure clinic as he had missed prior visits.  In talking with the patient today, he reports overall doing well since his last office visit. He enjoys working outside when the weather cooperates and cleans his cars frequently. He denies any recent chest pain or dyspnea on exertion with routine activities. No recent palpitations, orthopnea, PND or pitting edema. He reports good compliance with his medications but has been without Toprol-XL due to not having refills.   Studies Reviewed:   EKG: EKG is not ordered today.  Cardiac Catheterization: 07/2021   Prox RCA lesion is 99% stenosed.   Mid RCA to Dist RCA lesion is 100% stenosed.   Ost Cx to Mid Cx lesion is 40% stenosed.   2nd Mrg lesion is 60%  stenosed.   Dist Cx lesion is 50% stenosed.   Prox LAD to Mid LAD lesion is 99% stenosed.   Mid LAD lesion is 40% stenosed.   A drug-eluting stent was successfully placed using a SYNERGY XD [id number].   Post intervention, there is a 0% residual stenosis.   The LAD is a large vessel that courses to the apex. The proximal and mid LAD is calcified. The mid LAD has a focal, severe stenosis. The distal LAD has diffuse mild stenosis The Circumflex is a large caliber vessel with mild heavily calcified proximal stenosis. The obtuse marginal branch is a moderate caliber vessel with moderate ostial stenosis The RCA is a moderate caliber vessel with severe mid stenosis and total occlusion in the distal vessel. The distal RCA branches fill from left to right collaterals.  Elevated LVEDP   Recommendations: He is pain free. Will admit to telemetry unit. Will plan DAPT for one year with ASA/Brilinta. High intensity statin. Echo tomorrow. No beta blocker or Ace-inh today with soft BP. Will give Lasix 40 mg IV tonight. Resume home insulin therapy.     Limited Echocardiogram: 01/2023 IMPRESSIONS     1. Left ventricular ejection fraction, by estimation, is 30 to 35%. The  left ventricle has moderately decreased function. The left ventricle  demonstrates global hypokinesis.   2. Right ventricular systolic function is normal. The right ventricular  size is normal.   3. Limited echo evaluate LV function   Physical Exam:   VS:  BP  120/70 (BP Location: Right Arm, Patient Position: Sitting, Cuff Size: Normal)   Pulse 84   Ht 5\' 11"  (1.803 m)   Wt 196 lb (88.9 kg)   SpO2 98%   BMI 27.34 kg/m    Wt Readings from Last 3 Encounters:  09/02/23 196 lb (88.9 kg)  06/09/23 180 lb (81.6 kg)  01/02/23 187 lb 9.6 oz (85.1 kg)     GEN: Pleasant male appearing in no acute distress NECK: No JVD; No carotid bruits CARDIAC: RRR, no murmurs, rubs, gallops RESPIRATORY:  Clear to auscultation without rales, wheezing  or rhonchi  ABDOMEN: Appears non-distended. No obvious abdominal masses. EXTREMITIES: No clubbing or cyanosis. No pitting edema.  Distal pedal pulses are 2+ bilaterally.   Assessment and Plan:   1. CAD - He did have a STEMI in 07/2021 and underwent DES placement to the proximal LAD and had an occluded RCA with left-to-right collaterals.  - He denies any recent anginal symptoms. Continue ASA 81 mg daily (has been taking this but not on current medication list -will update) and Atorvastatin 80 mg daily. Will restart Toprol-XL 25 mg daily.  2. Chronic HFrEF/ICD in place - His EF was at 30 to 35% by echocardiogram in 07/2021 and similar by repeat imaging in 01/2023. He does have an ICD in place which is followed by Dr. Graciela Husbands and most recent interrogation in 06/2023 showed normal device function. He is overdue for follow-up and will ask the front office to arrange for EP follow-up.   - He appears euvolemic by examination today and denies any recent respiratory issues. Will continue current medical therapy with Jardiance 25 mg daily (on this for Type 2 DM as well), Entresto 49-51 mg twice daily and will restart Toprol-XL 25 mg daily. He was previously intolerant to Spironolactone due to dizziness and orthostasis.  3. HTN - BP is well-controlled at 120/70 during today's visit. Continue Entresto and will restart Toprol-XL as discussed above.  4. HLD - LDL was at 66 when checked by the [place] in 07/2023. Continue current medical therapy with Atorvastatin 80 mg daily.  5. Stage 3 CKD - Baseline creatinine 1.2 - 1.3. Was at 1.42 when checked by the [place] in 07/2023.  Signed, Ellsworth Lennox, PA-C 09/02/2023, 2:29 PM Pager: [phone]

## 2023-09-02 ENCOUNTER — Ambulatory Visit: Payer: 59 | Attending: Student | Admitting: Student

## 2023-09-02 ENCOUNTER — Encounter: Payer: Self-pay | Admitting: Student

## 2023-09-02 VITALS — BP 120/70 | HR 84 | Ht 71.0 in | Wt 196.0 lb

## 2023-09-02 DIAGNOSIS — I5020 Unspecified systolic (congestive) heart failure: Secondary | ICD-10-CM

## 2023-09-02 DIAGNOSIS — E785 Hyperlipidemia, unspecified: Secondary | ICD-10-CM | POA: Diagnosis not present

## 2023-09-02 DIAGNOSIS — I2510 Atherosclerotic heart disease of native coronary artery without angina pectoris: Secondary | ICD-10-CM

## 2023-09-02 DIAGNOSIS — I10 Essential (primary) hypertension: Secondary | ICD-10-CM

## 2023-09-02 DIAGNOSIS — Z95810 Presence of automatic (implantable) cardiac defibrillator: Secondary | ICD-10-CM | POA: Diagnosis not present

## 2023-09-02 DIAGNOSIS — N1831 Chronic kidney disease, stage 3a: Secondary | ICD-10-CM | POA: Diagnosis not present

## 2023-09-02 MED ORDER — SACUBITRIL-VALSARTAN 49-51 MG PO TABS: 1.0000 | ORAL_TABLET | Freq: Two times a day (BID) | ORAL | 3 refills | Status: AC

## 2023-09-02 MED ORDER — ASPIRIN 81 MG PO TBEC: 81.0000 mg | DELAYED_RELEASE_TABLET | Freq: Every day | ORAL | Status: AC

## 2023-09-02 MED ORDER — METOPROLOL SUCCINATE ER 25 MG PO TB24: 25.0000 mg | ORAL_TABLET | Freq: Every day | ORAL | 3 refills | Status: AC

## 2023-09-02 NOTE — Patient Instructions (Signed)
Medication Instructions:   Restart Toprol-XL 25mg  daily.   *If you need a refill on your cardiac medications before your next appointment, please call your pharmacy*   Follow-Up: At [facility], you and your health needs are our priority.  As part of our continuing mission to provide you with exceptional heart care, we have created designated Provider Care Teams.  These Care Teams include your primary Cardiologist (physician) and Advanced Practice Providers (APPs -  Physician Assistants and Nurse Practitioners) who all work together to provide you with the care you need, when you need it.  We recommend signing up for the patient portal called "MyChart".  Sign up information is provided on this After Visit Summary.  MyChart is used to connect with patients for Virtual Visits (Telemedicine).  Patients are able to view lab/test results, encounter notes, upcoming appointments, etc.  Non-urgent messages can be sent to your provider as well.   To learn more about what you can do with MyChart, go to [email or web address].    Your next appointment:   6 months with Randall An, PA or Dr. Wyline Mood.   Follow-up with Dr. Graciela Husbands for ICD.

## 2023-10-13 LAB — CUP PACEART REMOTE DEVICE CHECK
Battery Remaining Percentage: 81 %
Date Time Interrogation Session: 20250325054200
HighPow Impedance: 60 Ohm
Implantable Lead Connection Status: 753985
Implantable Lead Implant Date: 20230703
Implantable Lead Location: 753862
Implantable Lead Model: 3501
Implantable Lead Serial Number: 235028
Implantable Pulse Generator Implant Date: 20230703
Pulse Gen Serial Number: 174730

## 2023-10-15 ENCOUNTER — Ambulatory Visit (INDEPENDENT_AMBULATORY_CARE_PROVIDER_SITE_OTHER): Payer: Medicare Other

## 2023-10-15 DIAGNOSIS — I255 Ischemic cardiomyopathy: Secondary | ICD-10-CM | POA: Diagnosis not present

## 2023-10-15 DIAGNOSIS — E1165 Type 2 diabetes mellitus with hyperglycemia: Secondary | ICD-10-CM | POA: Diagnosis not present

## 2023-10-15 DIAGNOSIS — E785 Hyperlipidemia, unspecified: Secondary | ICD-10-CM | POA: Diagnosis not present

## 2023-10-19 ENCOUNTER — Ambulatory Visit: Payer: 59 | Admitting: Internal Medicine

## 2023-10-21 DIAGNOSIS — Z955 Presence of coronary angioplasty implant and graft: Secondary | ICD-10-CM | POA: Diagnosis not present

## 2023-10-21 DIAGNOSIS — N1831 Chronic kidney disease, stage 3a: Secondary | ICD-10-CM | POA: Diagnosis not present

## 2023-10-21 DIAGNOSIS — I252 Old myocardial infarction: Secondary | ICD-10-CM | POA: Diagnosis not present

## 2023-10-21 DIAGNOSIS — R809 Proteinuria, unspecified: Secondary | ICD-10-CM | POA: Diagnosis not present

## 2023-10-21 DIAGNOSIS — E785 Hyperlipidemia, unspecified: Secondary | ICD-10-CM | POA: Diagnosis not present

## 2023-10-21 DIAGNOSIS — I129 Hypertensive chronic kidney disease with stage 1 through stage 4 chronic kidney disease, or unspecified chronic kidney disease: Secondary | ICD-10-CM | POA: Diagnosis not present

## 2023-10-21 DIAGNOSIS — R7401 Elevation of levels of liver transaminase levels: Secondary | ICD-10-CM | POA: Diagnosis not present

## 2023-10-21 DIAGNOSIS — I10 Essential (primary) hypertension: Secondary | ICD-10-CM | POA: Diagnosis not present

## 2023-10-21 DIAGNOSIS — M21619 Bunion of unspecified foot: Secondary | ICD-10-CM | POA: Diagnosis not present

## 2023-10-21 DIAGNOSIS — R931 Abnormal findings on diagnostic imaging of heart and coronary circulation: Secondary | ICD-10-CM | POA: Diagnosis not present

## 2023-10-21 DIAGNOSIS — E1122 Type 2 diabetes mellitus with diabetic chronic kidney disease: Secondary | ICD-10-CM | POA: Diagnosis not present

## 2023-10-21 DIAGNOSIS — E1165 Type 2 diabetes mellitus with hyperglycemia: Secondary | ICD-10-CM | POA: Diagnosis not present

## 2023-10-31 ENCOUNTER — Encounter: Payer: Self-pay | Admitting: Internal Medicine

## 2023-11-25 NOTE — Progress Notes (Signed)
 Remote ICD transmission.

## 2023-11-27 ENCOUNTER — Ambulatory Visit: Admitting: Internal Medicine

## 2023-12-21 NOTE — Progress Notes (Signed)
  Electrophysiology Office Note:   ID:  Jose Clark, Jose Clark 09-03-1950, MRN [id number]  Primary Cardiologist: Armida Lander, MD Electrophysiologist: Richardo Chandler, MD      History of Present Illness:   Jose Clark is a 73 y.o. male with h/o ICM and CHF s/p S-ICD seen today for routine electrophysiology followup.   Since last being seen in our clinic the patient reports doing well. Overall, he denies chest pain, palpitations, dyspnea, PND, orthopnea, nausea, vomiting, syncope, edema, weight gain, or early satiety. Mild lightheadedness with rapid standing, but not marked or limiting.   Review of systems complete and found to be negative unless listed in HPI.   EP Information / Studies Reviewed:    EKG is not ordered today. EKG from 06/09/2023 reviewed which showed NSR at 93       ICD Interrogation-  reviewed in detail today,  See PACEART report.  Arrhythmia/Device History Bost sci S-ICD implanted 01/2022 for CHF/ICM   Physical Exam:   VS:  BP 116/76   Pulse 77   Ht 5\' 11"  (1.803 m)   Wt 189 lb 12.8 oz (86.1 kg)   SpO2 98%   BMI 26.47 kg/m    Wt Readings from Last 3 Encounters:  12/22/23 189 lb 12.8 oz (86.1 kg)  09/02/23 196 lb (88.9 kg)  06/09/23 180 lb (81.6 kg)     GEN: No acute distress  NECK: No JVD; No carotid bruits CARDIAC: Regular rate and rhythm, no murmurs, rubs, gallops RESPIRATORY:  Clear to auscultation without rales, wheezing or rhonchi  ABDOMEN: Soft, non-tender, non-distended EXTREMITIES:  No edema; No deformity   ASSESSMENT AND PLAN:    Chronic systolic CHF  s/p Environmental manager S-ICD  euvolemic today Stable on an appropriate medical regimen Normal ICD function See Pace Art report No changes today  CAD No s/s of ischemia.      Disposition:   Follow up with EP Team in 12 months   Signed, Tylene Galla, PA-C

## 2023-12-22 ENCOUNTER — Ambulatory Visit: Attending: Student | Admitting: Student

## 2023-12-22 ENCOUNTER — Encounter: Payer: Self-pay | Admitting: Student

## 2023-12-22 VITALS — BP 116/76 | HR 77 | Ht 71.0 in | Wt 189.8 lb

## 2023-12-22 DIAGNOSIS — I5020 Unspecified systolic (congestive) heart failure: Secondary | ICD-10-CM | POA: Diagnosis not present

## 2023-12-22 DIAGNOSIS — I2510 Atherosclerotic heart disease of native coronary artery without angina pectoris: Secondary | ICD-10-CM | POA: Diagnosis not present

## 2023-12-22 DIAGNOSIS — Z95810 Presence of automatic (implantable) cardiac defibrillator: Secondary | ICD-10-CM

## 2023-12-22 DIAGNOSIS — I255 Ischemic cardiomyopathy: Secondary | ICD-10-CM

## 2023-12-22 LAB — CUP PACEART INCLINIC DEVICE CHECK
Date Time Interrogation Session: 20250603085138
Implantable Lead Connection Status: 753985
Implantable Lead Implant Date: 20230703
Implantable Lead Location: 753862
Implantable Lead Model: 3501
Implantable Lead Serial Number: 235028
Implantable Pulse Generator Implant Date: 20230703
Pulse Gen Serial Number: 174730

## 2023-12-22 NOTE — Patient Instructions (Signed)
 Medication Instructions:  No medication changes today. *If you need a refill on your cardiac medications before your next appointment, please call your pharmacy*  Lab Work: No labwork ordered today. If you have labs (blood work) drawn today and your tests are completely normal, you will receive your results only by: MyChart Message (if you have MyChart) OR A paper copy in the mail If you have any lab test that is abnormal or we need to change your treatment, we will call you to review the results.  Testing/Procedures: No testing ordered today  Follow-Up: At [facility], you and your health needs are our priority.  As part of our continuing mission to provide you with exceptional heart care, our providers are all part of one team.  This team includes your primary Cardiologist (physician) and Advanced Practice Providers or APPs (Physician Assistants and Nurse Practitioners) who all work together to provide you with the care you need, when you need it.  Your next appointment:   12 month(s)  Provider:   You may see Richardo Chandler, MD or one of the following Advanced Practice Providers on your designated Care Team:   Mertha Abrahams, Kennard Pea "Jonelle Neri" [place], PA-C Suzann Riddle, NP Creighton Doffing, NP    We recommend signing up for the patient portal called "MyChart".  Sign up information is provided on this After Visit Summary.  MyChart is used to connect with patients for Virtual Visits (Telemedicine).  Patients are able to view lab/test results, encounter notes, upcoming appointments, etc.  Non-urgent messages can be sent to your provider as well.   To learn more about what you can do with MyChart, go to [email or web address].

## 2023-12-29 ENCOUNTER — Ambulatory Visit: Payer: Self-pay | Admitting: Cardiovascular Disease

## 2024-01-14 ENCOUNTER — Ambulatory Visit (INDEPENDENT_AMBULATORY_CARE_PROVIDER_SITE_OTHER): Payer: Medicare Other

## 2024-01-14 DIAGNOSIS — I255 Ischemic cardiomyopathy: Secondary | ICD-10-CM

## 2024-01-15 LAB — CUP PACEART REMOTE DEVICE CHECK
Battery Remaining Percentage: 78 %
Date Time Interrogation Session: 20250627004500
HighPow Impedance: 65 Ohm
Implantable Lead Connection Status: 753985
Implantable Lead Implant Date: 20230703
Implantable Lead Location: 753862
Implantable Lead Model: 3501
Implantable Lead Serial Number: 235028
Implantable Pulse Generator Implant Date: 20230703
Pulse Gen Serial Number: 174730

## 2024-01-27 ENCOUNTER — Telehealth: Payer: Self-pay | Admitting: Internal Medicine

## 2024-01-27 MED ORDER — NITROGLYCERIN 0.4 MG SL SUBL: 0.4000 mg | SUBLINGUAL_TABLET | SUBLINGUAL | 7 refills | Status: AC | PRN

## 2024-01-27 NOTE — Telephone Encounter (Signed)
*  STAT* If patient is at the pharmacy, call can be transferred to refill team.   1. Which medications need to be refilled? (please list name of each medication and dose if known) new prescription for Nitroglycerin    2. Would you like to learn more about the convenience, safety, & potential cost savings by using the [facility]?      3. Are you open to using the [facility] (Type [facility].    4. Which pharmacy/location (including street and city if local pharmacy) is medication to be sent to? [address] [place]   5. Do they need a 30 day or 90 day supply?

## 2024-01-27 NOTE — Telephone Encounter (Signed)
 Pt's medication was sent to pt's pharmacy as requested. Confirmation received.

## 2024-02-10 DIAGNOSIS — E1165 Type 2 diabetes mellitus with hyperglycemia: Secondary | ICD-10-CM | POA: Diagnosis not present

## 2024-02-10 DIAGNOSIS — E785 Hyperlipidemia, unspecified: Secondary | ICD-10-CM | POA: Diagnosis not present

## 2024-02-10 DIAGNOSIS — R7401 Elevation of levels of liver transaminase levels: Secondary | ICD-10-CM | POA: Diagnosis not present

## 2024-02-15 DIAGNOSIS — R7401 Elevation of levels of liver transaminase levels: Secondary | ICD-10-CM | POA: Diagnosis not present

## 2024-02-15 DIAGNOSIS — I129 Hypertensive chronic kidney disease with stage 1 through stage 4 chronic kidney disease, or unspecified chronic kidney disease: Secondary | ICD-10-CM | POA: Diagnosis not present

## 2024-02-15 DIAGNOSIS — I10 Essential (primary) hypertension: Secondary | ICD-10-CM | POA: Diagnosis not present

## 2024-02-15 DIAGNOSIS — E1165 Type 2 diabetes mellitus with hyperglycemia: Secondary | ICD-10-CM | POA: Diagnosis not present

## 2024-02-15 DIAGNOSIS — N1831 Chronic kidney disease, stage 3a: Secondary | ICD-10-CM | POA: Diagnosis not present

## 2024-02-15 DIAGNOSIS — E1122 Type 2 diabetes mellitus with diabetic chronic kidney disease: Secondary | ICD-10-CM | POA: Diagnosis not present

## 2024-02-15 DIAGNOSIS — E785 Hyperlipidemia, unspecified: Secondary | ICD-10-CM | POA: Diagnosis not present

## 2024-02-25 ENCOUNTER — Ambulatory Visit: Admitting: Infectious Diseases

## 2024-02-25 ENCOUNTER — Ambulatory Visit: Payer: Self-pay | Admitting: Cardiology

## 2024-03-07 DIAGNOSIS — E1122 Type 2 diabetes mellitus with diabetic chronic kidney disease: Secondary | ICD-10-CM | POA: Diagnosis not present

## 2024-03-07 DIAGNOSIS — I129 Hypertensive chronic kidney disease with stage 1 through stage 4 chronic kidney disease, or unspecified chronic kidney disease: Secondary | ICD-10-CM | POA: Diagnosis not present

## 2024-03-07 DIAGNOSIS — Z87891 Personal history of nicotine dependence: Secondary | ICD-10-CM | POA: Diagnosis not present

## 2024-03-07 DIAGNOSIS — Z713 Dietary counseling and surveillance: Secondary | ICD-10-CM | POA: Diagnosis not present

## 2024-03-07 DIAGNOSIS — I10 Essential (primary) hypertension: Secondary | ICD-10-CM | POA: Diagnosis not present

## 2024-03-07 DIAGNOSIS — E1165 Type 2 diabetes mellitus with hyperglycemia: Secondary | ICD-10-CM | POA: Diagnosis not present

## 2024-03-07 DIAGNOSIS — R7401 Elevation of levels of liver transaminase levels: Secondary | ICD-10-CM | POA: Diagnosis not present

## 2024-03-07 DIAGNOSIS — N1831 Chronic kidney disease, stage 3a: Secondary | ICD-10-CM | POA: Diagnosis not present

## 2024-03-07 DIAGNOSIS — E785 Hyperlipidemia, unspecified: Secondary | ICD-10-CM | POA: Diagnosis not present

## 2024-03-08 ENCOUNTER — Other Ambulatory Visit (HOSPITAL_COMMUNITY): Payer: Self-pay

## 2024-03-08 ENCOUNTER — Telehealth: Payer: Self-pay

## 2024-03-08 NOTE — Telephone Encounter (Signed)
 [facility] Patient Advocate Encounter  Insurance verification completed.    The patient is insured through [employer].     Ran test claim for MAVYRET  Medication will need a PA. PREFERRED  Ran test claim for EPCLUSA  Medication will need a PA.   We will continue to follow to see if copay assistance is needed.  This test claim was processed through [facility]- copay amounts may vary at other pharmacies due to pharmacy/plan contracts, or as the patient moves through the different stages of their insurance plan.

## 2024-03-17 ENCOUNTER — Encounter: Payer: Self-pay | Admitting: Infectious Diseases

## 2024-03-17 ENCOUNTER — Other Ambulatory Visit: Payer: Self-pay

## 2024-03-17 ENCOUNTER — Ambulatory Visit: Admitting: Infectious Diseases

## 2024-03-17 VITALS — BP 105/72 | HR 85 | Temp 97.8°F | Ht 71.0 in | Wt 191.0 lb

## 2024-03-17 DIAGNOSIS — Z114 Encounter for screening for human immunodeficiency virus [HIV]: Secondary | ICD-10-CM

## 2024-03-17 DIAGNOSIS — B1920 Unspecified viral hepatitis C without hepatic coma: Secondary | ICD-10-CM

## 2024-03-17 DIAGNOSIS — Z0000 Encounter for general adult medical examination without abnormal findings: Secondary | ICD-10-CM | POA: Diagnosis not present

## 2024-03-17 DIAGNOSIS — Z1159 Encounter for screening for other viral diseases: Secondary | ICD-10-CM

## 2024-03-17 NOTE — Progress Notes (Addendum)
 [facility] for Infectious Diseases                                      [address], [place], [place], [address]                                               Phn. [phone]; Fax: [phone]                                                               Date: 03/17/24 Reason for Visit: Hepatitis C   HPI: Jose Clark is a 73 y.o.old male with h/o DM w neuropathy, HLD, HTN, CAD/Ischemic Cardiomyopathy s/p AICD, CKD, CHF, MR, tobacco abuse who is referred for evaluation and management of HCV.   He reports being previously treated for HCV but cannot recall if he was cured. He took medication years ago but cannot recall specifics about the treatment or medication taken.   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 Denies known sexual partners with hepatitis C, history of sharing needles, or blood transfusions. Denies known family history of liver disease or liver malignancy.   Denies any hospitalizations related to liver disease/cirrhosis, jaundice, GI bleeding, mental status changes, abdominal pain/distension, loss of appetite/weight, fatigue, edema and acholic/bloody/black stool.   Denies symptoms of extra GI manifestations like abnormal skin rash,diffuse joint pain/swelling.   He consumes alcohol moderately, with three to four beers per week. He lives with his daughter and is retired, occasionally working on the side.  ROS: as above   Current Outpatient Medications on File Prior to Visit  Medication Sig Dispense Refill   aspirin  EC 81 MG tablet Take 1 tablet (81 mg total) by mouth daily. Swallow whole.     atorvastatin  (LIPITOR ) 80 MG tablet Take 1 tablet (80 mg total) by mouth daily. 90 tablet 3   Continuous Glucose Sensor (FREESTYLE LIBRE 2 SENSOR) MISC REPLACE SENSOR EVERY 14 DAYS     empagliflozin  (JARDIANCE ) 25 MG TABS tablet Take 25 mg by mouth daily.     HYDROcodone -acetaminophen  (NORCO/VICODIN)  5-325 MG tablet as needed.     insulin  aspart protamine- aspart (NOVOLOG  MIX 70/30) (70-30) 100 UNIT/ML injection as needed.     insulin  glargine (LANTUS ) 100 unit/mL SOPN Inject 38 Units into the skin at bedtime. 15 mL 1   insulin  NPH-insulin  regular (NOVOLIN 70/30) (70-30) 100 UNIT/ML injection Inject 10-25 Units into the skin daily as needed (CBG >180).     metoprolol  succinate (TOPROL -XL) 25 MG 24 hr tablet Take 1 tablet (25 mg total) by mouth daily. 90 tablet 3   nitroGLYCERIN  (NITROSTAT ) 0.4 MG SL tablet Place 1 tablet (0.4 mg total) under the tongue every 5 (five) minutes as needed. 25 tablet 7   sacubitril -valsartan  (ENTRESTO ) 49-51 MG Take 1 tablet by mouth 2 (two) times daily. 180 tablet 3   tetrahydrozoline (VISINE) 0.05 % ophthalmic solution as needed.     traMADol (ULTRAM) 50 MG tablet as needed.     No current facility-administered medications on  file prior to visit.   No Known Allergies  Past Medical History:  Diagnosis Date   AICD (automatic cardioverter/defibrillator) present    CAD (coronary artery disease)    a. s/p STEMI in 07/2021 with DES to proximal LAD and RCA was occluded with L--> R collaterals   Chronic kidney disease, stage 3a (HCC)    Diabetes (HCC)    x 20 yrs   Diabetes mellitus    Heart disease    Hepatitis C    HFrEF (heart failure with reduced ejection fraction) (HCC)    a. EF 30-35% in 07/2021 and 10/2021, s/p ICD placement in 01/2022   Ischemic cardiomyopathy    Mitral regurgitation    Tobacco abuse    Past Surgical History:  Procedure Laterality Date   CARDIAC DEFIBRILLATOR PLACEMENT Left 01/20/2022   CIRCUMCISION     30 years ago   COLONOSCOPY N/A 01/24/2016   Procedure: COLONOSCOPY;  Surgeon: Claudis RAYMOND Rivet, MD;  Location: [facility];  Service: Endoscopy;  Laterality: N/A;  1130   CORONARY/GRAFT ACUTE MI REVASCULARIZATION N/A 08/03/2021   Procedure: Coronary/Graft Acute MI Revascularization;  Surgeon: Verlin Lonni BIRCH, MD;   Location: [facility] INVASIVE CV LAB;  Service: Cardiovascular;  Laterality: N/A;   LEFT HEART CATH AND CORONARY ANGIOGRAPHY N/A 08/03/2021   Procedure: LEFT HEART CATH AND CORONARY ANGIOGRAPHY;  Surgeon: Verlin Lonni BIRCH, MD;  Location: [facility] INVASIVE CV LAB;  Service: Cardiovascular;  Laterality: N/A;   SUBQ ICD IMPLANT N/A 01/20/2022   Procedure: SUBQ ICD IMPLANT;  Surgeon: Fernande Elspeth BROCKS, MD;  Location: [facility] INVASIVE CV LAB;  Service: Cardiovascular;  Laterality: N/A;   Social History   Socioeconomic History   Marital status: Single    Spouse name: Not on file   Number of children: 1   Years of education: Not on file   Highest education level: High school graduate  Occupational History   Occupation: retired  Tobacco Use   Smoking status: Former    Current packs/day: 0.00    Average packs/day: 0.5 packs/day for 20.0 years (10.0 ttl pk-yrs)    Types: Cigarettes    Start date: 08/04/2000    Quit date: 08/04/2020    Years since quitting: 3.6   Smokeless tobacco: Never   Tobacco comments:    Quit upon admission to hospital.  Vaping Use   Vaping status: Never Used  Substance and Sexual Activity   Alcohol use: Yes    Comment: occassionally  does not drink everyday. Occasional beer.   Drug use: Not Currently    Comment: cocaine   Sexual activity: Yes  Other Topics Concern   Not on file  Social History Narrative   Not on file   Social Drivers of Health   Financial Resource Strain: Low Risk  (08/06/2021)   Overall Financial Resource Strain (CARDIA)    Difficulty of Paying Living Expenses: Not very hard  Food Insecurity: No Food Insecurity (08/06/2021)   Hunger Vital Sign    Worried About Running Out of Food in the Last Year: Never true    Ran Out of Food in the Last Year: Never true  Transportation Needs: No Transportation Needs (08/06/2021)   PRAPARE - Administrator, Civil Service (Medical): No    Lack of Transportation (Non-Medical): No  Physical Activity: Not on file   Stress: Not on file  Social Connections: Not on file  Intimate Partner Violence: Not on file   Family History  Problem Relation Age of Onset  Diabetes Mother    Dementia Mother    CVA Father    Vitals  BP 105/72   Pulse 85   Temp 97.8 F (36.6 C) (Temporal)   Ht 5' 11 (1.803 m)   Wt 191 lb (86.6 kg)   SpO2 97%   BMI 26.64 kg/m   Gen:  no acute distress HEENT: [place]/AT, no scleral icterus, no pale conjunctivae, hearing normal, oral mucosa moist Neck: Supple Cardio: Regular rate and rhythm, s1 and s2 Resp: Pulmonary effort normal on room air, Normal breath sounds.  GI: Soft, nontender, nondistended GU: MSK - no pedal edema Skin: No rashes, ICD site OK Neuro: Grossly non focal, awake, alert and oriented * 3 Psych: Calm, cooperative  Laboratory  7/23 HCV ab reactive, HCV RNA 9280000 international/ml  Assessment/Plan: # Chronic Hepatitis C, Treatment experienced - Per EMR, Prior Genotype 1A. Started Harvoni 07/24/2015.  He had a lapse in tx due to not calling pharmacy for refill on Harvoni. 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  - 11/14/2015 HCV RNA negative   Plan  - labs today  - US  abdomen w elastography  - fu pending above   # HCM - check serology for HAV and HBV to see if needs to be vaccinated    I spent 61 minutes involved in face-to-face and non-face-to-face activities for this patient on the day of the visit. Professional time spent includes the following activities: Preparing to see the patient (review of tests), Obtaining and reviewing separately obtained history (Outside referral note), Performing a medically appropriate examination and evaluation , Ordering labs and imaging, referring and communicating with other health care professionals, Documenting clinical information in the EMR, Independently interpreting results (not separately reported), Communicating results to the patient, Counseling and educating the patient and Care coordination (not separately  reported).   Patients questions were addressed and answered.   Of note, portions of this note may have been created with voice recognition software. While this note has been edited for accuracy, occasional wrong-word or 'sound-a-like' substitutions may have occurred due to the inherent limitations of voice recognition software.   Electronically signed by: Annalee Orem, MD Infectious Diseases  Office phone [phone] Fax no. [phone]

## 2024-03-19 DIAGNOSIS — Z114 Encounter for screening for human immunodeficiency virus [HIV]: Secondary | ICD-10-CM | POA: Insufficient documentation

## 2024-03-19 DIAGNOSIS — Z0000 Encounter for general adult medical examination without abnormal findings: Secondary | ICD-10-CM | POA: Insufficient documentation

## 2024-03-19 DIAGNOSIS — Z1159 Encounter for screening for other viral diseases: Secondary | ICD-10-CM | POA: Insufficient documentation

## 2024-03-23 LAB — COMPREHENSIVE METABOLIC PANEL WITH GFR
AG Ratio: 1.3 (calc) (ref 1.0–2.5)
ALT: 26 U/L (ref 9–46)
AST: 31 U/L (ref 10–35)
Albumin: 3.8 g/dL (ref 3.6–5.1)
Alkaline phosphatase (APISO): 73 U/L (ref 35–144)
BUN: 20 mg/dL (ref 7–25)
CO2: 28 mmol/L (ref 20–32)
Calcium: 9.2 mg/dL (ref 8.6–10.3)
Chloride: 103 mmol/L (ref 98–110)
Creat: 1.27 mg/dL (ref 0.70–1.28)
Globulin: 2.9 g/dL (ref 1.9–3.7)
Glucose, Bld: 160 mg/dL — ABNORMAL HIGH (ref 65–99)
Potassium: 4.7 mmol/L (ref 3.5–5.3)
Sodium: 137 mmol/L (ref 135–146)
Total Bilirubin: 0.5 mg/dL (ref 0.2–1.2)
Total Protein: 6.7 g/dL (ref 6.1–8.1)
eGFR: 60 mL/min/1.73m2 (ref >=60)

## 2024-03-23 LAB — CBC
HCT: 44.6 % (ref 38.5–50.0)
Hemoglobin: 15.2 g/dL (ref 13.2–17.1)
MCH: 32.1 pg (ref 27.0–33.0)
MCHC: 34.1 g/dL (ref 32.0–36.0)
MCV: 94.1 fL (ref 80.0–100.0)
MPV: 10.6 fL (ref 7.5–12.5)
Platelets: 163 Thousand/uL (ref 140–400)
RBC: 4.74 Million/uL (ref 4.20–5.80)
RDW: 12.5 % (ref 11.0–15.0)
WBC: 5.1 Thousand/uL (ref 3.8–10.8)

## 2024-03-23 LAB — LIVER FIBROSIS, FIBROTEST-ACTITEST
ALT: 25 U/L (ref 9–46)
Alpha-2-Macroglobulin: 352 mg/dL — ABNORMAL HIGH (ref 106–279)
Apolipoprotein A1: 196 mg/dL — ABNORMAL HIGH (ref 94–176)
Bilirubin: 0.5 mg/dL (ref 0.2–1.2)
Fibrosis Score: 0.71
GGT: 124 U/L — ABNORMAL HIGH (ref 3–70)
Haptoglobin: 69 mg/dL (ref 43–212)
Necroinflammat ACT Score: 0.19
Reference ID: 5666124

## 2024-03-23 LAB — HEPATITIS C GENOTYPE

## 2024-03-23 LAB — HEPATITIS A ANTIBODY, TOTAL: Hepatitis A AB,Total: REACTIVE — AB

## 2024-03-23 LAB — HEPATITIS B SURFACE ANTIBODY,QUALITATIVE: Hep B S Ab: REACTIVE — AB

## 2024-03-23 LAB — HEPATITIS C RNA QUANTITATIVE
HCV Quantitative Log: 6.7 {Log_IU}/mL — ABNORMAL HIGH
HCV RNA, PCR, QN: 4980000 [IU]/mL — ABNORMAL HIGH

## 2024-03-23 LAB — PROTIME-INR
INR: 1
Prothrombin Time: 10.4 s (ref 9.0–11.5)

## 2024-03-23 LAB — HIV ANTIBODY (ROUTINE TESTING W REFLEX): HIV 1&2 Ab, 4th Generation: NONREACTIVE

## 2024-03-23 LAB — HEPATITIS B CORE ANTIBODY, TOTAL: Hep B Core Total Ab: NONREACTIVE

## 2024-03-23 LAB — HEPATITIS B SURFACE ANTIGEN: Hepatitis B Surface Ag: NONREACTIVE

## 2024-03-28 ENCOUNTER — Other Ambulatory Visit: Payer: Self-pay | Admitting: Student

## 2024-03-31 ENCOUNTER — Other Ambulatory Visit: Payer: Self-pay | Admitting: Student

## 2024-03-31 ENCOUNTER — Ambulatory Visit: Payer: Self-pay | Admitting: Infectious Diseases

## 2024-03-31 ENCOUNTER — Ambulatory Visit (HOSPITAL_COMMUNITY)
Admission: RE | Admit: 2024-03-31 | Discharge: 2024-03-31 | Disposition: A | Discharge status: Home / Self Care | Source: Ambulatory Visit | Attending: Infectious Diseases | Admitting: Infectious Diseases

## 2024-03-31 DIAGNOSIS — B1920 Unspecified viral hepatitis C without hepatic coma: Secondary | ICD-10-CM | POA: Diagnosis present

## 2024-04-14 ENCOUNTER — Ambulatory Visit (INDEPENDENT_AMBULATORY_CARE_PROVIDER_SITE_OTHER): Payer: Medicare Other

## 2024-04-14 DIAGNOSIS — I255 Ischemic cardiomyopathy: Secondary | ICD-10-CM

## 2024-04-18 LAB — CUP PACEART REMOTE DEVICE CHECK
Battery Remaining Percentage: 76 %
Date Time Interrogation Session: 20250929073600
HighPow Impedance: 70 Ohm
Implantable Lead Connection Status: 753985
Implantable Lead Implant Date: 20230703
Implantable Lead Location: 753862
Implantable Lead Model: 3501
Implantable Lead Serial Number: 235028
Implantable Pulse Generator Implant Date: 20230703
Pulse Gen Serial Number: 174730

## 2024-04-18 NOTE — Progress Notes (Signed)
 Remote ICD Transmission

## 2024-04-18 NOTE — Addendum Note (Signed)
 Addended by: VICCI SELLER A on: 04/18/2024 01:48 PM   Modules accepted: Orders

## 2024-04-19 ENCOUNTER — Other Ambulatory Visit (HOSPITAL_COMMUNITY): Payer: Self-pay

## 2024-04-19 NOTE — Progress Notes (Signed)
 Remote ICD Transmission

## 2024-04-19 NOTE — Progress Notes (Signed)
 Jose Clark/Jose Clark - Can we investigate insurance coverage? Thank you!   Dr. Dea- also, he is taking atorvastatin  80 mg daily for secondary prophylaxis with history of MI. Needs to be on lowest dose with monitoring - what about atorvastatin  20 mg daily while on Vosevi? Let me know!

## 2024-04-20 ENCOUNTER — Other Ambulatory Visit: Payer: Self-pay

## 2024-04-20 ENCOUNTER — Other Ambulatory Visit (HOSPITAL_COMMUNITY): Payer: Self-pay

## 2024-04-20 ENCOUNTER — Telehealth: Payer: Self-pay

## 2024-04-20 DIAGNOSIS — B1920 Unspecified viral hepatitis C without hepatic coma: Secondary | ICD-10-CM

## 2024-04-20 MED ORDER — VOSEVI 400-100-100 MG PO TABS
1.0000 | ORAL_TABLET | Freq: Every day | ORAL | 2 refills | Status: AC
  Filled 2024-04-21: qty 28, 28d supply, fill #0
  Filled 2024-05-17 – 2024-05-19 (×2): qty 28, 28d supply, fill #1
  Filled 2024-06-08 – 2024-06-09 (×2): qty 28, 28d supply, fill #2

## 2024-04-20 NOTE — Telephone Encounter (Signed)
 Submitted a Prior Authorization request to [facility] for VOSEVI via Latent. Will update once we receive a response.  J Code: CPT:  PA ID: [facility]

## 2024-04-20 NOTE — Addendum Note (Signed)
 Addended by: WADDELL ALAN PARAS on: 04/20/2024 03:05 PM   Modules accepted: Orders

## 2024-04-20 NOTE — Telephone Encounter (Signed)
 Received notification from [facility] regarding a prior authorization for Vosevi. Authorization has been APPROVED from 04/20/24 to 07/13/24.   Per test claim, copay for 28 days supply is $0.00  Patient can fill through [facility]: [phone]   Authorization # [id number]

## 2024-04-21 ENCOUNTER — Other Ambulatory Visit: Payer: Self-pay

## 2024-04-21 ENCOUNTER — Other Ambulatory Visit: Payer: Self-pay | Admitting: Pharmacist

## 2024-04-21 ENCOUNTER — Telehealth: Payer: Self-pay | Admitting: Pharmacist

## 2024-04-21 ENCOUNTER — Other Ambulatory Visit (HOSPITAL_COMMUNITY): Payer: Self-pay

## 2024-04-21 DIAGNOSIS — Z0000 Encounter for general adult medical examination without abnormal findings: Secondary | ICD-10-CM

## 2024-04-21 MED ORDER — ATORVASTATIN CALCIUM 20 MG PO TABS: ORAL_TABLET | ORAL | 0 refills | Status: AC

## 2024-04-21 NOTE — Progress Notes (Signed)
 Specialty Pharmacy Initiation Note   Jose Clark is a 73 y.o. male who will be followed by the specialty pharmacy service for RxSp Hepatitis C    Review of administration, indication, effectiveness, safety, potential side effects, storage/disposable, and missed dose instructions occurred today for patient's specialty medication(s) Sofosbuv-Velpatasv-Voxilaprev [name]     Patient/Caregiver did not have any additional questions or concerns.   Patient's therapy is appropriate to: Initiate    Goals Addressed             This Visit's Progress    Achieve virologic cure as evidenced by SVR       Patient is initiating therapy. Patient will be evaluated at upcoming provider appointment to assess progress      Comply with lab assessments       Patient is on track. Patient will adhere to provider and/or lab appointments      Maintain optimal adherence to therapy       Patient is initiating therapy. Patient will be evaluated at upcoming provider appointment to assess progress         Alan JINNY Geralds Specialty Pharmacist

## 2024-04-21 NOTE — Telephone Encounter (Signed)
 Patient is approved to receive Vosevi x 12 weeks for chronic Hepatitis C infection. Counseled patient to take Vosevi daily with his biggest meal of the day to increase absorption. Encouraged patient not to miss any doses and explained how their chance of cure could go down with each dose missed. Counseled patient on what to do if dose is missed - if it is closer to the missed dose take immediately; if closer to next dose skip dose and take the next dose at the usual time. Counseled patient on common side effects such as headache, fatigue, and nausea and that these normally decrease with time.    I reviewed patient medications and found one drug interaction with atorvastatin  80 mg daily. Discussed with Dr. Dea and will decrease to 20 mg daily to prevent toxicity while on Vosevi. Sent new prescription to [facility], and he verbalized understanding of this plan. Emphasized the importance of holding atorvastatin  80 mg daily and only taking the 20 mg tablet for 3 months. Discussed with patient that there are several drug interactions including acid suppressants. Instructed patient to call clinic if he wishes to start a new medication during course of therapy. Also advised patient to call if he experiences any side effects. Patient will schedule 72-month follow-up with us  once he picks up his medication in clinic.  Alan Geralds, PharmD, CPP, BCIDP, AAHIVP Clinical Pharmacist Practitioner Infectious Diseases Clinical Pharmacist [facility] for Infectious Disease

## 2024-04-21 NOTE — Progress Notes (Signed)
 Specialty Pharmacy Initial Fill Coordination Note  Jose Clark is a 73 y.o. male contacted today regarding initial fill of specialty medication(s) Sofosbuv-Velpatasv-Voxilaprev [name]   Patient requested Courier to Provider Office   Delivery date: 04/25/24   Verified address: [facility] [address] [place] [address]   Medication will be filled on 04/22/24.   Patient is aware of $0 copayment.

## 2024-04-22 ENCOUNTER — Other Ambulatory Visit: Payer: Self-pay

## 2024-04-22 ENCOUNTER — Ambulatory Visit: Payer: Self-pay | Admitting: Student in an Organized Health Care Education/Training Program

## 2024-04-25 ENCOUNTER — Telehealth: Payer: Self-pay

## 2024-04-25 NOTE — Telephone Encounter (Signed)
 [facility] Patient Advocate Encounter  Patient's medications Vosevi have been couriered to [facility] from [facility] and will be  picked up on 04/25/24.  Arland Hutchinson, CPhT Specialty Pharmacy Patient [facility] for Infectious Disease Phone: [phone] Fax:  [phone]

## 2024-05-03 DIAGNOSIS — E1165 Type 2 diabetes mellitus with hyperglycemia: Secondary | ICD-10-CM | POA: Diagnosis not present

## 2024-05-03 DIAGNOSIS — I10 Essential (primary) hypertension: Secondary | ICD-10-CM | POA: Diagnosis not present

## 2024-05-03 DIAGNOSIS — E785 Hyperlipidemia, unspecified: Secondary | ICD-10-CM | POA: Diagnosis not present

## 2024-05-04 ENCOUNTER — Encounter (INDEPENDENT_AMBULATORY_CARE_PROVIDER_SITE_OTHER): Payer: Self-pay | Admitting: Gastroenterology

## 2024-05-10 DIAGNOSIS — R7401 Elevation of levels of liver transaminase levels: Secondary | ICD-10-CM | POA: Diagnosis not present

## 2024-05-10 DIAGNOSIS — H9313 Tinnitus, bilateral: Secondary | ICD-10-CM | POA: Diagnosis not present

## 2024-05-10 DIAGNOSIS — I129 Hypertensive chronic kidney disease with stage 1 through stage 4 chronic kidney disease, or unspecified chronic kidney disease: Secondary | ICD-10-CM | POA: Diagnosis not present

## 2024-05-10 DIAGNOSIS — H6122 Impacted cerumen, left ear: Secondary | ICD-10-CM | POA: Diagnosis not present

## 2024-05-10 DIAGNOSIS — N1831 Chronic kidney disease, stage 3a: Secondary | ICD-10-CM | POA: Diagnosis not present

## 2024-05-10 DIAGNOSIS — Z0000 Encounter for general adult medical examination without abnormal findings: Secondary | ICD-10-CM | POA: Diagnosis not present

## 2024-05-10 DIAGNOSIS — I10 Essential (primary) hypertension: Secondary | ICD-10-CM | POA: Diagnosis not present

## 2024-05-10 DIAGNOSIS — M25476 Effusion, unspecified foot: Secondary | ICD-10-CM | POA: Diagnosis not present

## 2024-05-10 DIAGNOSIS — E1165 Type 2 diabetes mellitus with hyperglycemia: Secondary | ICD-10-CM | POA: Diagnosis not present

## 2024-05-10 DIAGNOSIS — E785 Hyperlipidemia, unspecified: Secondary | ICD-10-CM | POA: Diagnosis not present

## 2024-05-10 DIAGNOSIS — Z0001 Encounter for general adult medical examination with abnormal findings: Secondary | ICD-10-CM | POA: Diagnosis not present

## 2024-05-12 ENCOUNTER — Other Ambulatory Visit: Payer: Self-pay

## 2024-05-12 ENCOUNTER — Encounter (INDEPENDENT_AMBULATORY_CARE_PROVIDER_SITE_OTHER): Payer: Self-pay

## 2024-05-17 ENCOUNTER — Other Ambulatory Visit: Payer: Self-pay

## 2024-05-19 ENCOUNTER — Other Ambulatory Visit (HOSPITAL_COMMUNITY): Payer: Self-pay

## 2024-05-19 ENCOUNTER — Other Ambulatory Visit: Payer: Self-pay

## 2024-05-19 NOTE — Progress Notes (Signed)
 Specialty Pharmacy Refill Coordination Note  Jose Clark is a 73 y.o. male contacted today regarding refills of specialty medication(s) Sofosbuv-Velpatasv-Voxilaprev [name]   Patient requested [name] at [facility] at [place] date: 05/20/24   Medication will be filled on: 05/19/24

## 2024-05-20 ENCOUNTER — Other Ambulatory Visit (HOSPITAL_COMMUNITY): Payer: Self-pay

## 2024-05-24 ENCOUNTER — Encounter: Admitting: Nutrition

## 2024-05-25 ENCOUNTER — Ambulatory Visit: Admitting: Pharmacist

## 2024-05-29 NOTE — Progress Notes (Deleted)
 HPI: Jose Clark is a 73 y.o. male who presents to the [facility] pharmacy clinic for Hepatitis C follow-up.  Medication: Vosevi x 12 weeks - treatment experienced  Start Date: 04/26/24  Hepatitis C Genotype: 1a  Fibrosis Score: F3  Hepatitis C RNA: 4.90 million on 03/17/24  Referring ID Provider: Dr. Dea   Patient Active Problem List   Diagnosis Date Noted   Health care maintenance 03/19/2024   Need for hepatitis B screening test 03/19/2024   Screening for HIV (human immunodeficiency virus) 03/19/2024   Hypoglycemia 08/19/2022   Combined systolic and diastolic congestive heart failure /EF 30-35 %/G1DD 08/19/2022   ICD (implantable cardioverter-defibrillator) in place 04/22/2022   Ischemic cardiomyopathy 12/20/2021   Chronic systolic heart failure (HCC) 12/20/2021   NSTEMI (non-ST elevated myocardial infarction) (HCC) 08/03/2021   Diabetes (HCC) 11/10/2014   Hepatitis C 11/10/2014    Patient's Medications  New Prescriptions   No medications on file  Previous Medications   ASPIRIN  EC 81 MG TABLET    Take 1 tablet (81 mg total) by mouth daily. Swallow whole.   ATORVASTATIN  (LIPITOR ) 20 MG TABLET    Take 1 tablet (20 mg) daily while taking Vosevi for 3 months. HOLD atorvastatin  80 mg while taking Vosevi.   ATORVASTATIN  (LIPITOR ) 80 MG TABLET    TAKE ONE TABLET BY MOUTH ONCE DAILY.   CONTINUOUS GLUCOSE SENSOR (FREESTYLE LIBRE 2 SENSOR) MISC    REPLACE SENSOR EVERY 14 DAYS   EMPAGLIFLOZIN  (JARDIANCE ) 25 MG TABS TABLET    Take 25 mg by mouth daily.   HYDROCODONE -ACETAMINOPHEN  (NORCO/VICODIN) 5-325 MG TABLET    as needed.   INSULIN  ASPART PROTAMINE- ASPART (NOVOLOG  MIX 70/30) (70-30) 100 UNIT/ML INJECTION    as needed.   INSULIN  GLARGINE (LANTUS ) 100 UNIT/ML SOPN    Inject 38 Units into the skin at bedtime.   INSULIN  NPH-INSULIN  REGULAR (NOVOLIN 70/30) (70-30) 100 UNIT/ML INJECTION    Inject 10-25 Units into the skin daily as needed (CBG >180).   METOPROLOL  SUCCINATE  (TOPROL -XL) 25 MG 24 HR TABLET    Take 1 tablet (25 mg total) by mouth daily.   NITROGLYCERIN  (NITROSTAT ) 0.4 MG SL TABLET    Place 1 tablet (0.4 mg total) under the tongue every 5 (five) minutes as needed.   SACUBITRIL -VALSARTAN  (ENTRESTO ) 49-51 MG    Take 1 tablet by mouth 2 (two) times daily.   SOFOSBUV-VELPATASV-VOXILAPREV (VOSEVI) 400-100-100 MG TABS    Take 1 tablet by mouth daily with lunch.   TETRAHYDROZOLINE (VISINE) 0.05 % OPHTHALMIC SOLUTION    as needed.   TRAMADOL (ULTRAM) 50 MG TABLET    as needed.  Modified Medications   No medications on file  Discontinued Medications   No medications on file    Labs: Hepatitis C Lab Results  Component Value Date   HCVGENOTYPE 1a 03/17/2024   HCVRNAPCRQN 4,980,000 (H) 03/17/2024   FIBROSTAGE F3 03/17/2024   Hepatitis B Lab Results  Component Value Date   HEPBSAB REACTIVE (A) 03/17/2024   HEPBSAG NON-REACTIVE 03/17/2024   HEPBCAB NON-REACTIVE 03/17/2024   Hepatitis A Lab Results  Component Value Date   HAV REACTIVE (A) 03/17/2024   HIV Lab Results  Component Value Date   HIV NON-REACTIVE 03/17/2024   Lab Results  Component Value Date   CREATININE 1.27 03/17/2024   CREATININE 1.22 06/09/2023   CREATININE 1.21 08/19/2022   CREATININE 1.27 (H) 03/14/2022   CREATININE 1.28 (H) 12/25/2021   Lab Results  Component Value Date   AST  31 03/17/2024   AST 35 06/09/2023   AST 43 (H) 08/19/2022   ALT 26 03/17/2024   ALT 25 03/17/2024   ALT 35 06/09/2023   INR 1.0 03/17/2024   INR 0.93 11/10/2014    Assessment: Tobie is here today to follow up for his chronic Hepatitis C infection. He was previously treated for Hepatitis C years ago and cannot recall the medication he was prescribed. Unsure if he was ever cured. Therefore, he is considered treatment experienced and started Vosevi for 12 weeks on ***. He is taking it every day and has missed *** doses. No side effects or concerns. He takes it at *** every day. He picked up  his 2nd month on 05/20/24. Reminded him that he will need 3 months total of treatment.   Labs: Hep C RNA today; LFTs were WNL when starting treatment and will check them again today  Eligible vaccinations: Annual influenza and COVID vaccines; *** these today   Plan: - Continue Vosevi x 12 weeks - Hep C RNA + CMP today - Follow up with Dr. Dea on ***   Demarqus Jocson L. Emmanuell Kantz, PharmD, BCIDP, AAHIVP, CPP Clinical Pharmacist Practitioner - Infectious Diseases Clinical Pharmacist Lead - Specialty Pharmacy [facility] for Infectious Disease

## 2024-05-30 ENCOUNTER — Ambulatory Visit: Admitting: Pharmacist

## 2024-05-30 DIAGNOSIS — B1920 Unspecified viral hepatitis C without hepatic coma: Secondary | ICD-10-CM

## 2024-06-08 ENCOUNTER — Other Ambulatory Visit (HOSPITAL_COMMUNITY): Payer: Self-pay

## 2024-06-09 ENCOUNTER — Telehealth: Payer: Self-pay

## 2024-06-09 ENCOUNTER — Other Ambulatory Visit: Payer: Self-pay

## 2024-06-09 NOTE — Telephone Encounter (Signed)
 Patient called, he did not realize the appointment he missed on 11/10 was for a one month follow up. He misunderstood and thought this was a pick-up appointment with his pharmacy.   Rescheduled for 12/2. He reports headaches since starting Vosevi.   Nneoma Harral, BSN, RN

## 2024-06-13 ENCOUNTER — Other Ambulatory Visit: Payer: Self-pay

## 2024-06-13 NOTE — Progress Notes (Signed)
 Specialty Pharmacy Refill Coordination Note  Jose Clark is a 73 y.o. male contacted today regarding refills of specialty medication(s) Sofosbuv-Velpatasv-Voxilaprev (Vosevi )   Patient requested [name] at [facility] at [place] date: 06/14/24   Medication will be filled on: 06/13/24

## 2024-06-17 ENCOUNTER — Other Ambulatory Visit (HOSPITAL_COMMUNITY): Payer: Self-pay

## 2024-06-20 ENCOUNTER — Encounter: Admitting: Nutrition

## 2024-06-20 NOTE — Progress Notes (Unsigned)
 HPI: Jose Clark is a 73 y.o. male who presents to the [facility] pharmacy clinic for Hepatitis C follow-up.  Medication: Vosevi  x 12 weeks  Start Date: 04/26/24  Hepatitis C Genotype: 1a  Fibrosis Score: F3  Hepatitis C RNA: 4.9 million on 03/17/24  Referring ID Provider: Dr. Dea   Patient Active Problem List   Diagnosis Date Noted   Health care maintenance 03/19/2024   Need for hepatitis B screening test 03/19/2024   Screening for HIV (human immunodeficiency virus) 03/19/2024   Hypoglycemia 08/19/2022   Combined systolic and diastolic congestive heart failure /EF 30-35 %/G1DD 08/19/2022   ICD (implantable cardioverter-defibrillator) in place 04/22/2022   Ischemic cardiomyopathy 12/20/2021   Chronic systolic heart failure (HCC) 12/20/2021   NSTEMI (non-ST elevated myocardial infarction) (HCC) 08/03/2021   Diabetes (HCC) 11/10/2014   Hepatitis C 11/10/2014    Patient's Medications  New Prescriptions   No medications on file  Previous Medications   ASPIRIN  EC 81 MG TABLET    Take 1 tablet (81 mg total) by mouth daily. Swallow whole.   ATORVASTATIN  (LIPITOR ) 20 MG TABLET    Take 1 tablet (20 mg) daily while taking Vosevi  for 3 months. HOLD atorvastatin  80 mg while taking Vosevi .   ATORVASTATIN  (LIPITOR ) 80 MG TABLET    TAKE ONE TABLET BY MOUTH ONCE DAILY.   CONTINUOUS GLUCOSE SENSOR (FREESTYLE LIBRE 2 SENSOR) MISC    REPLACE SENSOR EVERY 14 DAYS   EMPAGLIFLOZIN  (JARDIANCE ) 25 MG TABS TABLET    Take 25 mg by mouth daily.   HYDROCODONE -ACETAMINOPHEN  (NORCO/VICODIN) 5-325 MG TABLET    as needed.   INSULIN  ASPART PROTAMINE- ASPART (NOVOLOG  MIX 70/30) (70-30) 100 UNIT/ML INJECTION    as needed.   INSULIN  GLARGINE (LANTUS ) 100 UNIT/ML SOPN    Inject 38 Units into the skin at bedtime.   INSULIN  NPH-INSULIN  REGULAR (NOVOLIN 70/30) (70-30) 100 UNIT/ML INJECTION    Inject 10-25 Units into the skin daily as needed (CBG >180).   METOPROLOL  SUCCINATE (TOPROL -XL) 25 MG 24 HR TABLET     Take 1 tablet (25 mg total) by mouth daily.   NITROGLYCERIN  (NITROSTAT ) 0.4 MG SL TABLET    Place 1 tablet (0.4 mg total) under the tongue every 5 (five) minutes as needed.   SACUBITRIL -VALSARTAN  (ENTRESTO ) 49-51 MG    Take 1 tablet by mouth 2 (two) times daily.   SOFOSBUV-VELPATASV-VOXILAPREV (VOSEVI ) 400-100-100 MG TABS    Take 1 tablet by mouth daily with lunch.   TETRAHYDROZOLINE (VISINE) 0.05 % OPHTHALMIC SOLUTION    as needed.   TRAMADOL (ULTRAM) 50 MG TABLET    as needed.  Modified Medications   No medications on file  Discontinued Medications   No medications on file    Labs: Hepatitis C Lab Results  Component Value Date   HCVGENOTYPE 1a 03/17/2024   HCVRNAPCRQN 4,980,000 (H) 03/17/2024   FIBROSTAGE F3 03/17/2024   Hepatitis B Lab Results  Component Value Date   HEPBSAB REACTIVE (A) 03/17/2024   HEPBSAG NON-REACTIVE 03/17/2024   HEPBCAB NON-REACTIVE 03/17/2024   Hepatitis A Lab Results  Component Value Date   HAV REACTIVE (A) 03/17/2024   HIV Lab Results  Component Value Date   HIV NON-REACTIVE 03/17/2024   Lab Results  Component Value Date   CREATININE 1.27 03/17/2024   CREATININE 1.22 06/09/2023   CREATININE 1.21 08/19/2022   CREATININE 1.27 (H) 03/14/2022   CREATININE 1.28 (H) 12/25/2021   Lab Results  Component Value Date   AST 31 03/17/2024  AST 35 06/09/2023   AST 43 (H) 08/19/2022   ALT 26 03/17/2024   ALT 25 03/17/2024   ALT 35 06/09/2023   INR 1.0 03/17/2024   INR 0.93 11/10/2014    Assessment: Jose Clark is here today to follow up for his Hepatitis C infection. He was previously treated with Harvoni but is unsure if he was ever cured. Due to this, we elected to treat him with Vosevi  for 12 weeks. He has filled all three months (04/22/24; 04/3124; 06/17/24). He is tolerating Vosevi  well except for occasional headaches. They are not every day and he does not feel the need to take any pain relievers for them as they resolve on their own. No missed  doses or issues. He takes it with food every morning. Will get updated lab work on him today and have him see Dr. Dea in a month for end of treatment follow up.   Labs: HCV RNA today  Eligible vaccinations: Declines flu vaccine today   Plan: - Continue Vosevi  for a total of 12 weeks - HCV RNA today - Follow up with Dr. Dea on 08/02/24 for end of treatment visit  Jaythen Hamme L. Aracelly Tencza, PharmD, BCIDP, AAHIVP, CPP Clinical Pharmacist Practitioner - Infectious Diseases Clinical Pharmacist Lead - Specialty Pharmacy [facility] for Infectious Disease

## 2024-06-21 ENCOUNTER — Other Ambulatory Visit: Payer: Self-pay

## 2024-06-21 ENCOUNTER — Ambulatory Visit: Admitting: Pharmacist

## 2024-06-21 DIAGNOSIS — B1920 Unspecified viral hepatitis C without hepatic coma: Secondary | ICD-10-CM

## 2024-06-23 ENCOUNTER — Ambulatory Visit (INDEPENDENT_AMBULATORY_CARE_PROVIDER_SITE_OTHER)

## 2024-06-23 ENCOUNTER — Encounter (INDEPENDENT_AMBULATORY_CARE_PROVIDER_SITE_OTHER): Payer: Self-pay

## 2024-06-23 VITALS — BP 129/82 | HR 77 | Temp 98.0°F | Wt 198.0 lb

## 2024-06-23 DIAGNOSIS — H6122 Impacted cerumen, left ear: Secondary | ICD-10-CM

## 2024-06-23 DIAGNOSIS — H9313 Tinnitus, bilateral: Secondary | ICD-10-CM

## 2024-06-23 LAB — HEPATITIS C RNA QUANTITATIVE
HCV Quantitative Log: <1.18 {Log_IU}/mL
HCV RNA, PCR, QN: <15 [IU]/mL

## 2024-06-23 NOTE — Progress Notes (Signed)
 HPI:   Discussed the use of AI scribe software for clinical note transcription with the patient, who gave verbal consent to proceed.  History of Present Illness Jose Clark is a 73 year old male who presents with worsening bilateral tinnitus.  He has experienced ringing in both ears for approximately fifty years, which he believes began during his time in the [employer]. The tinnitus is persistent and has worsened over time, although he cannot pinpoint when the worsening began. The ringing is constant and does not fluctuate throughout the day.  No associated ear pain or drainage. His ears were cleaned last month at a doctor's office, where it was noted that one ear had a significant amount of wax, while the other was clear. He has no history of ear infections or ear surgeries.  He underwent a hearing test at the [facility] about three months ago, but he is unsure of the results. He has not been informed of any hearing loss, but he is concerned about the persistent tinnitus.    PMH/Meds/All/SocHx/FamHx/ROS: Past Medical History:  Diagnosis Date   AICD (automatic cardioverter/defibrillator) present    CAD (coronary artery disease)    a. s/p STEMI in 07/2021 with DES to proximal LAD and RCA was occluded with L--> R collaterals   Chronic kidney disease, stage 3a (HCC)    Diabetes (HCC)    x 20 yrs   Diabetes mellitus    Heart disease    Hepatitis C    HFrEF (heart failure with reduced ejection fraction) (HCC)    a. EF 30-35% in 07/2021 and 10/2021, s/p ICD placement in 01/2022   Ischemic cardiomyopathy    Mitral regurgitation    Tobacco abuse    Past Surgical History:  Procedure Laterality Date   CARDIAC DEFIBRILLATOR PLACEMENT Left 01/20/2022   CIRCUMCISION     30 years ago   COLONOSCOPY N/A 01/24/2016   Procedure: COLONOSCOPY;  Surgeon: Claudis RAYMOND Rivet, MD;  Location: [facility];  Service: Endoscopy;  Laterality: N/A;  1130   CORONARY/GRAFT ACUTE MI REVASCULARIZATION N/A 08/03/2021    Procedure: Coronary/Graft Acute MI Revascularization;  Surgeon: Verlin Lonni BIRCH, MD;  Location: [facility] INVASIVE CV LAB;  Service: Cardiovascular;  Laterality: N/A;   LEFT HEART CATH AND CORONARY ANGIOGRAPHY N/A 08/03/2021   Procedure: LEFT HEART CATH AND CORONARY ANGIOGRAPHY;  Surgeon: Verlin Lonni BIRCH, MD;  Location: [facility] INVASIVE CV LAB;  Service: Cardiovascular;  Laterality: N/A;   SUBQ ICD IMPLANT N/A 01/20/2022   Procedure: SUBQ ICD IMPLANT;  Surgeon: Fernande Elspeth BROCKS, MD;  Location: [facility] INVASIVE CV LAB;  Service: Cardiovascular;  Laterality: N/A;   No family history of bleeding disorders, wound healing problems or difficulty with anesthesia.  Social Connections: Not on file    Current Outpatient Medications:    aspirin  EC 81 MG tablet, Take 1 tablet (81 mg total) by mouth daily. Swallow whole., Disp: , Rfl:    atorvastatin  (LIPITOR ) 20 MG tablet, Take 1 tablet (20 mg) daily while taking Vosevi  for 3 months. HOLD atorvastatin  80 mg while taking Vosevi ., Disp: 90 tablet, Rfl: 0   Continuous Glucose Sensor (FREESTYLE LIBRE 2 SENSOR) MISC, REPLACE SENSOR EVERY 14 DAYS, Disp: , Rfl:    empagliflozin  (JARDIANCE ) 25 MG TABS tablet, Take 25 mg by mouth daily., Disp: , Rfl:    HYDROcodone -acetaminophen  (NORCO/VICODIN) 5-325 MG tablet, as needed., Disp: , Rfl:    insulin  aspart protamine- aspart (NOVOLOG  MIX 70/30) (70-30) 100 UNIT/ML injection, as needed., Disp: , Rfl:  insulin  glargine (LANTUS ) 100 unit/mL SOPN, Inject 38 Units into the skin at bedtime., Disp: 15 mL, Rfl: 1   insulin  NPH-insulin  regular (NOVOLIN 70/30) (70-30) 100 UNIT/ML injection, Inject 10-25 Units into the skin daily as needed (CBG >180)., Disp: , Rfl:    metoprolol  succinate (TOPROL -XL) 25 MG 24 hr tablet, Take 1 tablet (25 mg total) by mouth daily., Disp: 90 tablet, Rfl: 3   nitroGLYCERIN  (NITROSTAT ) 0.4 MG SL tablet, Place 1 tablet (0.4 mg total) under the tongue every 5 (five) minutes as needed., Disp: 25 tablet,  Rfl: 7   sacubitril -valsartan  (ENTRESTO ) 49-51 MG, Take 1 tablet by mouth 2 (two) times daily., Disp: 180 tablet, Rfl: 3   Sofosbuv-Velpatasv-Voxilaprev (VOSEVI ) 400-100-100 MG TABS, Take 1 tablet by mouth daily with lunch., Disp: 28 tablet, Rfl: 2   tetrahydrozoline (VISINE) 0.05 % ophthalmic solution, as needed., Disp: , Rfl:    traMADol (ULTRAM) 50 MG tablet, as needed., Disp: , Rfl:    atorvastatin  (LIPITOR ) 80 MG tablet, TAKE ONE TABLET BY MOUTH ONCE DAILY. (Patient not taking: Reported on 06/23/2024), Disp: 90 tablet, Rfl: 3 A complete ROS was performed with pertinent positives/negatives noted in the HPI. The remainder of the ROS are negative.   Physical Exam:  BP 129/82 (BP Location: Left Arm, Patient Position: Sitting, Cuff Size: Normal)   Pulse 77   Temp 98 F (36.7 C)   Wt 198 lb (89.8 kg)   SpO2 92%   BMI 27.62 kg/m  General: Well developed, well nourished. No acute distress. Head/Face: Normocephalic. No sinus tenderness. Facial nerve intact and equal bilaterally. No facial lacerations. Eyes: PERRL, no scleral icterus or conjunctival hemorrhage. EOMI. Ears: No gross deformity. Normal external canal. Tympanic membrane in tact bilaterally Hearing: Normal speech reception.  Nose: No gross deformity or lesions. No purulent discharge. No turbinate hypertrophy. Mouth/Oropharynx: Lips without any lesions. No mucosal lesions within the oropharynx. No tonsillar enlargement, exudate, or lesions. Pharyngeal walls symmetrical. Uvula midline. Tongue midline without lesions. Larynx: See TFL if applicable Nasopharynx: See TFL if applicable Neck: Trachea midline. No masses. No thyromegaly or nodules palpated. No crepitus. Lymphatic: No lymphadenopathy in the neck. Respiratory: No stridor or distress. Room air. Cardiovascular: Regular rate and rhythm. Extremities: No edema or cyanosis. Warm and well-perfused. Skin: No scars or lesions on face or neck. Neurologic: CN II-XII grossly intact.  Moving all extremities without gross abnormality. Other:  Independent Review of Additional Tests or Records: None Procedures: Procedure: bilateral ear microscopy and cerumen removal using microscope (CPT [id number]) - Mod 25 Pre-procedure diagnosis: unilateral cerumen impaction left external auditory canal Post-procedure diagnosis: same Indication: left cerumen impaction; given patient's otologic complaints and history as well as for improved and comprehensive examination of external ear and tympanic membrane, bilateral otologic examination using microscope was performed and impacted cerumen removed  Procedure: Patient was placed semi-recumbent. Both ear canals were examined using the microscope with findings above. Cerumen removed from the left external auditory canal using suction and currette with improvement in EAC examination and patency. Left: EAC was patent. TM was intact . Middle ear was aerated. Drainage: none Right: EAC was patent. TM was intact . Middle ear was aerated . Drainage: none Patient tolerated the procedure well.    Impression & Plans: Assessment & Plan  Bilateral tinnitus Chronic bilateral tinnitus for 50 years, recently worsened. Likely related to hearing loss. - Advised obtaining hearing test results from the [facility] to assess for hearing loss. - Recommended using white noise or background noise at night  to help mask tinnitus. - Discussed potential use of hearing aids with a masker if hearing loss is confirmed. - Will require hearing aid evaluation at a hearing aid provider once patient obtains hearing test  Impacted cerumen, left ear Impacted cerumen in the left ear contributing to potential hearing issues. Recent ear cleaning was incomplete. - Cleaned out impacted cerumen from the left ear today   Follow-up as needed.    Adah Malkin, DO [facility]

## 2024-07-04 NOTE — Progress Notes (Unsigned)
 Cardiology Office Note    Date:  07/06/2024  ID:  Jose Clark, DOB [birth year], MRN [id number] Cardiologist: Alvan Carrier, MD Electrophysiologist:  Elspeth Sage, MD (Inactive) { :  History of Present Illness:    Jose Clark is a 73 y.o. male with past medical history of CAD (s/p STEMI in 07/2021 with DES to proximal LAD and RCA was occluded with L--> R collaterals), chronic HFrEF (EF 30-35% in 07/2021 and 10/2021, s/p ICD placement in 01/2022), HTN, HLD, Type 2 DM, Stage 3 CKD, Hepatitis C and tobacco use who presents to the office today for overdue 23-month follow-up.  He was examined by myself in 08/2023 and denied any recent anginal symptoms at that time and appeared euvolemic by examination. He was overdue for follow-up with EP in regards to his ICD device interrogation and this was arranged. He was continued on ASA 81 mg daily, Atorvastatin  80 mg daily, Jardiance  25 mg daily and Entresto  49-51 mg twice daily. He had been off Toprol -XL for unclear reasons and this was restarted at 25 mg daily. He was no longer on Spironolactone  due to dizziness and orthostasis with the medication.  He was evaluated by Jodie Passey, PA in 12/2023 and was overall doing well and denied any recent symptoms. His ICD device was functioning normally and no changes were made. Was encouraged to follow-up with the EP in 12 months.  In talking with the patient today, he reports feeling great since his last office visit. He denies any recent chest pain or dyspnea on exertion. No specific orthopnea, PND or pitting edema. He does not exercise routinely but is planning to rejoin the [facility] Says that he does not follow a heart healthy diet as he consumes fast food and fried foods routinely.  Studies Reviewed:   EKG: EKG is ordered today and demonstrates:   EKG Interpretation Date/Time:  Wednesday July 06 2024 10:35:31 EST Ventricular Rate:  92 PR Interval:  164 QRS Duration:  90 QT Interval:  372 QTC  Calculation: 460 R Axis:   17  Text Interpretation: Normal sinus rhythm Incomplete RBBB Confirmed by Johnson Grate ([id number]) on 07/06/2024 10:45:50 AM       Limited Echo: 01/2023 IMPRESSIONS     1. Left ventricular ejection fraction, by estimation, is 30 to 35%. The  left ventricle has moderately decreased function. The left ventricle  demonstrates global hypokinesis.   2. Right ventricular systolic function is normal. The right ventricular  size is normal.   3. Limited echo evaluate LV function    Physical Exam:   VS:  BP 112/60 (BP Location: Left Arm, Cuff Size: Normal)   Pulse 92   Ht 5' 11 (1.803 m)   Wt 202 lb (91.6 kg)   SpO2 100%   BMI 28.17 kg/m    Wt Readings from Last 3 Encounters:  07/06/24 202 lb (91.6 kg)  06/23/24 198 lb (89.8 kg)  03/17/24 191 lb (86.6 kg)     GEN: Well nourished, well developed male appearing in no acute distress NECK: No JVD; No carotid bruits CARDIAC: RRR, no murmurs, rubs, gallops RESPIRATORY:  Clear to auscultation without rales, wheezing or rhonchi  ABDOMEN: Appears non-distended. No obvious abdominal masses. EXTREMITIES: No clubbing or cyanosis. No pitting edema.  Distal pedal pulses are 2+ bilaterally.   Assessment and Plan:   1. Coronary artery disease involving native coronary artery of native heart without angina pectoris - He previously had a STEMI in 07/2021 with DES to the proximal  LAD and the RCA was occluded with collaterals noted. He remains active at baseline denies any recent anginal symptoms. Continue ASA 81 mg daily and Toprol -XL 25 mg daily. Would recommend titration of statin as discussed below once he has completed treatment for Hepatitis C.  2. Chronic HFrEF (heart failure with reduced ejection fraction) (HCC) - He has a known cardiomyopathy and EF was at 30 to 35% by most recent echocardiogram in 01/2023. He denies any recent respiratory issues and appears euvolemic by examination today.   - Continue  current medical therapy with Jardiance  25 mg daily (on this dose for diabetes as well), Toprol -XL 25 mg daily and Entresto  49-51 mg twice daily. He was previously intolerant to Spironolactone .  3. ICD (implantable cardioverter-defibrillator) in place - He does have an ICD in place and this was functioning normally by most recent device interrogation in 03/2024.  4. HTN - His blood pressure is well-controlled at 112/60 during today's visit. Continue current medical therapy with Toprol -XL 25 mg daily and Entresto  49-51 mg twice daily.  5. Hyperlipidemia LDL goal <70 - His LDL was elevated to 112 when checked in 04/2024 but previously at 67 when checked earlier this year. Atorvastatin  dosing was reduced from 80 mg daily to 20 mg daily given his treatment for Hepatitis C. Would resume 80 mg daily once able and aim for goal LDL less than 70.  6. CKD stage 3a, GFR 45-59 ml/min (HCC) - His creatinine was stable at 1.27 when checked in 02/2024 which is close to his known baseline.   Signed, Laymon CHRISTELLA Qua, PA-C

## 2024-07-06 ENCOUNTER — Encounter: Payer: Self-pay | Admitting: Student

## 2024-07-06 ENCOUNTER — Ambulatory Visit: Attending: Student | Admitting: Student

## 2024-07-06 VITALS — BP 112/60 | HR 92 | Ht 71.0 in | Wt 202.0 lb

## 2024-07-06 DIAGNOSIS — E785 Hyperlipidemia, unspecified: Secondary | ICD-10-CM | POA: Diagnosis not present

## 2024-07-06 DIAGNOSIS — I10 Essential (primary) hypertension: Secondary | ICD-10-CM | POA: Diagnosis not present

## 2024-07-06 DIAGNOSIS — Z95810 Presence of automatic (implantable) cardiac defibrillator: Secondary | ICD-10-CM | POA: Diagnosis not present

## 2024-07-06 DIAGNOSIS — I2510 Atherosclerotic heart disease of native coronary artery without angina pectoris: Secondary | ICD-10-CM

## 2024-07-06 DIAGNOSIS — N1831 Chronic kidney disease, stage 3a: Secondary | ICD-10-CM | POA: Diagnosis not present

## 2024-07-06 DIAGNOSIS — I5020 Unspecified systolic (congestive) heart failure: Secondary | ICD-10-CM

## 2024-07-06 NOTE — Patient Instructions (Signed)
 Medication Instructions:  Your physician recommends that you continue on your current medications as directed. Please refer to the Current Medication list given to you today.  *If you need a refill on your cardiac medications before your next appointment, please call your pharmacy*  Lab Work: NONE   If you have labs (blood work) drawn today and your tests are completely normal, you will receive your results only by: MyChart Message (if you have MyChart) OR A paper copy in the mail If you have any lab test that is abnormal or we need to change your treatment, we will call you to review the results.  Testing/Procedures: NONE   Follow-Up: At [facility], you and your health needs are our priority.  As part of our continuing mission to provide you with exceptional heart care, our providers are all part of one team.  This team includes your primary Cardiologist (physician) and Advanced Practice Providers or APPs (Physician Assistants and Nurse Practitioners) who all work together to provide you with the care you need, when you need it.  Your next appointment:   6 month(s)  Provider:   Dorn Ross, MD or Laymon Qua, PA-C    We recommend signing up for the patient portal called MyChart.  Sign up information is provided on this After Visit Summary.  MyChart is used to connect with patients for Virtual Visits (Telemedicine).  Patients are able to view lab/test results, encounter notes, upcoming appointments, etc.  Non-urgent messages can be sent to your provider as well.   To learn more about what you can do with MyChart, go to [email or web address].   Other Instructions Thank you for choosing [facility]!

## 2024-07-07 ENCOUNTER — Other Ambulatory Visit: Payer: Self-pay

## 2024-07-07 ENCOUNTER — Other Ambulatory Visit: Payer: Self-pay | Admitting: Pharmacy Technician

## 2024-07-25 ENCOUNTER — Emergency Department (HOSPITAL_COMMUNITY)
Admission: EM | Admit: 2024-07-25 | Discharge: 2024-07-25 | Disposition: A | Discharge status: Home / Self Care | Attending: Emergency Medicine | Admitting: Emergency Medicine

## 2024-07-25 ENCOUNTER — Encounter: Admitting: Nutrition

## 2024-07-25 ENCOUNTER — Ambulatory Visit

## 2024-07-25 ENCOUNTER — Other Ambulatory Visit: Payer: Self-pay

## 2024-07-25 ENCOUNTER — Encounter (HOSPITAL_COMMUNITY): Payer: Self-pay | Admitting: *Deleted

## 2024-07-25 DIAGNOSIS — M791 Myalgia, unspecified site: Secondary | ICD-10-CM | POA: Insufficient documentation

## 2024-07-25 DIAGNOSIS — N189 Chronic kidney disease, unspecified: Secondary | ICD-10-CM | POA: Diagnosis not present

## 2024-07-25 DIAGNOSIS — Z7982 Long term (current) use of aspirin: Secondary | ICD-10-CM | POA: Insufficient documentation

## 2024-07-25 DIAGNOSIS — R509 Fever, unspecified: Secondary | ICD-10-CM | POA: Diagnosis not present

## 2024-07-25 DIAGNOSIS — I2510 Atherosclerotic heart disease of native coronary artery without angina pectoris: Secondary | ICD-10-CM | POA: Insufficient documentation

## 2024-07-25 DIAGNOSIS — R059 Cough, unspecified: Secondary | ICD-10-CM | POA: Diagnosis not present

## 2024-07-25 DIAGNOSIS — R0981 Nasal congestion: Secondary | ICD-10-CM | POA: Diagnosis not present

## 2024-07-25 DIAGNOSIS — J111 Influenza due to unidentified influenza virus with other respiratory manifestations: Secondary | ICD-10-CM

## 2024-07-25 DIAGNOSIS — I5020 Unspecified systolic (congestive) heart failure: Secondary | ICD-10-CM

## 2024-07-25 DIAGNOSIS — Z794 Long term (current) use of insulin: Secondary | ICD-10-CM | POA: Diagnosis not present

## 2024-07-25 DIAGNOSIS — E1122 Type 2 diabetes mellitus with diabetic chronic kidney disease: Secondary | ICD-10-CM | POA: Insufficient documentation

## 2024-07-25 NOTE — Discharge Instructions (Addendum)
 Drink plenty of fluids.  Take acetaminophen  and/or ibuprofen as needed for fever or aching.  Please be aware that if you combine acetaminophen  and ibuprofen, you will get better pain and fever relief and you get from taking either medication by itself.  He may continue to take over-the-counter cough and cold medication as needed.  Once you have gotten over this infection, please consider getting the influenza immunization for the season.  Please get the influenza immunization every year before the flu epidemic begins.

## 2024-07-25 NOTE — ED Triage Notes (Signed)
 Pt c/o generalized body aches, cough, nasal congestion and chills x 4 days  Pt states he has been taking OTC meds with no relief  Pt coughing up white sputum

## 2024-07-25 NOTE — ED Provider Notes (Signed)
 " [place] AT [facility] Provider Note   CSN: [id number] Arrival date & time: 07/25/24  0544     Patient presents with: Generalized Body Aches   Jose Clark is a 74 y.o. male.   The history is provided by the patient.   He has history of diabetes, heart failure with reduced ejection fraction, coronary artery disease, chronic kidney disease and comes in because of nasal congestion, cough, subjective fever, chills, sweats for the last 4 days.  Cough is productive of a small amount of white sputum.  Nasal mucus is a pale yellow.  He denies vomiting or diarrhea but does endorse myalgias.  He has been taking over-the-counter cough and cold medication without relief.  He denies any sick contacts.  He did not receive the influenza immunization this flu season.    Prior to Admission medications  Medication Sig Start Date End Date Taking? Authorizing Provider  aspirin  EC 81 MG tablet Take 1 tablet (81 mg total) by mouth daily. Swallow whole. 09/02/23   Strader, Laymon HERO, PA-C  atorvastatin  (LIPITOR ) 20 MG tablet Take 1 tablet (20 mg) daily while taking Vosevi  for 3 months. HOLD atorvastatin  80 mg while taking Vosevi . 04/21/24   Waddell Alan PARAS, RPH-CPP  atorvastatin  (LIPITOR ) 80 MG tablet TAKE ONE TABLET BY MOUTH ONCE DAILY. Patient not taking: Reported on 07/06/2024 03/28/24   Johnson, Laymon HERO, PA-C  Continuous Glucose Sensor (FREESTYLE LIBRE 2 SENSOR) MISC REPLACE SENSOR EVERY 14 DAYS 10/27/23   [provider]  empagliflozin  (JARDIANCE ) 25 MG TABS tablet Take 25 mg by mouth daily.    [provider]  HYDROcodone -acetaminophen  (NORCO/VICODIN) 5-325 MG tablet as needed.    [provider]  insulin  aspart protamine- aspart (NOVOLOG  MIX 70/30) (70-30) 100 UNIT/ML injection as needed. 08/11/17   [provider]  insulin  glargine (LANTUS ) 100 unit/mL SOPN Inject 38 Units into the skin at bedtime. 08/19/22   Pearlean Manus, MD   insulin  NPH-insulin  regular (NOVOLIN 70/30) (70-30) 100 UNIT/ML injection Inject 10-25 Units into the skin daily as needed (CBG >180).    [provider]  metoprolol  succinate (TOPROL -XL) 25 MG 24 hr tablet Take 1 tablet (25 mg total) by mouth daily. 09/02/23   Strader, Laymon HERO, PA-C  nitroGLYCERIN  (NITROSTAT ) 0.4 MG SL tablet Place 1 tablet (0.4 mg total) under the tongue every 5 (five) minutes as needed. 01/27/24   Strader, Laymon HERO, PA-C  sacubitril -valsartan  (ENTRESTO ) 49-51 MG Take 1 tablet by mouth 2 (two) times daily. 09/02/23   Strader, Laymon HERO, PA-C  Sofosbuv-Velpatasv-Voxilaprev (VOSEVI ) 400-100-100 MG TABS Take 1 tablet by mouth daily with lunch. 04/20/24   Waddell Alan PARAS, RPH-CPP  tetrahydrozoline (VISINE) 0.05 % ophthalmic solution as needed.    [provider]  traMADol (ULTRAM) 50 MG tablet as needed. 07/28/23   [provider]    Allergies: Patient has no known allergies.    Review of Systems  All other systems reviewed and are negative.   Updated Vital Signs BP (!) 138/98 (BP Location: Left Arm)   Pulse 72   Temp 98.4 F (36.9 C) (Oral)   Resp 18   Ht 5' 11 (1.803 m)   Wt 91.6 kg   SpO2 97%   BMI 28.17 kg/m   Physical Exam Vitals and nursing note reviewed.   74 year old male, resting comfortably and in no acute distress. Vital signs are significant for mildly elevated blood pressure. Oxygen saturation is 97%, which is normal.  Head is normocephalic and atraumatic. PERRLA, EOMI. Oropharynx is clear.  There is no sinus tenderness. Neck is nontender and supple without adenopathy. Lungs are clear without rales, wheezes, or rhonchi. Heart has regular rate and rhythm without murmur. Abdomen is soft, flat, nontender. Extremities have no cyanosis or edema, full range of motion is present. Skin is warm and dry without rash. Neurologic: Awake and alert, moves all extremities equally.    Procedures   Medications Ordered in the ED - No  data to display                                  Medical Decision Making  Influenza-like illness.  Unfortunately, he is outside the treatment window for initiating antiviral medication for influenza.  I have advised the patient of this and recommended he continue symptomatic treatment.     Final diagnoses:  Influenza-like illness    ED Discharge Orders     None          Raford Lenis, MD 07/25/24 [phone]  "

## 2024-07-26 ENCOUNTER — Ambulatory Visit: Payer: Self-pay | Admitting: Student in an Organized Health Care Education/Training Program

## 2024-07-26 LAB — CUP PACEART REMOTE DEVICE CHECK
Battery Remaining Percentage: 73 %
Battery Voltage: 73
Date Time Interrogation Session: 20260103115000
HighPow Impedance: 70 Ohm
Implantable Lead Connection Status: 753985
Implantable Lead Implant Date: 20230703
Implantable Lead Location: 753862
Implantable Lead Model: 3501
Implantable Lead Serial Number: 235028
Implantable Pulse Generator Implant Date: 20230703
Pulse Gen Serial Number: 174730

## 2024-07-28 NOTE — Progress Notes (Signed)
 Remote ICD Transmission

## 2024-08-02 ENCOUNTER — Encounter: Payer: Self-pay | Admitting: Infectious Diseases

## 2024-08-02 ENCOUNTER — Other Ambulatory Visit: Payer: Self-pay

## 2024-08-02 ENCOUNTER — Ambulatory Visit (INDEPENDENT_AMBULATORY_CARE_PROVIDER_SITE_OTHER): Admitting: Infectious Diseases

## 2024-08-02 VITALS — BP 129/82 | HR 88 | Temp 98.4°F | Ht 71.0 in | Wt 199.0 lb

## 2024-08-02 DIAGNOSIS — Z5181 Encounter for therapeutic drug level monitoring: Secondary | ICD-10-CM | POA: Insufficient documentation

## 2024-08-02 DIAGNOSIS — Z7185 Encounter for immunization safety counseling: Secondary | ICD-10-CM | POA: Insufficient documentation

## 2024-08-02 DIAGNOSIS — B1920 Unspecified viral hepatitis C without hepatic coma: Secondary | ICD-10-CM | POA: Diagnosis not present

## 2024-08-02 NOTE — Progress Notes (Signed)
 [facility]                                      [address], [place], [place], [address]                                               Phn. [phone]; Fax: [phone]                                                               Date: 08/03/23 Reason for Visit: Hepatitis C fu  HPI: Jose Clark is a 74 y.o.old male with h/o DM w neuropathy, HLD, HTN, CAD/Ischemic Cardiomyopathy s/p AICD, CKD, CHF, MR, tobacco abuse who is referred for evaluation and management of HCV.   He reports being previously treated for HCV but cannot recall if he was cured. He took medication years ago but cannot recall specifics about the treatment or medication taken.   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 Denies known sexual partners with hepatitis C, history of sharing needles, or blood transfusions. Denies known family history of liver disease or liver malignancy.   Denies any hospitalizations related to liver disease/cirrhosis, jaundice, GI bleeding, mental status changes, abdominal pain/distension, loss of appetite/weight, fatigue, edema and acholic/bloody/black stool.   Denies symptoms of extra GI manifestations like abnormal skin rash,diffuse joint pain/swelling.   He consumes alcohol moderately, with three to four beers per week. He lives with his daughter and is retired, occasionally working on the side.  1/13  Compliant with Voesvi as instructed. Denies missing doses or concerns. Last dose was last week. No other concerns.   ROS: as above   Current Outpatient Medications on File Prior to Visit  Medication Sig Dispense Refill   aspirin  EC 81 MG tablet Take 1 tablet (81 mg total) by mouth daily. Swallow whole.     atorvastatin  (LIPITOR ) 20 MG tablet Take 1 tablet (20 mg) daily while taking Vosevi  for 3 months. HOLD atorvastatin  80 mg while taking Vosevi . 90 tablet 0   atorvastatin   (LIPITOR ) 80 MG tablet TAKE ONE TABLET BY MOUTH ONCE DAILY. (Patient not taking: Reported on 07/06/2024) 90 tablet 3   Continuous Glucose Sensor (FREESTYLE LIBRE 2 SENSOR) MISC REPLACE SENSOR EVERY 14 DAYS     empagliflozin  (JARDIANCE ) 25 MG TABS tablet Take 25 mg by mouth daily.     HYDROcodone -acetaminophen  (NORCO/VICODIN) 5-325 MG tablet as needed.     insulin  aspart protamine- aspart (NOVOLOG  MIX 70/30) (70-30) 100 UNIT/ML injection as needed.     insulin  glargine (LANTUS ) 100 unit/mL SOPN Inject 38 Units into the skin at bedtime. 15 mL 1   insulin  NPH-insulin  regular (NOVOLIN 70/30) (70-30) 100 UNIT/ML injection Inject 10-25 Units into the skin daily as  needed (CBG >180).     metoprolol  succinate (TOPROL -XL) 25 MG 24 hr tablet Take 1 tablet (25 mg total) by mouth daily. 90 tablet 3   nitroGLYCERIN  (NITROSTAT ) 0.4 MG SL tablet Place 1 tablet (0.4 mg total) under the tongue every 5 (five) minutes as needed. 25 tablet 7   sacubitril -valsartan  (ENTRESTO ) 49-51 MG Take 1 tablet by mouth 2 (two) times daily. 180 tablet 3   Sofosbuv-Velpatasv-Voxilaprev (VOSEVI ) 400-100-100 MG TABS Take 1 tablet by mouth daily with lunch. 28 tablet 2   tetrahydrozoline (VISINE) 0.05 % ophthalmic solution as needed.     traMADol (ULTRAM) 50 MG tablet as needed.     No current facility-administered medications on file prior to visit.   No Known Allergies  Past Medical History:  Diagnosis Date   AICD (automatic cardioverter/defibrillator) present    CAD (coronary artery disease)    a. s/p STEMI in 07/2021 with DES to proximal LAD and RCA was occluded with L--> R collaterals   Chronic kidney disease, stage 3a (HCC)    Diabetes (HCC)    x 20 yrs   Diabetes mellitus    Heart disease    Hepatitis C    HFrEF (heart failure with reduced ejection fraction) (HCC)    a. EF 30-35% in 07/2021 and 10/2021, s/p ICD placement in 01/2022   Ischemic cardiomyopathy    Mitral regurgitation    Tobacco abuse    Past  Surgical History:  Procedure Laterality Date   CARDIAC DEFIBRILLATOR PLACEMENT Left 01/20/2022   CIRCUMCISION     30 years ago   COLONOSCOPY N/A 01/24/2016   Procedure: COLONOSCOPY;  Surgeon: Claudis RAYMOND Rivet, MD;  Location: [facility];  Service: Endoscopy;  Laterality: N/A;  1130   CORONARY/GRAFT ACUTE MI REVASCULARIZATION N/A 08/03/2021   Procedure: Coronary/Graft Acute MI Revascularization;  Surgeon: Verlin Lonni BIRCH, MD;  Location: [facility] INVASIVE CV LAB;  Service: Cardiovascular;  Laterality: N/A;   LEFT HEART CATH AND CORONARY ANGIOGRAPHY N/A 08/03/2021   Procedure: LEFT HEART CATH AND CORONARY ANGIOGRAPHY;  Surgeon: Verlin Lonni BIRCH, MD;  Location: [facility] INVASIVE CV LAB;  Service: Cardiovascular;  Laterality: N/A;   SUBQ ICD IMPLANT N/A 01/20/2022   Procedure: SUBQ ICD IMPLANT;  Surgeon: Fernande Elspeth BROCKS, MD;  Location: [facility] INVASIVE CV LAB;  Service: Cardiovascular;  Laterality: N/A;   Social History   Socioeconomic History   Marital status: Single    Spouse name: Not on file   Number of children: 1   Years of education: Not on file   Highest education level: High school graduate  Occupational History   Occupation: retired  Tobacco Use   Smoking status: Former    Current packs/day: 0.00    Average packs/day: 0.5 packs/day for 20.0 years (10.0 ttl pk-yrs)    Types: Cigarettes    Start date: 08/04/2000    Quit date: 08/04/2020    Years since quitting: 3.9   Smokeless tobacco: Never   Tobacco comments:    Quit upon admission to hospital.  Vaping Use   Vaping status: Never Used  Substance and Sexual Activity   Alcohol use: Yes    Alcohol/week: 7.0 standard drinks of alcohol    Types: 7 Cans of beer per week    Comment: occassionally  does not drink everyday. Occasional beer.   Drug use: Not Currently    Comment: cocaine   Sexual activity: Yes  Other Topics Concern   Not on file  Social History Narrative   Not on  file   Social Drivers of Health   Tobacco Use:  Medium Risk (07/25/2024)   Patient History    Smoking Tobacco Use: Former    Smokeless Tobacco Use: Never    Passive Exposure: Not on file  Financial Resource Strain: Low Risk (08/06/2021)   Overall Financial Resource Strain (CARDIA)    Difficulty of Paying Living Expenses: Not very hard  Food Insecurity: No Food Insecurity (08/06/2021)   Hunger Vital Sign    Worried About Running Out of Food in the Last Year: Never true    Ran Out of Food in the Last Year: Never true  Transportation Needs: No Transportation Needs (08/06/2021)   PRAPARE - Administrator, Civil Service (Medical): No    Lack of Transportation (Non-Medical): No  Physical Activity: Not on file  Stress: Not on file  Social Connections: Not on file  Intimate Partner Violence: Not on file  Depression [phone]): Low Risk (11/26/2021)   Depression (PHQ2-9)    PHQ-2 Score: 1  Alcohol Screen: Low Risk (08/06/2021)   Alcohol Screen    Last Alcohol Screening Score (AUDIT): 1  Housing: Low Risk (08/06/2021)   Housing    Last Housing Risk Score: 0  Utilities: Not on file  Health Literacy: Not on file   Family History  Problem Relation Age of Onset   Diabetes Mother    Dementia Mother    CVA Father    Vitals  There were no vitals taken for this visit.  Gen:  no acute distress HEENT: [place]/AT, no scleral icterus, no pale conjunctivae, hearing normal, oral mucosa moist Neck: Supple Cardio: Regular rate and rhythm, s1 and s2 Resp: Pulmonary effort normal on room air, Normal breath sounds.  GI: Soft, nontender, nondistended GU: MSK - no pedal edema Skin: No rashes, ICD site OK Neuro: Grossly non focal, awake, alert and oriented * 3 Psych: Calm, cooperative  Laboratory  7/23 HCV ab reactive, HCV RNA 9280000 international/ml  Assessment/Plan: # Chronic Hepatitis C, Treatment experienced - Per EMR, Prior Genotype 1A. Started Harvoni 07/24/2015.  He had a lapse in tx due to not calling pharmacy for refill on Harvoni. Hx  of tx failure due to depression.  - 11/14/2015 HCV RNA negative  _________________ - 04/26/24  Restarted on Vosevi  for 12 weeks  - 03/17/24 HCV RNA 4.9 million  - 06/21/24 HCV RNA negative, discussed  - Adherence assessed, side effects reviewed/discussed and DDIs reviewed   Plan  - HCV RNA today  - Fu after 12 weeks for HCV RNA   # HCM - He is immune to Hepatitis A and B  # Immunization counseling - Discussed to get flu vaccine today   I personally spent a total of 30 minutes in the care of the patient today including preparing to see the patient, getting/reviewing separately obtained history, performing a medically appropriate exam/evaluation, counseling and educating, placing orders, documenting clinical information in the EHR, independently interpreting results, and communicating results.  Electronically signed by: Annalee Orem, MD Infectious Diseases  Office phone [phone] Fax no. [phone]  "

## 2024-08-05 LAB — HEPATITIS C RNA QUANTITATIVE
HCV Quantitative Log: <1.18 {Log_IU}/mL
HCV RNA, PCR, QN: <15 [IU]/mL

## 2024-08-06 ENCOUNTER — Ambulatory Visit: Payer: Self-pay | Admitting: Infectious Diseases

## 2024-08-11 ENCOUNTER — Encounter: Payer: Self-pay | Admitting: Nutrition

## 2024-08-11 ENCOUNTER — Encounter: Admitting: Nutrition

## 2024-08-11 DIAGNOSIS — E1165 Type 2 diabetes mellitus with hyperglycemia: Secondary | ICD-10-CM | POA: Insufficient documentation

## 2024-08-11 DIAGNOSIS — Z713 Dietary counseling and surveillance: Secondary | ICD-10-CM | POA: Diagnosis not present

## 2024-08-11 NOTE — Progress Notes (Signed)
 Diabetes Self-Management Education  Visit Type:  Type 2 DM uncontrolled  Appt. Start Time: 0930 Appt. End Time: 1100  08/11/2024  Mr. Gilad Dugger, identified by name and date of birth, is a 74 y.o. male with a diagnosis of Diabetes:  SABRA Type 2  ASSESSMENT  Wt Readings from Last 3 Encounters:  08/02/24 199 lb (90.3 kg)  07/25/24 201 lb 15.1 oz (91.6 kg)  07/06/24 202 lb (91.6 kg)   Ht Readings from Last 3 Encounters:  08/02/24 5' 11 (1.803 m)  07/25/24 5' 11 (1.803 m)  07/06/24 5' 11 (1.803 m)   There is no height or weight on file to calculate BMI. @BMIFA @ Facility age limit for growth %iles is 20 years. Facility age limit for growth %iles is 20 years.  There were no vitals taken for this visit. There is no height or weight on file to calculate BMI.    Latest Ref Rng & Units 03/17/2024    2:17 PM 07/28/2023   12:00 AM 06/09/2023    4:21 PM  CMP  Glucose 65 - 99 mg/dL 839   741   BUN 7 - 25 mg/dL 20   37   Creatinine 9.29 - 1.28 mg/dL 8.72   8.77   Sodium 864 - 146 mmol/L 137   130   Potassium 3.5 - 5.3 mmol/L 4.7   4.1   Chloride 98 - 110 mmol/L 103   99   CO2 20 - 32 mmol/L 28   23   Calcium  8.6 - 10.3 mg/dL 9.2  9.1     9.1   Total Protein 6.1 - 8.1 g/dL 6.7   7.0   Total Bilirubin 0.2 - 1.2 mg/dL 0.5   1.2   Alkaline Phos 38 - 126 U/L   78   AST 10 - 35 U/L 31   35   ALT 9 - 46 U/L 9 - 46 U/L 26    25   35      This result is from an external source.   Past Medical History:  Diagnosis Date   AICD (automatic cardioverter/defibrillator) present    CAD (coronary artery disease)    a. s/p STEMI in 07/2021 with DES to proximal LAD and RCA was occluded with L--> R collaterals   Chronic kidney disease, stage 3a (HCC)    Diabetes (HCC)    x 20 yrs   Diabetes mellitus    Heart disease    Hepatitis C    HFrEF (heart failure with reduced ejection fraction) (HCC)    a. EF 30-35% in 07/2021 and 10/2021, s/p ICD placement in 01/2022   Ischemic cardiomyopathy     Mitral regurgitation    Tobacco abuse       Diabetes Self-Management Education - 08/11/24 1100       Complications   Fasting Blood glucose range (mg/dL) 29-870    Postprandial Blood glucose range (mg/dL) 819-799    Have you had a dilated eye exam in the past 12 months? No    Have you had a dental exam in the past 12 months? No    Are you checking your feet? No      Patient Education   Healthy Eating Reviewed blood glucose goals for pre and post meals and how to evaluate the patients' food intake on their blood glucose level.;Plate Method;Food label reading, portion sizes and measuring food.    Being Active Role of exercise on diabetes management, blood pressure control and cardiac health.  Medications Reviewed patients medication for diabetes, action, purpose, timing of dose and side effects.;Reviewed medication adjustment guidelines for hyperglycemia and sick days.    Monitoring Taught/evaluated CGM (comment);Purpose and frequency of SMBG.;Interpreting lab values - A1C, lipid, urine microalbumina.;Identified appropriate SMBG and/or A1C goals.;Yearly dilated eye exam;Daily foot exams    Acute complications Taught prevention, symptoms, and  treatment of hypoglycemia - the 15 rule.;Discussed and identified patients' prevention, symptoms, and treatment of hyperglycemia.    Chronic complications Relationship between chronic complications and blood glucose control    Diabetes Stress and Support Role of stress on diabetes    Lifestyle and Health Coping Lifestyle issues that need to be addressed for better diabetes care      Individualized Goals (developed by patient)   Nutrition Follow meal plan discussed;General guidelines for healthy choices and portions discussed    Physical Activity Exercise 5-7 days per week    Medications take my medication as prescribed    Monitoring  Consistenly use CGM    Problem Solving Eating Pattern;Medication consistency;Addressing barriers to behavior change     Reducing Risk do foot checks daily;examine blood glucose patterns;treat hypoglycemia with 15 grams of carbs if blood glucose less than 70mg /dL    Health Coping Ask for help with psychological, social, or emotional issues      Post-Education Assessment   Patient understands the diabetes disease and treatment process. Needs Review    Patient understands incorporating nutritional management into lifestyle. Needs Review    Patient undertands incorporating physical activity into lifestyle. Needs Review    Patient understands using medications safely. Needs Review    Patient understands monitoring blood glucose, interpreting and using results Needs Review    Patient understands prevention, detection, and treatment of acute complications. Needs Review    Patient understands prevention, detection, and treatment of chronic complications. Needs Review    Patient understands how to develop strategies to address psychosocial issues. Needs Review    Patient understands how to develop strategies to promote health/change behavior. Needs Review      Outcomes   Expected Outcomes Demonstrated interest in learning. Expect positive outcomes    Future DMSE 4-6 wks    Program Status Not Completed          Individualized Plan for Diabetes Self-Management Training:   Learning Objective:  Patient will have a greater understanding of diabetes self-management. Patient education plan is to attend individual and/or group sessions per assessed needs and concerns.   Plan:  Eat three meals per day at times discussed Cut out processed food, fast food and junk food Eat foods from garden Cut out milk and drink a gallon water  per day Cook meals at home. Stop using 70/30 insulin  and just use Lantus  daily and Jardiance  for diabetes. Call MD if blood sugars are lower than 70 mg/dl 3 times per week for insulin  adjustments or > 200 mg/dl 3 times per week.  Expected Outcomes:  Demonstrated interest in learning. Expect  positive outcomes  Education material provided: Food label handouts, My Plate, and Diabetes Resources  If problems or questions, patient to contact team via:  Phone and Email  Future DSME appointment: 4-6 wks

## 2024-09-07 ENCOUNTER — Encounter: Admitting: Nutrition

## 2024-10-24 ENCOUNTER — Ambulatory Visit

## 2024-11-09 ENCOUNTER — Ambulatory Visit: Payer: Self-pay | Admitting: Infectious Diseases

## 2025-01-23 ENCOUNTER — Ambulatory Visit

## 2025-04-24 ENCOUNTER — Ambulatory Visit
